# Patient Record
Sex: Female | Born: 1935
Health system: Southern US, Community
[De-identification: ages and names within clinical notes are randomized; demographics above are authoritative.]

## PROBLEM LIST (undated history)

## (undated) DIAGNOSIS — M1710 Unilateral primary osteoarthritis, unspecified knee: Secondary | ICD-10-CM

## (undated) DIAGNOSIS — E538 Deficiency of other specified B group vitamins: Secondary | ICD-10-CM

## (undated) DIAGNOSIS — M179 Osteoarthritis of knee, unspecified: Secondary | ICD-10-CM

## (undated) DIAGNOSIS — E611 Iron deficiency: Secondary | ICD-10-CM

## (undated) DIAGNOSIS — H8109 Meniere's disease, unspecified ear: Secondary | ICD-10-CM

## (undated) DIAGNOSIS — R413 Other amnesia: Secondary | ICD-10-CM

## (undated) DIAGNOSIS — F0280 Dementia in other diseases classified elsewhere without behavioral disturbance: Secondary | ICD-10-CM

## (undated) DIAGNOSIS — R600 Localized edema: Secondary | ICD-10-CM

## (undated) DIAGNOSIS — R296 Repeated falls: Secondary | ICD-10-CM

## (undated) DIAGNOSIS — E079 Disorder of thyroid, unspecified: Secondary | ICD-10-CM

## (undated) DIAGNOSIS — D649 Anemia, unspecified: Secondary | ICD-10-CM

## (undated) DIAGNOSIS — M791 Myalgia, unspecified site: Secondary | ICD-10-CM

## (undated) DIAGNOSIS — I10 Essential (primary) hypertension: Secondary | ICD-10-CM

## (undated) DIAGNOSIS — G309 Alzheimer's disease, unspecified: Secondary | ICD-10-CM

## (undated) DIAGNOSIS — R609 Edema, unspecified: Secondary | ICD-10-CM

## (undated) DIAGNOSIS — C801 Malignant (primary) neoplasm, unspecified: Secondary | ICD-10-CM

## (undated) DIAGNOSIS — C50919 Malignant neoplasm of unspecified site of unspecified female breast: Secondary | ICD-10-CM

## (undated) DIAGNOSIS — M069 Rheumatoid arthritis, unspecified: Secondary | ICD-10-CM

## (undated) DIAGNOSIS — E7800 Pure hypercholesterolemia, unspecified: Secondary | ICD-10-CM

## (undated) HISTORY — DX: Meniere's disease, unspecified ear: H81.09

## (undated) HISTORY — DX: Osteoarthritis of knee, unspecified: M17.9

## (undated) HISTORY — PX: BREAST LUMPECTOMY: SHX2

## (undated) HISTORY — DX: Pure hypercholesterolemia, unspecified: E78.00

## (undated) HISTORY — DX: Other amnesia: R41.3

## (undated) HISTORY — DX: Iron deficiency: E61.1

## (undated) HISTORY — DX: Alzheimer's disease, unspecified: G30.9

## (undated) HISTORY — PX: CATARACT EXTRACTION: SUR2

## (undated) HISTORY — DX: Unilateral primary osteoarthritis, unspecified knee: M17.10

## (undated) HISTORY — DX: Dementia in other diseases classified elsewhere without behavioral disturbance: F02.80

## (undated) HISTORY — DX: Malignant neoplasm of unspecified site of unspecified female breast: C50.919

---

## 2003-07-01 ENCOUNTER — Encounter: Payer: Self-pay | Admitting: Ophthalmology

## 2003-07-01 ENCOUNTER — Ambulatory Visit (HOSPITAL_COMMUNITY): Admission: RE | Admit: 2003-07-01 | Discharge: 2003-07-01 | Payer: Self-pay | Admitting: Ophthalmology

## 2004-06-04 ENCOUNTER — Ambulatory Visit (HOSPITAL_COMMUNITY): Admission: RE | Admit: 2004-06-04 | Discharge: 2004-06-04 | Payer: Self-pay | Admitting: *Deleted

## 2012-01-27 DIAGNOSIS — M899 Disorder of bone, unspecified: Secondary | ICD-10-CM | POA: Diagnosis not present

## 2012-01-27 DIAGNOSIS — M949 Disorder of cartilage, unspecified: Secondary | ICD-10-CM | POA: Diagnosis not present

## 2012-01-27 DIAGNOSIS — N959 Unspecified menopausal and perimenopausal disorder: Secondary | ICD-10-CM | POA: Diagnosis not present

## 2012-03-03 DIAGNOSIS — E559 Vitamin D deficiency, unspecified: Secondary | ICD-10-CM | POA: Diagnosis not present

## 2012-03-03 DIAGNOSIS — I10 Essential (primary) hypertension: Secondary | ICD-10-CM | POA: Diagnosis not present

## 2012-03-03 DIAGNOSIS — M47812 Spondylosis without myelopathy or radiculopathy, cervical region: Secondary | ICD-10-CM | POA: Diagnosis not present

## 2012-03-03 DIAGNOSIS — E785 Hyperlipidemia, unspecified: Secondary | ICD-10-CM | POA: Diagnosis not present

## 2012-03-03 DIAGNOSIS — R079 Chest pain, unspecified: Secondary | ICD-10-CM | POA: Diagnosis not present

## 2012-03-03 DIAGNOSIS — E039 Hypothyroidism, unspecified: Secondary | ICD-10-CM | POA: Diagnosis not present

## 2012-03-03 DIAGNOSIS — Z79899 Other long term (current) drug therapy: Secondary | ICD-10-CM | POA: Diagnosis not present

## 2012-05-05 DIAGNOSIS — N951 Menopausal and female climacteric states: Secondary | ICD-10-CM | POA: Diagnosis not present

## 2012-05-05 DIAGNOSIS — Z09 Encounter for follow-up examination after completed treatment for conditions other than malignant neoplasm: Secondary | ICD-10-CM | POA: Diagnosis not present

## 2012-05-05 DIAGNOSIS — Z923 Personal history of irradiation: Secondary | ICD-10-CM | POA: Diagnosis not present

## 2012-05-05 DIAGNOSIS — E559 Vitamin D deficiency, unspecified: Secondary | ICD-10-CM | POA: Diagnosis not present

## 2012-05-05 DIAGNOSIS — Z9221 Personal history of antineoplastic chemotherapy: Secondary | ICD-10-CM | POA: Diagnosis not present

## 2012-05-05 DIAGNOSIS — M899 Disorder of bone, unspecified: Secondary | ICD-10-CM | POA: Diagnosis not present

## 2012-05-05 DIAGNOSIS — Z79810 Long term (current) use of selective estrogen receptor modulators (SERMs): Secondary | ICD-10-CM | POA: Diagnosis not present

## 2012-05-05 DIAGNOSIS — M949 Disorder of cartilage, unspecified: Secondary | ICD-10-CM | POA: Diagnosis not present

## 2012-05-05 DIAGNOSIS — N644 Mastodynia: Secondary | ICD-10-CM | POA: Diagnosis not present

## 2012-05-05 DIAGNOSIS — Z853 Personal history of malignant neoplasm of breast: Secondary | ICD-10-CM | POA: Diagnosis not present

## 2012-05-05 DIAGNOSIS — Z803 Family history of malignant neoplasm of breast: Secondary | ICD-10-CM | POA: Diagnosis not present

## 2012-05-05 DIAGNOSIS — Z79899 Other long term (current) drug therapy: Secondary | ICD-10-CM | POA: Diagnosis not present

## 2012-05-23 DIAGNOSIS — Z923 Personal history of irradiation: Secondary | ICD-10-CM | POA: Diagnosis not present

## 2012-05-23 DIAGNOSIS — Z853 Personal history of malignant neoplasm of breast: Secondary | ICD-10-CM | POA: Diagnosis not present

## 2012-05-23 DIAGNOSIS — R928 Other abnormal and inconclusive findings on diagnostic imaging of breast: Secondary | ICD-10-CM | POA: Diagnosis not present

## 2012-06-27 DIAGNOSIS — C50919 Malignant neoplasm of unspecified site of unspecified female breast: Secondary | ICD-10-CM | POA: Diagnosis not present

## 2012-06-27 DIAGNOSIS — C50419 Malignant neoplasm of upper-outer quadrant of unspecified female breast: Secondary | ICD-10-CM | POA: Diagnosis not present

## 2012-06-27 DIAGNOSIS — R5381 Other malaise: Secondary | ICD-10-CM | POA: Diagnosis not present

## 2012-06-27 DIAGNOSIS — M949 Disorder of cartilage, unspecified: Secondary | ICD-10-CM | POA: Diagnosis not present

## 2012-06-27 DIAGNOSIS — F411 Generalized anxiety disorder: Secondary | ICD-10-CM | POA: Diagnosis not present

## 2012-06-27 DIAGNOSIS — Z09 Encounter for follow-up examination after completed treatment for conditions other than malignant neoplasm: Secondary | ICD-10-CM | POA: Diagnosis not present

## 2012-06-27 DIAGNOSIS — Z170 Estrogen receptor positive status [ER+]: Secondary | ICD-10-CM | POA: Diagnosis not present

## 2012-06-27 DIAGNOSIS — Z923 Personal history of irradiation: Secondary | ICD-10-CM | POA: Diagnosis not present

## 2012-06-27 DIAGNOSIS — R5383 Other fatigue: Secondary | ICD-10-CM | POA: Diagnosis not present

## 2012-06-27 DIAGNOSIS — Z79810 Long term (current) use of selective estrogen receptor modulators (SERMs): Secondary | ICD-10-CM | POA: Diagnosis not present

## 2012-08-01 DIAGNOSIS — R05 Cough: Secondary | ICD-10-CM | POA: Diagnosis not present

## 2012-08-01 DIAGNOSIS — R0609 Other forms of dyspnea: Secondary | ICD-10-CM | POA: Diagnosis not present

## 2012-08-01 DIAGNOSIS — R059 Cough, unspecified: Secondary | ICD-10-CM | POA: Diagnosis not present

## 2012-08-01 DIAGNOSIS — R32 Unspecified urinary incontinence: Secondary | ICD-10-CM | POA: Diagnosis not present

## 2012-08-01 DIAGNOSIS — D649 Anemia, unspecified: Secondary | ICD-10-CM | POA: Diagnosis not present

## 2012-08-01 DIAGNOSIS — E785 Hyperlipidemia, unspecified: Secondary | ICD-10-CM | POA: Diagnosis not present

## 2012-08-01 DIAGNOSIS — R0989 Other specified symptoms and signs involving the circulatory and respiratory systems: Secondary | ICD-10-CM | POA: Diagnosis not present

## 2012-08-01 DIAGNOSIS — R079 Chest pain, unspecified: Secondary | ICD-10-CM | POA: Diagnosis not present

## 2012-08-11 DIAGNOSIS — R079 Chest pain, unspecified: Secondary | ICD-10-CM | POA: Diagnosis not present

## 2012-08-11 DIAGNOSIS — R0989 Other specified symptoms and signs involving the circulatory and respiratory systems: Secondary | ICD-10-CM | POA: Diagnosis not present

## 2012-08-14 DIAGNOSIS — F411 Generalized anxiety disorder: Secondary | ICD-10-CM | POA: Diagnosis not present

## 2012-08-14 DIAGNOSIS — R0609 Other forms of dyspnea: Secondary | ICD-10-CM | POA: Diagnosis not present

## 2012-09-25 DIAGNOSIS — D649 Anemia, unspecified: Secondary | ICD-10-CM | POA: Diagnosis not present

## 2012-09-27 DIAGNOSIS — D631 Anemia in chronic kidney disease: Secondary | ICD-10-CM | POA: Diagnosis not present

## 2012-09-27 DIAGNOSIS — Z79810 Long term (current) use of selective estrogen receptor modulators (SERMs): Secondary | ICD-10-CM | POA: Diagnosis not present

## 2012-09-27 DIAGNOSIS — C50919 Malignant neoplasm of unspecified site of unspecified female breast: Secondary | ICD-10-CM | POA: Diagnosis not present

## 2012-09-27 DIAGNOSIS — D649 Anemia, unspecified: Secondary | ICD-10-CM | POA: Diagnosis not present

## 2012-09-27 DIAGNOSIS — Z170 Estrogen receptor positive status [ER+]: Secondary | ICD-10-CM | POA: Diagnosis not present

## 2012-09-27 DIAGNOSIS — Z923 Personal history of irradiation: Secondary | ICD-10-CM | POA: Diagnosis not present

## 2012-09-27 DIAGNOSIS — Z853 Personal history of malignant neoplasm of breast: Secondary | ICD-10-CM | POA: Diagnosis not present

## 2012-10-09 DIAGNOSIS — H40009 Preglaucoma, unspecified, unspecified eye: Secondary | ICD-10-CM | POA: Diagnosis not present

## 2012-11-08 DIAGNOSIS — R071 Chest pain on breathing: Secondary | ICD-10-CM | POA: Diagnosis not present

## 2012-12-22 DIAGNOSIS — C50919 Malignant neoplasm of unspecified site of unspecified female breast: Secondary | ICD-10-CM | POA: Diagnosis not present

## 2013-01-03 DIAGNOSIS — N039 Chronic nephritic syndrome with unspecified morphologic changes: Secondary | ICD-10-CM | POA: Diagnosis not present

## 2013-01-03 DIAGNOSIS — Z79810 Long term (current) use of selective estrogen receptor modulators (SERMs): Secondary | ICD-10-CM | POA: Diagnosis not present

## 2013-01-03 DIAGNOSIS — C50919 Malignant neoplasm of unspecified site of unspecified female breast: Secondary | ICD-10-CM | POA: Diagnosis not present

## 2013-01-03 DIAGNOSIS — D631 Anemia in chronic kidney disease: Secondary | ICD-10-CM | POA: Diagnosis not present

## 2013-01-03 DIAGNOSIS — Z923 Personal history of irradiation: Secondary | ICD-10-CM | POA: Diagnosis not present

## 2013-01-03 DIAGNOSIS — Z170 Estrogen receptor positive status [ER+]: Secondary | ICD-10-CM | POA: Diagnosis not present

## 2013-02-13 DIAGNOSIS — E785 Hyperlipidemia, unspecified: Secondary | ICD-10-CM | POA: Diagnosis not present

## 2013-02-13 DIAGNOSIS — I10 Essential (primary) hypertension: Secondary | ICD-10-CM | POA: Diagnosis not present

## 2013-02-13 DIAGNOSIS — E559 Vitamin D deficiency, unspecified: Secondary | ICD-10-CM | POA: Diagnosis not present

## 2013-05-09 DIAGNOSIS — M949 Disorder of cartilage, unspecified: Secondary | ICD-10-CM | POA: Diagnosis not present

## 2013-05-09 DIAGNOSIS — M19019 Primary osteoarthritis, unspecified shoulder: Secondary | ICD-10-CM | POA: Diagnosis not present

## 2013-05-09 DIAGNOSIS — D631 Anemia in chronic kidney disease: Secondary | ICD-10-CM | POA: Diagnosis not present

## 2013-05-09 DIAGNOSIS — N039 Chronic nephritic syndrome with unspecified morphologic changes: Secondary | ICD-10-CM | POA: Diagnosis not present

## 2013-05-09 DIAGNOSIS — D649 Anemia, unspecified: Secondary | ICD-10-CM | POA: Diagnosis not present

## 2013-05-09 DIAGNOSIS — D6489 Other specified anemias: Secondary | ICD-10-CM | POA: Diagnosis not present

## 2013-05-09 DIAGNOSIS — C50919 Malignant neoplasm of unspecified site of unspecified female breast: Secondary | ICD-10-CM | POA: Diagnosis not present

## 2013-05-09 DIAGNOSIS — Z79810 Long term (current) use of selective estrogen receptor modulators (SERMs): Secondary | ICD-10-CM | POA: Diagnosis not present

## 2013-05-09 DIAGNOSIS — M25519 Pain in unspecified shoulder: Secondary | ICD-10-CM | POA: Diagnosis not present

## 2013-05-09 DIAGNOSIS — Z09 Encounter for follow-up examination after completed treatment for conditions other than malignant neoplasm: Secondary | ICD-10-CM | POA: Diagnosis not present

## 2013-08-14 DIAGNOSIS — Z853 Personal history of malignant neoplasm of breast: Secondary | ICD-10-CM | POA: Diagnosis not present

## 2013-08-14 DIAGNOSIS — D631 Anemia in chronic kidney disease: Secondary | ICD-10-CM | POA: Diagnosis not present

## 2013-08-14 DIAGNOSIS — Z09 Encounter for follow-up examination after completed treatment for conditions other than malignant neoplasm: Secondary | ICD-10-CM | POA: Diagnosis not present

## 2013-08-14 DIAGNOSIS — Z79810 Long term (current) use of selective estrogen receptor modulators (SERMs): Secondary | ICD-10-CM | POA: Diagnosis not present

## 2013-08-14 DIAGNOSIS — Z170 Estrogen receptor positive status [ER+]: Secondary | ICD-10-CM | POA: Diagnosis not present

## 2013-08-14 DIAGNOSIS — Z9889 Other specified postprocedural states: Secondary | ICD-10-CM | POA: Diagnosis not present

## 2013-08-14 DIAGNOSIS — Z923 Personal history of irradiation: Secondary | ICD-10-CM | POA: Diagnosis not present

## 2013-08-15 DIAGNOSIS — Z853 Personal history of malignant neoplasm of breast: Secondary | ICD-10-CM | POA: Diagnosis not present

## 2013-08-15 DIAGNOSIS — D631 Anemia in chronic kidney disease: Secondary | ICD-10-CM | POA: Diagnosis not present

## 2013-08-15 DIAGNOSIS — Z79810 Long term (current) use of selective estrogen receptor modulators (SERMs): Secondary | ICD-10-CM | POA: Diagnosis not present

## 2013-08-15 DIAGNOSIS — Z170 Estrogen receptor positive status [ER+]: Secondary | ICD-10-CM | POA: Diagnosis not present

## 2013-08-15 DIAGNOSIS — C50919 Malignant neoplasm of unspecified site of unspecified female breast: Secondary | ICD-10-CM | POA: Diagnosis not present

## 2013-08-15 DIAGNOSIS — Z09 Encounter for follow-up examination after completed treatment for conditions other than malignant neoplasm: Secondary | ICD-10-CM | POA: Diagnosis not present

## 2013-08-21 DIAGNOSIS — I10 Essential (primary) hypertension: Secondary | ICD-10-CM | POA: Diagnosis not present

## 2013-08-21 DIAGNOSIS — E039 Hypothyroidism, unspecified: Secondary | ICD-10-CM | POA: Diagnosis not present

## 2013-08-21 DIAGNOSIS — D649 Anemia, unspecified: Secondary | ICD-10-CM | POA: Diagnosis not present

## 2013-08-21 DIAGNOSIS — E559 Vitamin D deficiency, unspecified: Secondary | ICD-10-CM | POA: Diagnosis not present

## 2013-08-21 DIAGNOSIS — E785 Hyperlipidemia, unspecified: Secondary | ICD-10-CM | POA: Diagnosis not present

## 2013-10-05 DIAGNOSIS — M1710 Unilateral primary osteoarthritis, unspecified knee: Secondary | ICD-10-CM | POA: Diagnosis not present

## 2013-10-05 DIAGNOSIS — S79919A Unspecified injury of unspecified hip, initial encounter: Secondary | ICD-10-CM | POA: Diagnosis not present

## 2013-10-05 DIAGNOSIS — G8911 Acute pain due to trauma: Secondary | ICD-10-CM | POA: Diagnosis not present

## 2013-10-05 DIAGNOSIS — S83006A Unspecified dislocation of unspecified patella, initial encounter: Secondary | ICD-10-CM | POA: Diagnosis not present

## 2013-10-05 DIAGNOSIS — M25559 Pain in unspecified hip: Secondary | ICD-10-CM | POA: Diagnosis not present

## 2013-10-05 DIAGNOSIS — M25569 Pain in unspecified knee: Secondary | ICD-10-CM | POA: Diagnosis not present

## 2013-10-09 DIAGNOSIS — M1710 Unilateral primary osteoarthritis, unspecified knee: Secondary | ICD-10-CM | POA: Diagnosis not present

## 2013-10-16 DIAGNOSIS — M1710 Unilateral primary osteoarthritis, unspecified knee: Secondary | ICD-10-CM | POA: Diagnosis not present

## 2013-10-20 DIAGNOSIS — N189 Chronic kidney disease, unspecified: Secondary | ICD-10-CM | POA: Diagnosis not present

## 2013-10-20 DIAGNOSIS — R079 Chest pain, unspecified: Secondary | ICD-10-CM | POA: Diagnosis not present

## 2013-10-20 DIAGNOSIS — R071 Chest pain on breathing: Secondary | ICD-10-CM | POA: Diagnosis not present

## 2013-10-20 DIAGNOSIS — I129 Hypertensive chronic kidney disease with stage 1 through stage 4 chronic kidney disease, or unspecified chronic kidney disease: Secondary | ICD-10-CM | POA: Diagnosis not present

## 2013-10-20 DIAGNOSIS — E039 Hypothyroidism, unspecified: Secondary | ICD-10-CM | POA: Diagnosis not present

## 2013-10-23 DIAGNOSIS — M1710 Unilateral primary osteoarthritis, unspecified knee: Secondary | ICD-10-CM | POA: Diagnosis not present

## 2013-11-08 DIAGNOSIS — C50919 Malignant neoplasm of unspecified site of unspecified female breast: Secondary | ICD-10-CM | POA: Diagnosis not present

## 2013-11-08 DIAGNOSIS — Z853 Personal history of malignant neoplasm of breast: Secondary | ICD-10-CM | POA: Diagnosis not present

## 2013-11-08 DIAGNOSIS — Z803 Family history of malignant neoplasm of breast: Secondary | ICD-10-CM | POA: Diagnosis not present

## 2013-11-08 DIAGNOSIS — N644 Mastodynia: Secondary | ICD-10-CM | POA: Diagnosis not present

## 2013-11-08 DIAGNOSIS — M899 Disorder of bone, unspecified: Secondary | ICD-10-CM | POA: Diagnosis not present

## 2013-11-08 DIAGNOSIS — E559 Vitamin D deficiency, unspecified: Secondary | ICD-10-CM | POA: Diagnosis not present

## 2013-11-26 DIAGNOSIS — N644 Mastodynia: Secondary | ICD-10-CM | POA: Diagnosis not present

## 2013-11-26 DIAGNOSIS — Z9889 Other specified postprocedural states: Secondary | ICD-10-CM | POA: Diagnosis not present

## 2013-11-26 DIAGNOSIS — Z853 Personal history of malignant neoplasm of breast: Secondary | ICD-10-CM | POA: Diagnosis not present

## 2013-11-26 DIAGNOSIS — Z803 Family history of malignant neoplasm of breast: Secondary | ICD-10-CM | POA: Diagnosis not present

## 2013-12-10 DIAGNOSIS — Z170 Estrogen receptor positive status [ER+]: Secondary | ICD-10-CM | POA: Diagnosis not present

## 2013-12-10 DIAGNOSIS — Z9889 Other specified postprocedural states: Secondary | ICD-10-CM | POA: Diagnosis not present

## 2013-12-10 DIAGNOSIS — Z853 Personal history of malignant neoplasm of breast: Secondary | ICD-10-CM | POA: Diagnosis not present

## 2013-12-10 DIAGNOSIS — N183 Chronic kidney disease, stage 3 unspecified: Secondary | ICD-10-CM | POA: Diagnosis not present

## 2013-12-10 DIAGNOSIS — Z09 Encounter for follow-up examination after completed treatment for conditions other than malignant neoplasm: Secondary | ICD-10-CM | POA: Diagnosis not present

## 2013-12-10 DIAGNOSIS — Z79810 Long term (current) use of selective estrogen receptor modulators (SERMs): Secondary | ICD-10-CM | POA: Diagnosis not present

## 2013-12-10 DIAGNOSIS — Z923 Personal history of irradiation: Secondary | ICD-10-CM | POA: Diagnosis not present

## 2013-12-10 DIAGNOSIS — D539 Nutritional anemia, unspecified: Secondary | ICD-10-CM | POA: Diagnosis not present

## 2013-12-11 DIAGNOSIS — Z170 Estrogen receptor positive status [ER+]: Secondary | ICD-10-CM | POA: Diagnosis not present

## 2013-12-11 DIAGNOSIS — C50919 Malignant neoplasm of unspecified site of unspecified female breast: Secondary | ICD-10-CM | POA: Diagnosis not present

## 2013-12-11 DIAGNOSIS — Z853 Personal history of malignant neoplasm of breast: Secondary | ICD-10-CM | POA: Diagnosis not present

## 2013-12-11 DIAGNOSIS — Z09 Encounter for follow-up examination after completed treatment for conditions other than malignant neoplasm: Secondary | ICD-10-CM | POA: Diagnosis not present

## 2013-12-11 DIAGNOSIS — N183 Chronic kidney disease, stage 3 unspecified: Secondary | ICD-10-CM | POA: Diagnosis not present

## 2013-12-11 DIAGNOSIS — Z79810 Long term (current) use of selective estrogen receptor modulators (SERMs): Secondary | ICD-10-CM | POA: Diagnosis not present

## 2013-12-11 DIAGNOSIS — D539 Nutritional anemia, unspecified: Secondary | ICD-10-CM | POA: Diagnosis not present

## 2014-02-13 DIAGNOSIS — E039 Hypothyroidism, unspecified: Secondary | ICD-10-CM | POA: Diagnosis not present

## 2014-02-13 DIAGNOSIS — Z923 Personal history of irradiation: Secondary | ICD-10-CM | POA: Diagnosis not present

## 2014-02-13 DIAGNOSIS — E8941 Symptomatic postprocedural ovarian failure: Secondary | ICD-10-CM | POA: Diagnosis not present

## 2014-02-13 DIAGNOSIS — C50919 Malignant neoplasm of unspecified site of unspecified female breast: Secondary | ICD-10-CM | POA: Diagnosis not present

## 2014-02-13 DIAGNOSIS — Z79899 Other long term (current) drug therapy: Secondary | ICD-10-CM | POA: Diagnosis not present

## 2014-02-13 DIAGNOSIS — N644 Mastodynia: Secondary | ICD-10-CM | POA: Diagnosis not present

## 2014-02-13 DIAGNOSIS — E559 Vitamin D deficiency, unspecified: Secondary | ICD-10-CM | POA: Diagnosis not present

## 2014-02-13 DIAGNOSIS — I10 Essential (primary) hypertension: Secondary | ICD-10-CM | POA: Diagnosis not present

## 2014-02-13 DIAGNOSIS — E785 Hyperlipidemia, unspecified: Secondary | ICD-10-CM | POA: Diagnosis not present

## 2014-02-13 DIAGNOSIS — Z853 Personal history of malignant neoplasm of breast: Secondary | ICD-10-CM | POA: Diagnosis not present

## 2014-02-13 DIAGNOSIS — Z780 Asymptomatic menopausal state: Secondary | ICD-10-CM | POA: Diagnosis not present

## 2014-02-13 DIAGNOSIS — M899 Disorder of bone, unspecified: Secondary | ICD-10-CM | POA: Diagnosis not present

## 2014-02-13 DIAGNOSIS — Z79810 Long term (current) use of selective estrogen receptor modulators (SERMs): Secondary | ICD-10-CM | POA: Diagnosis not present

## 2014-02-13 DIAGNOSIS — Z803 Family history of malignant neoplasm of breast: Secondary | ICD-10-CM | POA: Diagnosis not present

## 2014-02-21 DIAGNOSIS — E039 Hypothyroidism, unspecified: Secondary | ICD-10-CM | POA: Diagnosis not present

## 2014-02-21 DIAGNOSIS — I10 Essential (primary) hypertension: Secondary | ICD-10-CM | POA: Diagnosis not present

## 2014-02-21 DIAGNOSIS — E785 Hyperlipidemia, unspecified: Secondary | ICD-10-CM | POA: Diagnosis not present

## 2014-02-21 DIAGNOSIS — D649 Anemia, unspecified: Secondary | ICD-10-CM | POA: Diagnosis not present

## 2014-06-14 DIAGNOSIS — Z5181 Encounter for therapeutic drug level monitoring: Secondary | ICD-10-CM | POA: Diagnosis not present

## 2014-06-14 DIAGNOSIS — N644 Mastodynia: Secondary | ICD-10-CM | POA: Diagnosis not present

## 2014-06-14 DIAGNOSIS — M129 Arthropathy, unspecified: Secondary | ICD-10-CM | POA: Diagnosis not present

## 2014-06-14 DIAGNOSIS — Z79899 Other long term (current) drug therapy: Secondary | ICD-10-CM | POA: Diagnosis not present

## 2014-06-16 DIAGNOSIS — M1990 Unspecified osteoarthritis, unspecified site: Secondary | ICD-10-CM | POA: Insufficient documentation

## 2014-06-16 DIAGNOSIS — E611 Iron deficiency: Secondary | ICD-10-CM | POA: Insufficient documentation

## 2014-06-16 DIAGNOSIS — E031 Congenital hypothyroidism without goiter: Secondary | ICD-10-CM | POA: Insufficient documentation

## 2014-06-16 DIAGNOSIS — E785 Hyperlipidemia, unspecified: Secondary | ICD-10-CM | POA: Insufficient documentation

## 2014-06-16 DIAGNOSIS — Z853 Personal history of malignant neoplasm of breast: Secondary | ICD-10-CM | POA: Insufficient documentation

## 2014-06-18 ENCOUNTER — Telehealth: Payer: Self-pay | Admitting: *Deleted

## 2014-06-18 NOTE — Telephone Encounter (Signed)
Left message for a return phone call to get more information to get her scheduled here at the cancer center.  Awaiting patient response.

## 2014-06-19 ENCOUNTER — Encounter: Payer: Self-pay | Admitting: *Deleted

## 2014-06-19 NOTE — Progress Notes (Signed)
Mailed patient medical release form to sign.  Informed her I cannot get any of her records without this signed.  She would prefer that I mail this due to transportation issues.

## 2014-07-08 DIAGNOSIS — N644 Mastodynia: Secondary | ICD-10-CM | POA: Diagnosis not present

## 2014-07-08 DIAGNOSIS — Z853 Personal history of malignant neoplasm of breast: Secondary | ICD-10-CM | POA: Diagnosis not present

## 2014-07-12 ENCOUNTER — Telehealth: Payer: Self-pay | Admitting: *Deleted

## 2014-07-12 NOTE — Telephone Encounter (Signed)
Received records we needed.  Called pt and confirmed 07/19/14 appt w/ pt.  Mailed welcoming packet & intake form to pt.  Took paperwork to HIM to create chart.

## 2014-07-15 ENCOUNTER — Telehealth: Payer: Self-pay | Admitting: *Deleted

## 2014-07-15 NOTE — Telephone Encounter (Signed)
Pt called stating that she wants to cancel her appt for Friday and go back to her see Primary first.  She will call back in a couple of weeks to reschedule.  Cancelled appt as requested by pt and got chart from Med [facility] and filled.

## 2014-07-19 ENCOUNTER — Ambulatory Visit: Payer: Self-pay | Admitting: Hematology and Oncology

## 2014-07-19 ENCOUNTER — Ambulatory Visit: Payer: Self-pay

## 2014-09-18 DIAGNOSIS — M25561 Pain in right knee: Secondary | ICD-10-CM | POA: Diagnosis not present

## 2014-09-18 DIAGNOSIS — M25562 Pain in left knee: Secondary | ICD-10-CM | POA: Diagnosis not present

## 2014-09-18 DIAGNOSIS — M170 Bilateral primary osteoarthritis of knee: Secondary | ICD-10-CM | POA: Diagnosis not present

## 2014-09-27 DIAGNOSIS — M170 Bilateral primary osteoarthritis of knee: Secondary | ICD-10-CM | POA: Diagnosis not present

## 2014-09-27 DIAGNOSIS — R0781 Pleurodynia: Secondary | ICD-10-CM | POA: Diagnosis not present

## 2014-10-03 DIAGNOSIS — M170 Bilateral primary osteoarthritis of knee: Secondary | ICD-10-CM | POA: Diagnosis not present

## 2014-12-06 ENCOUNTER — Telehealth: Payer: Self-pay | Admitting: *Deleted

## 2014-12-06 NOTE — Telephone Encounter (Signed)
Receive phone call from patient requesting her medical records be mailed to her house. She is going to be seeing a new physician but she is not sure who that will be at this point.  Request given to medical records to complete.

## 2015-04-02 DIAGNOSIS — M170 Bilateral primary osteoarthritis of knee: Secondary | ICD-10-CM | POA: Diagnosis not present

## 2015-04-11 DIAGNOSIS — M170 Bilateral primary osteoarthritis of knee: Secondary | ICD-10-CM | POA: Diagnosis not present

## 2015-04-16 DIAGNOSIS — M170 Bilateral primary osteoarthritis of knee: Secondary | ICD-10-CM | POA: Diagnosis not present

## 2015-04-18 DIAGNOSIS — D509 Iron deficiency anemia, unspecified: Secondary | ICD-10-CM | POA: Diagnosis not present

## 2015-04-18 DIAGNOSIS — M179 Osteoarthritis of knee, unspecified: Secondary | ICD-10-CM | POA: Diagnosis not present

## 2015-04-18 DIAGNOSIS — R5383 Other fatigue: Secondary | ICD-10-CM | POA: Diagnosis not present

## 2015-04-18 DIAGNOSIS — E785 Hyperlipidemia, unspecified: Secondary | ICD-10-CM | POA: Diagnosis not present

## 2015-04-18 DIAGNOSIS — H8109 Meniere's disease, unspecified ear: Secondary | ICD-10-CM | POA: Diagnosis not present

## 2015-04-18 DIAGNOSIS — E039 Hypothyroidism, unspecified: Secondary | ICD-10-CM | POA: Diagnosis not present

## 2015-04-18 DIAGNOSIS — F411 Generalized anxiety disorder: Secondary | ICD-10-CM | POA: Diagnosis not present

## 2015-04-18 DIAGNOSIS — I10 Essential (primary) hypertension: Secondary | ICD-10-CM | POA: Diagnosis not present

## 2015-04-18 DIAGNOSIS — L989 Disorder of the skin and subcutaneous tissue, unspecified: Secondary | ICD-10-CM | POA: Diagnosis not present

## 2015-04-18 DIAGNOSIS — E611 Iron deficiency: Secondary | ICD-10-CM | POA: Diagnosis not present

## 2015-05-23 DIAGNOSIS — F411 Generalized anxiety disorder: Secondary | ICD-10-CM | POA: Diagnosis not present

## 2015-05-23 DIAGNOSIS — I10 Essential (primary) hypertension: Secondary | ICD-10-CM | POA: Diagnosis not present

## 2015-05-23 DIAGNOSIS — E039 Hypothyroidism, unspecified: Secondary | ICD-10-CM | POA: Diagnosis not present

## 2015-05-23 DIAGNOSIS — M179 Osteoarthritis of knee, unspecified: Secondary | ICD-10-CM | POA: Diagnosis not present

## 2015-06-18 DIAGNOSIS — E039 Hypothyroidism, unspecified: Secondary | ICD-10-CM | POA: Diagnosis not present

## 2015-09-16 DIAGNOSIS — R5383 Other fatigue: Secondary | ICD-10-CM | POA: Diagnosis not present

## 2015-09-16 DIAGNOSIS — D649 Anemia, unspecified: Secondary | ICD-10-CM | POA: Diagnosis not present

## 2015-09-16 DIAGNOSIS — N644 Mastodynia: Secondary | ICD-10-CM | POA: Diagnosis not present

## 2015-09-16 DIAGNOSIS — E039 Hypothyroidism, unspecified: Secondary | ICD-10-CM | POA: Diagnosis not present

## 2015-09-16 DIAGNOSIS — Z862 Personal history of diseases of the blood and blood-forming organs and certain disorders involving the immune mechanism: Secondary | ICD-10-CM | POA: Diagnosis not present

## 2015-09-17 ENCOUNTER — Other Ambulatory Visit: Payer: Self-pay | Admitting: Family Medicine

## 2015-09-17 DIAGNOSIS — Z853 Personal history of malignant neoplasm of breast: Secondary | ICD-10-CM

## 2015-09-17 DIAGNOSIS — N644 Mastodynia: Secondary | ICD-10-CM

## 2015-09-22 ENCOUNTER — Other Ambulatory Visit: Payer: Self-pay

## 2015-09-24 ENCOUNTER — Ambulatory Visit
Admission: RE | Admit: 2015-09-24 | Discharge: 2015-09-24 | Disposition: A | Discharge status: Home / Self Care | Payer: Medicare Other | Source: Ambulatory Visit | Attending: Family Medicine | Admitting: Family Medicine

## 2015-09-24 DIAGNOSIS — N6489 Other specified disorders of breast: Secondary | ICD-10-CM | POA: Diagnosis not present

## 2015-09-24 DIAGNOSIS — R928 Other abnormal and inconclusive findings on diagnostic imaging of breast: Secondary | ICD-10-CM | POA: Diagnosis not present

## 2015-09-24 DIAGNOSIS — N644 Mastodynia: Secondary | ICD-10-CM

## 2015-09-24 DIAGNOSIS — Z853 Personal history of malignant neoplasm of breast: Secondary | ICD-10-CM

## 2015-11-10 DIAGNOSIS — M170 Bilateral primary osteoarthritis of knee: Secondary | ICD-10-CM | POA: Diagnosis not present

## 2015-11-17 DIAGNOSIS — M170 Bilateral primary osteoarthritis of knee: Secondary | ICD-10-CM | POA: Diagnosis not present

## 2015-11-24 DIAGNOSIS — M170 Bilateral primary osteoarthritis of knee: Secondary | ICD-10-CM | POA: Diagnosis not present

## 2016-05-25 DIAGNOSIS — M170 Bilateral primary osteoarthritis of knee: Secondary | ICD-10-CM | POA: Diagnosis not present

## 2016-06-03 DIAGNOSIS — M170 Bilateral primary osteoarthritis of knee: Secondary | ICD-10-CM | POA: Diagnosis not present

## 2016-06-09 DIAGNOSIS — M170 Bilateral primary osteoarthritis of knee: Secondary | ICD-10-CM | POA: Diagnosis not present

## 2016-07-08 DIAGNOSIS — H8109 Meniere's disease, unspecified ear: Secondary | ICD-10-CM | POA: Diagnosis not present

## 2016-07-08 DIAGNOSIS — E039 Hypothyroidism, unspecified: Secondary | ICD-10-CM | POA: Diagnosis not present

## 2016-07-08 DIAGNOSIS — R413 Other amnesia: Secondary | ICD-10-CM | POA: Diagnosis not present

## 2016-07-08 DIAGNOSIS — F411 Generalized anxiety disorder: Secondary | ICD-10-CM | POA: Diagnosis not present

## 2016-07-08 DIAGNOSIS — Z862 Personal history of diseases of the blood and blood-forming organs and certain disorders involving the immune mechanism: Secondary | ICD-10-CM | POA: Diagnosis not present

## 2016-07-08 DIAGNOSIS — E785 Hyperlipidemia, unspecified: Secondary | ICD-10-CM | POA: Diagnosis not present

## 2016-07-08 DIAGNOSIS — E538 Deficiency of other specified B group vitamins: Secondary | ICD-10-CM | POA: Diagnosis not present

## 2016-10-08 ENCOUNTER — Ambulatory Visit
Admission: RE | Admit: 2016-10-08 | Discharge: 2016-10-08 | Disposition: A | Discharge status: Home / Self Care | Payer: Medicare Other | Source: Ambulatory Visit | Attending: Family Medicine | Admitting: Family Medicine

## 2016-10-08 ENCOUNTER — Other Ambulatory Visit: Payer: Self-pay | Admitting: Family Medicine

## 2016-10-08 DIAGNOSIS — Z853 Personal history of malignant neoplasm of breast: Secondary | ICD-10-CM | POA: Diagnosis not present

## 2016-10-08 DIAGNOSIS — E039 Hypothyroidism, unspecified: Secondary | ICD-10-CM | POA: Diagnosis not present

## 2016-10-08 DIAGNOSIS — S32020A Wedge compression fracture of second lumbar vertebra, initial encounter for closed fracture: Secondary | ICD-10-CM | POA: Diagnosis not present

## 2016-10-08 DIAGNOSIS — M549 Dorsalgia, unspecified: Secondary | ICD-10-CM

## 2016-10-08 DIAGNOSIS — E538 Deficiency of other specified B group vitamins: Secondary | ICD-10-CM | POA: Diagnosis not present

## 2016-10-08 DIAGNOSIS — D649 Anemia, unspecified: Secondary | ICD-10-CM | POA: Diagnosis not present

## 2016-10-08 DIAGNOSIS — H8109 Meniere's disease, unspecified ear: Secondary | ICD-10-CM | POA: Diagnosis not present

## 2016-10-08 DIAGNOSIS — R600 Localized edema: Secondary | ICD-10-CM | POA: Diagnosis not present

## 2016-10-08 DIAGNOSIS — W19XXXA Unspecified fall, initial encounter: Secondary | ICD-10-CM | POA: Diagnosis not present

## 2016-10-08 DIAGNOSIS — R079 Chest pain, unspecified: Secondary | ICD-10-CM | POA: Diagnosis not present

## 2016-10-12 ENCOUNTER — Encounter: Payer: Self-pay | Admitting: Hematology and Oncology

## 2016-10-12 ENCOUNTER — Telehealth: Payer: Self-pay | Admitting: Hematology and Oncology

## 2016-10-12 NOTE — Telephone Encounter (Signed)
Telephone call to schedule pt an appt. Referral received from [facility] . I explained the reason for the referral to the pt, which was so that she can establish care with an oncologist. Pt voiced understanding. An appt was made w/Gudena on 12/02/16 at 1pm per pt request. She didn't want appt earlier bc she wanted to be able to enjoy the holidays. Demographics verified. Letter and calendar were mailed to the pt.

## 2016-12-01 ENCOUNTER — Telehealth: Payer: Self-pay | Admitting: Hematology and Oncology

## 2016-12-01 ENCOUNTER — Encounter: Payer: Self-pay | Admitting: Hematology and Oncology

## 2016-12-01 NOTE — Telephone Encounter (Signed)
Pt cld to reschedule appt from 12/02/16 to 03/29/17 w/Gudena. She states that she is not having any problems. Reason for the referral was to get her established with a MD here in [place]. Pt was treated as a br[facility] pt at [facility]. Will mail a new letter w/appt date and time.

## 2016-12-02 ENCOUNTER — Ambulatory Visit: Payer: Medicare Other | Admitting: Hematology and Oncology

## 2016-12-06 ENCOUNTER — Telehealth: Payer: Self-pay | Admitting: Hematology and Oncology

## 2016-12-06 NOTE — Telephone Encounter (Signed)
Patient called to cancel her appointment on 03/29/17.  She said she would not be available and would call back when she could come.

## 2017-01-07 DIAGNOSIS — H8109 Meniere's disease, unspecified ear: Secondary | ICD-10-CM | POA: Diagnosis not present

## 2017-01-07 DIAGNOSIS — R413 Other amnesia: Secondary | ICD-10-CM | POA: Diagnosis not present

## 2017-01-07 DIAGNOSIS — I10 Essential (primary) hypertension: Secondary | ICD-10-CM | POA: Diagnosis not present

## 2017-01-07 DIAGNOSIS — R600 Localized edema: Secondary | ICD-10-CM | POA: Diagnosis not present

## 2017-01-07 DIAGNOSIS — D649 Anemia, unspecified: Secondary | ICD-10-CM | POA: Diagnosis not present

## 2017-01-07 DIAGNOSIS — E538 Deficiency of other specified B group vitamins: Secondary | ICD-10-CM | POA: Diagnosis not present

## 2017-01-07 DIAGNOSIS — Z23 Encounter for immunization: Secondary | ICD-10-CM | POA: Diagnosis not present

## 2017-01-07 DIAGNOSIS — Z853 Personal history of malignant neoplasm of breast: Secondary | ICD-10-CM | POA: Diagnosis not present

## 2017-01-07 DIAGNOSIS — E039 Hypothyroidism, unspecified: Secondary | ICD-10-CM | POA: Diagnosis not present

## 2017-01-07 DIAGNOSIS — M549 Dorsalgia, unspecified: Secondary | ICD-10-CM | POA: Diagnosis not present

## 2017-03-01 DIAGNOSIS — M25571 Pain in right ankle and joints of right foot: Secondary | ICD-10-CM | POA: Diagnosis not present

## 2017-03-01 DIAGNOSIS — M79661 Pain in right lower leg: Secondary | ICD-10-CM | POA: Diagnosis not present

## 2017-03-02 ENCOUNTER — Other Ambulatory Visit (HOSPITAL_COMMUNITY): Payer: Self-pay | Admitting: Sports Medicine

## 2017-03-02 ENCOUNTER — Ambulatory Visit (HOSPITAL_COMMUNITY)
Admission: RE | Admit: 2017-03-02 | Discharge: 2017-03-02 | Disposition: A | Discharge status: Home / Self Care | Payer: Medicare Other | Source: Ambulatory Visit | Attending: Cardiology | Admitting: Cardiology

## 2017-03-02 DIAGNOSIS — M79671 Pain in right foot: Secondary | ICD-10-CM | POA: Insufficient documentation

## 2017-03-02 DIAGNOSIS — R52 Pain, unspecified: Secondary | ICD-10-CM

## 2017-03-02 DIAGNOSIS — M7989 Other specified soft tissue disorders: Secondary | ICD-10-CM | POA: Diagnosis not present

## 2017-03-29 ENCOUNTER — Ambulatory Visit: Payer: Medicare Other | Admitting: Hematology and Oncology

## 2017-04-07 DIAGNOSIS — E538 Deficiency of other specified B group vitamins: Secondary | ICD-10-CM | POA: Diagnosis not present

## 2017-04-07 DIAGNOSIS — E039 Hypothyroidism, unspecified: Secondary | ICD-10-CM | POA: Diagnosis not present

## 2017-04-07 DIAGNOSIS — R600 Localized edema: Secondary | ICD-10-CM | POA: Diagnosis not present

## 2017-04-07 DIAGNOSIS — I10 Essential (primary) hypertension: Secondary | ICD-10-CM | POA: Diagnosis not present

## 2017-04-07 DIAGNOSIS — H8109 Meniere's disease, unspecified ear: Secondary | ICD-10-CM | POA: Diagnosis not present

## 2017-04-07 DIAGNOSIS — R413 Other amnesia: Secondary | ICD-10-CM | POA: Diagnosis not present

## 2017-04-07 DIAGNOSIS — D649 Anemia, unspecified: Secondary | ICD-10-CM | POA: Diagnosis not present

## 2017-04-14 DIAGNOSIS — M170 Bilateral primary osteoarthritis of knee: Secondary | ICD-10-CM | POA: Diagnosis not present

## 2017-04-21 DIAGNOSIS — M170 Bilateral primary osteoarthritis of knee: Secondary | ICD-10-CM | POA: Diagnosis not present

## 2017-04-28 DIAGNOSIS — M170 Bilateral primary osteoarthritis of knee: Secondary | ICD-10-CM | POA: Diagnosis not present

## 2017-05-03 DIAGNOSIS — E039 Hypothyroidism, unspecified: Secondary | ICD-10-CM | POA: Diagnosis not present

## 2017-05-03 DIAGNOSIS — R531 Weakness: Secondary | ICD-10-CM | POA: Diagnosis not present

## 2017-05-03 DIAGNOSIS — E538 Deficiency of other specified B group vitamins: Secondary | ICD-10-CM | POA: Diagnosis not present

## 2017-05-03 DIAGNOSIS — D649 Anemia, unspecified: Secondary | ICD-10-CM | POA: Diagnosis not present

## 2017-05-03 DIAGNOSIS — N390 Urinary tract infection, site not specified: Secondary | ICD-10-CM | POA: Diagnosis not present

## 2017-05-03 DIAGNOSIS — I499 Cardiac arrhythmia, unspecified: Secondary | ICD-10-CM | POA: Diagnosis not present

## 2017-05-03 DIAGNOSIS — R634 Abnormal weight loss: Secondary | ICD-10-CM | POA: Diagnosis not present

## 2017-05-03 DIAGNOSIS — R600 Localized edema: Secondary | ICD-10-CM | POA: Diagnosis not present

## 2017-05-03 DIAGNOSIS — M791 Myalgia: Secondary | ICD-10-CM | POA: Diagnosis not present

## 2017-05-03 DIAGNOSIS — M1990 Unspecified osteoarthritis, unspecified site: Secondary | ICD-10-CM | POA: Diagnosis not present

## 2017-05-05 ENCOUNTER — Emergency Department (HOSPITAL_COMMUNITY): Payer: Medicare Other

## 2017-05-05 ENCOUNTER — Emergency Department (HOSPITAL_COMMUNITY)
Admission: EM | Admit: 2017-05-05 | Discharge: 2017-05-05 | Disposition: A | Discharge status: Home / Self Care | Payer: Medicare Other | Attending: Emergency Medicine | Admitting: Emergency Medicine

## 2017-05-05 ENCOUNTER — Telehealth: Payer: Self-pay

## 2017-05-05 ENCOUNTER — Encounter (HOSPITAL_COMMUNITY): Payer: Self-pay | Admitting: Emergency Medicine

## 2017-05-05 DIAGNOSIS — R4182 Altered mental status, unspecified: Secondary | ICD-10-CM | POA: Diagnosis not present

## 2017-05-05 DIAGNOSIS — R600 Localized edema: Secondary | ICD-10-CM | POA: Diagnosis not present

## 2017-05-05 DIAGNOSIS — R531 Weakness: Secondary | ICD-10-CM | POA: Insufficient documentation

## 2017-05-05 DIAGNOSIS — R630 Anorexia: Secondary | ICD-10-CM | POA: Insufficient documentation

## 2017-05-05 DIAGNOSIS — E860 Dehydration: Secondary | ICD-10-CM | POA: Insufficient documentation

## 2017-05-05 DIAGNOSIS — R079 Chest pain, unspecified: Secondary | ICD-10-CM | POA: Diagnosis not present

## 2017-05-05 DIAGNOSIS — R609 Edema, unspecified: Secondary | ICD-10-CM

## 2017-05-05 DIAGNOSIS — E039 Hypothyroidism, unspecified: Secondary | ICD-10-CM | POA: Diagnosis not present

## 2017-05-05 DIAGNOSIS — R413 Other amnesia: Secondary | ICD-10-CM | POA: Diagnosis not present

## 2017-05-05 HISTORY — DX: Deficiency of other specified B group vitamins: E53.8

## 2017-05-05 HISTORY — DX: Malignant (primary) neoplasm, unspecified: C80.1

## 2017-05-05 HISTORY — DX: Anemia, unspecified: D64.9

## 2017-05-05 HISTORY — DX: Myalgia, unspecified site: M79.10

## 2017-05-05 HISTORY — DX: Disorder of thyroid, unspecified: E07.9

## 2017-05-05 LAB — URINALYSIS, ROUTINE W REFLEX MICROSCOPIC
BACTERIA UA: NONE SEEN
BILIRUBIN URINE: NEGATIVE
GLUCOSE, UA: NEGATIVE mg/dL
Hgb urine dipstick: NEGATIVE
KETONES UR: NEGATIVE mg/dL
Leukocytes, UA: NEGATIVE
Nitrite: NEGATIVE
PH: 7 (ref 5.0–8.0)
PROTEIN: 30 mg/dL — AB
Specific Gravity, Urine: 1.024 (ref 1.005–1.030)

## 2017-05-05 LAB — BASIC METABOLIC PANEL
Anion gap: 8 (ref 5–15)
BUN: 34 mg/dL — AB (ref 6–20)
CO2: 28 mmol/L (ref 22–32)
CREATININE: 0.78 mg/dL (ref 0.44–1.00)
Calcium: 9.3 mg/dL (ref 8.9–10.3)
Chloride: 102 mmol/L (ref 101–111)
GFR calc Af Amer: >60 mL/min (ref >60)
GFR calc non Af Amer: >60 mL/min (ref >60)
GLUCOSE: 107 mg/dL — AB (ref 65–99)
Potassium: 3.2 mmol/L — ABNORMAL LOW (ref 3.5–5.1)
Sodium: 138 mmol/L (ref 135–145)

## 2017-05-05 LAB — CBC WITH DIFFERENTIAL/PLATELET
Basophils Absolute: 0 10*3/uL (ref 0.0–0.1)
Basophils Relative: 0 %
Eosinophils Absolute: 0 10*3/uL (ref 0.0–0.7)
Eosinophils Relative: 0 %
HCT: 32 % — ABNORMAL LOW (ref 36.0–46.0)
Hemoglobin: 10.2 g/dL — ABNORMAL LOW (ref 12.0–15.0)
LYMPHS ABS: 1.5 10*3/uL (ref 0.7–4.0)
LYMPHS PCT: 16 %
MCH: 30.6 pg (ref 26.0–34.0)
MCHC: 31.9 g/dL (ref 30.0–36.0)
MCV: 96.1 fL (ref 78.0–100.0)
MONO ABS: 1.1 10*3/uL — AB (ref 0.1–1.0)
MONOS PCT: 12 %
Neutro Abs: 6.6 10*3/uL (ref 1.7–7.7)
Neutrophils Relative %: 72 %
Platelets: 446 10*3/uL — ABNORMAL HIGH (ref 150–400)
RBC: 3.33 MIL/uL — AB (ref 3.87–5.11)
RDW: 14.4 % (ref 11.5–15.5)
WBC: 9.3 10*3/uL (ref 4.0–10.5)

## 2017-05-05 LAB — I-STAT TROPONIN, ED: Troponin i, poc: 0.02 ng/mL (ref 0.00–0.08)

## 2017-05-05 LAB — BRAIN NATRIURETIC PEPTIDE: B Natriuretic Peptide: 188.4 pg/mL — ABNORMAL HIGH (ref 0.0–100.0)

## 2017-05-05 NOTE — Telephone Encounter (Signed)
SENT NOTES TO SCHEDULING 

## 2017-05-05 NOTE — ED Provider Notes (Signed)
Medical screening examination/treatment/procedure(s) were conducted as a shared visit with non-physician practitioner(s) and myself.  I personally evaluated the patient during the encounter.   EKG Interpretation  Date/Time:  Thursday May 05 2017 12:00:09 EDT Ventricular Rate:  89 PR Interval:    QRS Duration: 99 QT Interval:  358 QTC Calculation: 436 R Axis:   64 Text Interpretation:  Sinus rhythm Atrial premature complex agree. no sig change from previous Confirmed by Charlesetta Shanks [phone]) on 05/05/2017 2:04:06 PM     Patient has had weakness has been waxing and waning for several days. 2 days ago she had difficulty standing and walking. Yesterday she was up and walking. There is also been some waxing and waning confusion. Patient had recent diagnosis of atrial fibrillation. She has been started on anticoagulants.  I have examined the patient and she is alert and interactive. Speech is clear. Mental status is clear. At this time, heart is regular. I have had the patient stand and ambulate. She requires some assistance and appears frail but used both lower extremities symmetrically and was able to pivot and turn without imbalance. She did not exhibit cerebellar signs with walking and did not exhibit focal motor weakness.  Diagnostic studies have been evaluated. At this time exact etiology of symptoms are waxing and waning is unclear. Patient does appear to have general muscular weakness and concern for falling but gait testing does not illustrate localizing neurologic dysfunction either motor or cerebellar.  I agree with plan and management.   Charlesetta Shanks, MD 05/05/17 1610

## 2017-05-05 NOTE — ED Notes (Signed)
Pt is in stable condition upon d/c and ambulates from ED. 

## 2017-05-05 NOTE — ED Notes (Signed)
Heart healthy lunch ordered.  

## 2017-05-05 NOTE — ED Provider Notes (Signed)
[facility] DEPT Provider Note   CSN: [id number] Arrival date & time: 05/05/17  1131   History   Chief Complaint Chief Complaint  Patient presents with  . Abnormal Lab    HPI Charlene Duncan is a 81 y.o. female who presents with generalized weakness and confusion. PMHx significant for A.fib (started on Xarelto 2 days ago), hypothyroidism, anemia. Daughter and daughter in law are at bedside. She was originally seen on June 5th by her PCP for generalized weakness, trouble standing, anorexia with unintentional weight loss (~9 pounds). The patient lives alone but has been unable to care for herself and now someone has to stay with her because she has difficulty standing up. She is able to walk once she is up. She had a EKG and labs done by her PCP which showed mild leukocytosis, elevated BNP and CRP and Sed rate. Today the patient's daughter called today and told her PCP her symptoms are getting worse and therefore were told to come to the ED. The patient has also been confused and more irritable with leg swelling. She also had two episodes of chest pain yesterday which she vaguely describes. Also has right wrist pain and swelling.    HPI  Past Medical History:  Diagnosis Date  . Anemia   . Atrial fibrillation, controlled ([place])   . Myalgia   . Thyroid disease    hypothyroid  . Vitamin B 12 deficiency     There are no active problems to display for this patient.   No past surgical history on file.  OB History    No data available       Home Medications    Prior to Admission medications   Not on File    Family History No family history on file.  Social History Social History  Substance Use Topics  . Smoking status: Not on file  . Smokeless tobacco: Not on file  . Alcohol use Not on file     Allergies   Patient has no known allergies.   Review of Systems Review of Systems  Constitutional: Positive for appetite change. Negative for fever.  Respiratory:  Negative for cough and shortness of breath.   Cardiovascular: Positive for chest pain and leg swelling.  Gastrointestinal: Negative for abdominal pain, nausea and vomiting.  Musculoskeletal: Positive for arthralgias and gait problem.  Neurological: Positive for weakness. Negative for syncope, light-headedness and headaches.  Psychiatric/Behavioral: Positive for confusion.    Physical Exam Updated Vital Signs BP (!) 109/54 (BP Location: Left Arm)   Pulse 100   Temp 98.4 F (36.9 C) (Oral)   Resp 18   SpO2 100%   Physical Exam  Constitutional: She is oriented to person, place, and time. She appears well-developed and well-nourished. No distress.  HENT:  Head: Normocephalic and atraumatic.  Dry mucous membranes  Eyes: Conjunctivae are normal. Pupils are equal, round, and reactive to light. Right eye exhibits no discharge. Left eye exhibits no discharge. No scleral icterus.  Neck: Normal range of motion.  Cardiovascular: Normal rate and regular rhythm.  Exam reveals no gallop and no friction rub.   No murmur heard. Pulmonary/Chest: Effort normal and breath sounds normal. No respiratory distress. She has no wheezes. She has no rales. She exhibits no tenderness.  Abdominal: Soft. Bowel sounds are normal. She exhibits no distension and no mass. There is no tenderness. There is no rebound and no guarding. No hernia.  Musculoskeletal:  2+ pitting edema bilaterally to ankles  Neurological: She is alert  and oriented to person, place, and time.  Slow to stand  Skin: Skin is warm and dry.  Psychiatric: She has a normal mood and affect. Her behavior is normal.  Nursing note and vitals reviewed.    ED Treatments / Results  Labs (all labs ordered are listed, but only abnormal results are displayed) Labs Reviewed  BASIC METABOLIC PANEL - Abnormal; Notable for the following:       Result Value   Potassium 3.2 (*)    Glucose, Bld 107 (*)    BUN 34 (*)    All other components within normal  limits  CBC WITH DIFFERENTIAL/PLATELET - Abnormal; Notable for the following:    RBC 3.33 (*)    Hemoglobin 10.2 (*)    HCT 32.0 (*)    Platelets 446 (*)    Monocytes Absolute 1.1 (*)    All other components within normal limits  BRAIN NATRIURETIC PEPTIDE - Abnormal; Notable for the following:    B Natriuretic Peptide 188.4 (*)    All other components within normal limits  URINALYSIS, ROUTINE W REFLEX MICROSCOPIC - Abnormal; Notable for the following:    Color, Urine AMBER (*)    APPearance HAZY (*)    Protein, ur 30 (*)    Squamous Epithelial / LPF 0-5 (*)    All other components within normal limits  I-STAT TROPOININ, ED    EKG  EKG Interpretation  Date/Time:  Thursday May 05 2017 12:00:09 EDT Ventricular Rate:  89 PR Interval:    QRS Duration: 99 QT Interval:  358 QTC Calculation: 436 R Axis:   64 Text Interpretation:  Sinus rhythm Atrial premature complex agree. no sig change from previous Confirmed by Charlesetta Shanks [phone]) on 05/05/2017 2:04:06 PM       Radiology Ct Head Wo Contrast  Result Date: 05/05/2017 CLINICAL DATA:  Patient with confusion. EXAM: CT HEAD WITHOUT CONTRAST TECHNIQUE: Contiguous axial images were obtained from the base of the skull through the vertex without intravenous contrast. COMPARISON:  MRI brain 02/08/2006. FINDINGS: Brain: Ventricles and sulci are prominent compatible with atrophy. Periventricular and subcortical white matter hypodensity compatible with chronic microvascular ischemic changes. Bilateral temporal lobe atrophy with associated dilatation of the lateral ventricles. No evidence for acute cortically based infarct, intracranial hemorrhage, mass lesion or mass-effect. Vascular: Unremarkable. Skull: Intact. Sinuses/Orbits: Paranasal sinuses are well aerated. Minimal fluid within the mastoid air cells bilaterally. Orbits are unremarkable. Other: None. IMPRESSION: No acute intracranial process. Bilateral temporal lobe atrophy, progressed  from MRI 2007. Electronically Signed   By: Lovey Newcomer M.D.   On: 05/05/2017 15:24    Procedures Procedures (including critical care time)  Medications Ordered in ED Medications - No data to display   Initial Impression / Assessment and Plan / ED Course  I have reviewed the triage vital signs and the nursing notes.  Pertinent labs & imaging results that were available during my care of the patient were reviewed by me and considered in my medical decision making (see chart for details).  81 year old female with generalized weakness. Unclear etiology. May be related to decreased PO intake, deconditioning, and dehydration. Vitals are normal. CBC remarkable for anemia. BMP remarkable for mild hypokalemia. BNP is 188, down from 323 at her PCP office several days ago. EKG is SR. Trop is 0.02. UA is clean. CT head is negative. Shared visit with Dr. Colvin Caroli. Will order home PT/OT and advised follow up with PCP. Return precautions given.  Final Clinical Impressions(s) / ED  Diagnoses   Final diagnoses:  Weakness  Peripheral edema  Anorexia  Dehydration    New Prescriptions New Prescriptions   No medications on file     Iris Pert 05/05/17 [place], [place], MD 05/09/17 [phone]

## 2017-05-05 NOTE — ED Notes (Signed)
Pt has returned from CT scan.

## 2017-05-05 NOTE — ED Notes (Signed)
PA made aware family anxious; she is calling PCP to gain better understanding of why sent her here. Family at bedside.

## 2017-05-05 NOTE — ED Notes (Signed)
Lunch tray delivered to pt

## 2017-05-05 NOTE — ED Triage Notes (Signed)
Pt was seen at [facility] PCP and labs drawn for chest pain. They called today and stated "something was abnormal and go to hospital". Daughter called ambulance. Unsure what lab it was. No complaints whatsoever at this time. Lives alone and performs ADLs independently.

## 2017-05-05 NOTE — Discharge Instructions (Signed)
Please follow up with your doctor Physical therapy has been ordered to come to your home for help with weakness Return for worsening symptoms

## 2017-05-13 ENCOUNTER — Ambulatory Visit: Payer: Medicare Other | Admitting: Neurology

## 2017-05-13 ENCOUNTER — Telehealth: Payer: Self-pay | Admitting: Cardiovascular Disease

## 2017-05-13 DIAGNOSIS — R531 Weakness: Secondary | ICD-10-CM | POA: Diagnosis not present

## 2017-05-13 DIAGNOSIS — E538 Deficiency of other specified B group vitamins: Secondary | ICD-10-CM | POA: Diagnosis not present

## 2017-05-13 DIAGNOSIS — E611 Iron deficiency: Secondary | ICD-10-CM | POA: Diagnosis not present

## 2017-05-13 DIAGNOSIS — E039 Hypothyroidism, unspecified: Secondary | ICD-10-CM | POA: Diagnosis not present

## 2017-05-13 DIAGNOSIS — R700 Elevated erythrocyte sedimentation rate: Secondary | ICD-10-CM | POA: Diagnosis not present

## 2017-05-13 DIAGNOSIS — I4891 Unspecified atrial fibrillation: Secondary | ICD-10-CM | POA: Diagnosis not present

## 2017-05-13 DIAGNOSIS — R413 Other amnesia: Secondary | ICD-10-CM | POA: Diagnosis not present

## 2017-05-13 DIAGNOSIS — D649 Anemia, unspecified: Secondary | ICD-10-CM | POA: Diagnosis not present

## 2017-05-13 DIAGNOSIS — I10 Essential (primary) hypertension: Secondary | ICD-10-CM | POA: Diagnosis not present

## 2017-05-13 NOTE — Telephone Encounter (Signed)
Received records from [facility] for appointment with Dr Gwenlyn Found on 05/20/17.  Records put with Dr Kennon Holter schedule for 05/20/17. lp

## 2017-05-16 ENCOUNTER — Telehealth: Payer: Self-pay | Admitting: *Deleted

## 2017-05-16 ENCOUNTER — Encounter: Payer: Self-pay | Admitting: Neurology

## 2017-05-16 ENCOUNTER — Ambulatory Visit (INDEPENDENT_AMBULATORY_CARE_PROVIDER_SITE_OTHER): Payer: Medicare Other | Admitting: Neurology

## 2017-05-16 DIAGNOSIS — F0280 Dementia in other diseases classified elsewhere without behavioral disturbance: Secondary | ICD-10-CM

## 2017-05-16 DIAGNOSIS — G301 Alzheimer's disease with late onset: Secondary | ICD-10-CM | POA: Diagnosis not present

## 2017-05-16 DIAGNOSIS — G309 Alzheimer's disease, unspecified: Secondary | ICD-10-CM

## 2017-05-16 HISTORY — DX: Dementia in other diseases classified elsewhere, unspecified severity, without behavioral disturbance, psychotic disturbance, mood disturbance, and anxiety: F02.80

## 2017-05-16 MED ORDER — DONEPEZIL HCL 5 MG PO TABS: 5.0000 mg | ORAL_TABLET | Freq: Every day | ORAL | 1 refills | Status: DC

## 2017-05-16 NOTE — Progress Notes (Signed)
Reason for visit: Dementia  Referring physician: Dr. Manya Silvas Charlene Duncan is a 81 y.o. female  History of present illness:  Charlene Duncan is a 81 year old white female with a history of a progressive memory disturbance that has been noticed by the family over the last 6 months. The patient has been living alone, she has not driven a car for at least 3 years. She can having problems with keeping up with finances, she was not paying her bills. The family has taken over this over the last 2 months. The patient has required assistance keeping up with medications and appointments as well. She has had difficulty cooking, she cannot figure out how to use her oven anymore. The patient is now healing up food in the microwave. The patient had not been eating well, she was losing weight and became dehydrated necessitating an emergency room visit on 05/05/2017. The patient is back to eating better, her family is supervising this. The patient her self does not have any understanding that she has a memory problem. The patient does report some generalized fatigue and weakness, she is sleeping fairly well at night. She does have a mild gait disorder, she is not had any falls. She has not had any significant issues controlling the bowels or the bladder. The patient has undergone a recent CT scan of brain that shows generalized atrophy with hydrocephalus ex vacuo. Blood work recently was done that included a vitamin B12 level and thyroid profile that was unremarkable. The patient is on B12 supplementation. She is sent to this office for further evaluation.  Past Medical History:  Diagnosis Date  . Anemia   . Atrial fibrillation, controlled ([place])   . Cancer (HCC)    breast  . High cholesterol   . Myalgia   . Thyroid disease    hypothyroid  . Vitamin B 12 deficiency     No past surgical history on file.  History reviewed. No pertinent family history.  Social history:  reports that she has never  smoked. She has never used smokeless tobacco. She reports that she does not drink alcohol or use drugs.  Medications:  Prior to Admission medications   Medication Sig Start Date End Date Taking? Authorizing Provider  acetaZOLAMIDE (DIAMOX) 250 MG tablet Take 250 mg by mouth as needed.   Yes [provider]  celecoxib (CELEBREX) 200 MG capsule Take 200 mg by mouth daily.   Yes [provider]  Cholecalciferol (VITAMIN D PO) Take 1 tablet by mouth daily.   Yes [provider]  DULoxetine (CYMBALTA) 30 MG capsule Take 30 mg by mouth daily.   Yes [provider]  Iron-FA-B Cmp-C-Biot-Probiotic (FUSION PLUS PO) Take 1 capsule by mouth daily.   Yes [provider]  levothyroxine (SYNTHROID, LEVOTHROID) 88 MCG tablet Take 88 mcg by mouth daily. 03/21/17  Yes [provider]  Rivaroxaban (XARELTO) 15 MG TABS tablet Take 15 mg by mouth 2 (two) times daily with a meal.   Yes [provider]  vitamin B-12 (CYANOCOBALAMIN) 500 MCG tablet Take 500 mcg by mouth daily.   Yes [provider]     No Known Allergies  ROS:  Out of a complete 14 system review of symptoms, the patient complains only of the following symptoms, and all other reviewed systems are negative.  Fatigue Swelling in the legs  Blood pressure 130/69, pulse 71, height 5\' 6"  (1.676 m), weight 111 lb 8 oz (50.6 kg).  Physical Exam  General: The patient is alert and cooperative at the time of the examination.  Eyes: Pupils are equal, round, and reactive to light. Discs are flat bilaterally.  Neck: The neck is supple, no carotid bruits are noted.  Respiratory: The respiratory examination is clear.  Cardiovascular: The cardiovascular examination reveals a regular rate and rhythm, no obvious murmurs or rubs are noted.  Skin: Extremities are with 1+ edema at the feet bilaterally.  Neurologic Exam  Mental status: The patient is alert and oriented x 2 at the time  of the examination (not oriented to date). The Mini-Mental Status Examination done today shows a total score of 18/30..  Cranial nerves: Facial symmetry is present. There is good sensation of the face to pinprick and soft touch bilaterally. The strength of the facial muscles and the muscles to head turning and shoulder shrug are normal bilaterally. Speech is well enunciated, no aphasia or dysarthria is noted. Extraocular movements are full. Visual fields are full. The tongue is midline, and the patient has symmetric elevation of the soft palate. No obvious hearing deficits are noted.  Motor: The motor testing reveals 5 over 5 strength of all 4 extremities. Good symmetric motor tone is noted throughout.  Sensory: Sensory testing is intact to pinprick, soft touch, vibration sensation, and position sense on all 4 extremities. No evidence of extinction is noted.  Coordination: Cerebellar testing reveals good finger-nose-finger and heel-to-shin bilaterally.  Gait and station: Gait is slightly wide-based, the patient usually uses a quad cane for ambulation. Tandem gait was not attempted. Romberg is negative. No drift is seen.  Reflexes: Deep tendon reflexes are symmetric and normal bilaterally. Toes are downgoing bilaterally.   CT head 05/05/17:  IMPRESSION: No acute intracranial process.  Bilateral temporal lobe atrophy, progressed from MRI 2007.  * CT scan images were reviewed online. I agree with the written report.    Assessment/Plan:  1. Progressive memory disturbance, dementia  2. Mild gait disturbance  The patient has reluctantly agreed to go on medication for memory, I will call in a prescription for Aricept taking 5 mg at night. She will follow-up in 5 months. The patient has refused to allow anyone to come into her house to help her. The patient has apparently progressed rather rapidly over the last 6 months. The family will need to plan for the future, the patient may require an  extended care facility in the next year or so. She will follow-up in about 5 months.  Jill Alexanders MD 05/16/2017 10:23 AM  [facility] [address] [place], [place] [address]  Phone [phone] Fax [phone]

## 2017-05-16 NOTE — Telephone Encounter (Signed)
Called and spoke with son, Jeneen Rinks ([place]). Verified correct pharmacy.  Faxed printed/signed rx aricept to pt pharmacy. Fax: [phone]. Received confirmation.

## 2017-05-20 ENCOUNTER — Ambulatory Visit: Payer: Medicare Other | Admitting: Cardiovascular Disease

## 2017-05-20 ENCOUNTER — Emergency Department (HOSPITAL_COMMUNITY)
Admission: EM | Admit: 2017-05-20 | Discharge: 2017-05-20 | Disposition: A | Discharge status: Home / Self Care | Payer: Medicare Other | Attending: Emergency Medicine | Admitting: Emergency Medicine

## 2017-05-20 ENCOUNTER — Emergency Department (HOSPITAL_COMMUNITY): Payer: Medicare Other

## 2017-05-20 ENCOUNTER — Emergency Department (HOSPITAL_BASED_OUTPATIENT_CLINIC_OR_DEPARTMENT_OTHER)
Admission: EM | Admit: 2017-05-20 | Discharge: 2017-05-20 | Disposition: A | Discharge status: Home / Self Care | Payer: Medicare Other | Source: Home / Self Care | Attending: Emergency Medicine | Admitting: Emergency Medicine

## 2017-05-20 ENCOUNTER — Telehealth: Payer: Self-pay | Admitting: Neurology

## 2017-05-20 ENCOUNTER — Emergency Department (HOSPITAL_COMMUNITY): Admission: EM | Admit: 2017-05-20 | Payer: Medicare Other | Source: Home / Self Care

## 2017-05-20 ENCOUNTER — Encounter (HOSPITAL_COMMUNITY): Payer: Self-pay | Admitting: Emergency Medicine

## 2017-05-20 DIAGNOSIS — M79609 Pain in unspecified limb: Secondary | ICD-10-CM | POA: Diagnosis not present

## 2017-05-20 DIAGNOSIS — Z853 Personal history of malignant neoplasm of breast: Secondary | ICD-10-CM | POA: Diagnosis not present

## 2017-05-20 DIAGNOSIS — I10 Essential (primary) hypertension: Secondary | ICD-10-CM | POA: Diagnosis not present

## 2017-05-20 DIAGNOSIS — M25562 Pain in left knee: Secondary | ICD-10-CM | POA: Diagnosis not present

## 2017-05-20 DIAGNOSIS — M79661 Pain in right lower leg: Secondary | ICD-10-CM | POA: Diagnosis not present

## 2017-05-20 DIAGNOSIS — Z79899 Other long term (current) drug therapy: Secondary | ICD-10-CM | POA: Insufficient documentation

## 2017-05-20 DIAGNOSIS — M11261 Other chondrocalcinosis, right knee: Secondary | ICD-10-CM | POA: Diagnosis not present

## 2017-05-20 DIAGNOSIS — M79604 Pain in right leg: Secondary | ICD-10-CM | POA: Diagnosis present

## 2017-05-20 DIAGNOSIS — G309 Alzheimer's disease, unspecified: Secondary | ICD-10-CM | POA: Insufficient documentation

## 2017-05-20 DIAGNOSIS — R404 Transient alteration of awareness: Secondary | ICD-10-CM | POA: Diagnosis not present

## 2017-05-20 DIAGNOSIS — M25561 Pain in right knee: Secondary | ICD-10-CM | POA: Diagnosis not present

## 2017-05-20 DIAGNOSIS — M7989 Other specified soft tissue disorders: Secondary | ICD-10-CM | POA: Diagnosis not present

## 2017-05-20 DIAGNOSIS — E031 Congenital hypothyroidism without goiter: Secondary | ICD-10-CM | POA: Insufficient documentation

## 2017-05-20 DIAGNOSIS — M79605 Pain in left leg: Secondary | ICD-10-CM | POA: Insufficient documentation

## 2017-05-20 DIAGNOSIS — M1990 Unspecified osteoarthritis, unspecified site: Secondary | ICD-10-CM | POA: Diagnosis not present

## 2017-05-20 DIAGNOSIS — R531 Weakness: Secondary | ICD-10-CM | POA: Diagnosis not present

## 2017-05-20 DIAGNOSIS — Z7901 Long term (current) use of anticoagulants: Secondary | ICD-10-CM | POA: Diagnosis not present

## 2017-05-20 DIAGNOSIS — R600 Localized edema: Secondary | ICD-10-CM | POA: Diagnosis not present

## 2017-05-20 DIAGNOSIS — M1388 Other specified arthritis, other site: Secondary | ICD-10-CM | POA: Diagnosis not present

## 2017-05-20 DIAGNOSIS — M79662 Pain in left lower leg: Secondary | ICD-10-CM | POA: Diagnosis not present

## 2017-05-20 LAB — URINALYSIS, ROUTINE W REFLEX MICROSCOPIC
Bilirubin Urine: NEGATIVE
Glucose, UA: NEGATIVE mg/dL
Hgb urine dipstick: NEGATIVE
Ketones, ur: NEGATIVE mg/dL
Leukocytes, UA: NEGATIVE
Nitrite: NEGATIVE
Protein, ur: NEGATIVE mg/dL
Specific Gravity, Urine: 1.015 (ref 1.005–1.030)
pH: 7 (ref 5.0–8.0)

## 2017-05-20 LAB — CBC WITH DIFFERENTIAL/PLATELET
Basophils Absolute: 0 K/uL (ref 0.0–0.1)
Basophils Relative: 0 %
Eosinophils Absolute: 0 K/uL (ref 0.0–0.7)
Eosinophils Relative: 0 %
HCT: 31.1 % — ABNORMAL LOW (ref 36.0–46.0)
Hemoglobin: 10.3 g/dL — ABNORMAL LOW (ref 12.0–15.0)
Lymphocytes Relative: 7 %
Lymphs Abs: 0.8 K/uL (ref 0.7–4.0)
MCH: 31.6 pg (ref 26.0–34.0)
MCHC: 33.1 g/dL (ref 30.0–36.0)
MCV: 95.4 fL (ref 78.0–100.0)
Monocytes Absolute: 1.2 K/uL — ABNORMAL HIGH (ref 0.1–1.0)
Monocytes Relative: 11 %
Neutro Abs: 9.3 K/uL — ABNORMAL HIGH (ref 1.7–7.7)
Neutrophils Relative %: 82 %
Platelets: 442 K/uL — ABNORMAL HIGH (ref 150–400)
RBC: 3.26 MIL/uL — ABNORMAL LOW (ref 3.87–5.11)
RDW: 16.5 % — ABNORMAL HIGH (ref 11.5–15.5)
WBC: 11.3 K/uL — ABNORMAL HIGH (ref 4.0–10.5)

## 2017-05-20 LAB — BASIC METABOLIC PANEL WITH GFR
Anion gap: 6 (ref 5–15)
BUN: 27 mg/dL — ABNORMAL HIGH (ref 6–20)
CO2: 28 mmol/L (ref 22–32)
Calcium: 8.8 mg/dL — ABNORMAL LOW (ref 8.9–10.3)
Chloride: 101 mmol/L (ref 101–111)
Creatinine, Ser: 0.66 mg/dL (ref 0.44–1.00)
GFR calc Af Amer: >60 mL/min
GFR calc non Af Amer: >60 mL/min
Glucose, Bld: 112 mg/dL — ABNORMAL HIGH (ref 65–99)
Potassium: 3.9 mmol/L (ref 3.5–5.1)
Sodium: 135 mmol/L (ref 135–145)

## 2017-05-20 MED ORDER — ACETAMINOPHEN 325 MG PO TABS
650.0000 mg | ORAL_TABLET | Freq: Once | ORAL | Status: AC
  Administered 2017-05-20: 650 mg via ORAL
  Filled 2017-05-20: qty 2

## 2017-05-20 NOTE — ED Notes (Signed)
Ultrasound bedside.

## 2017-05-20 NOTE — ED Triage Notes (Signed)
Per EMS-states patient has a history of dementia-complaining to leg pain to left ankle pain-unable to specifically provide consistent information-family wants her evaluated

## 2017-05-20 NOTE — Telephone Encounter (Signed)
error 

## 2017-05-20 NOTE — ED Provider Notes (Signed)
[facility] DEPT Provider Note   CSN: [id number] Arrival date & time: 05/20/17  1055     History   Chief Complaint Chief Complaint  Patient presents with  . Leg Pain    HPI Charlene Duncan is a 81 y.o. female.  The history is provided by a relative.  Leg Pain   This is a new problem. The current episode started 12 to 24 hours ago. The problem occurs constantly. The problem has not changed since onset.The pain is present in the right lower leg. The quality of the pain is described as constant. The pain is moderate. Associated symptoms include limited range of motion and stiffness. Pertinent negatives include no numbness and no tingling. The symptoms are aggravated by standing and activity. She has tried nothing for the symptoms. There has been no history of extremity trauma.   History of alzheimer's disease. History per daughter. History of right knee OA. Unable to ambulate due to right knee and bilateral (right greater than left) leg pain x 1 day. No fall or trauma. No significant swelling. No fever or chills or overlying skin changes.  Past Medical History:  Diagnosis Date  . Alzheimer disease 05/16/2017  . Anemia   . Atrial fibrillation, controlled ([place])   . Cancer (HCC)    breast  . High cholesterol   . Hypertension   . Iron deficiency   . Myalgia   . OA (osteoarthritis) of knee   . Thyroid disease    hypothyroid  . Vitamin B 12 deficiency     Patient Active Problem List   Diagnosis Date Noted  . Alzheimer disease 05/16/2017  . Arthritis 06/16/2014  . Congenital hypothyroidism without goiter 06/16/2014  . History of breast cancer 06/16/2014  . Hyperlipidemia 06/16/2014  . Iron deficiency 06/16/2014    History reviewed. No pertinent surgical history.  OB History    No data available       Home Medications    Prior to Admission medications   Medication Sig Start Date End Date Taking? Authorizing Provider  acetaZOLAMIDE (DIAMOX) 250 MG tablet Take  250 mg by mouth as needed.   Yes [provider]  atorvastatin (LIPITOR) 10 MG tablet Take 10 mg by mouth daily.   Yes [provider]  celecoxib (CELEBREX) 200 MG capsule Take 200 mg by mouth daily.   Yes [provider]  Cholecalciferol (VITAMIN D PO) Take 1 tablet by mouth daily.   Yes [provider]  donepezil (ARICEPT) 5 MG tablet Take 1 tablet (5 mg total) by mouth at bedtime. 05/16/17  Yes [name], MD  ferrous fumarate (HEMOCYTE - 106 MG FE) 325 (106 Fe) MG TABS tablet Take 325 mg by mouth daily.   Yes [provider]  Iron-FA-B Cmp-C-Biot-Probiotic (FUSION PLUS PO) Take 1 capsule by mouth daily.   Yes [provider]  levothyroxine (SYNTHROID, LEVOTHROID) 88 MCG tablet Take 88 mcg by mouth every other day.  03/21/17  Yes [provider]  Rivaroxaban (XARELTO) 15 MG TABS tablet Take 15 mg by mouth 2 (two) times daily with a meal.   Yes [provider]  vitamin B-12 (CYANOCOBALAMIN) 500 MCG tablet Take 500 mcg by mouth daily.   Yes [provider]    Family History Family History  Problem Relation Age of Onset  . Arthritis Mother   . Diabetes Mother   . Cirrhosis Sister   . Cirrhosis Brother     Social History Social History  Substance Use Topics  .  Smoking status: Never Smoker  . Smokeless tobacco: Never Used  . Alcohol use No     Allergies   Naproxen   Review of Systems Review of Systems  Constitutional: Negative for fever.  Respiratory: Negative for shortness of breath.   Cardiovascular: Negative for chest pain.  Musculoskeletal: Positive for stiffness.       Right knee and right leg pain  Neurological: Negative for tingling and numbness.  All other systems reviewed and are negative.    Physical Exam Updated Vital Signs BP 129/73 (BP Location: Left Arm)   Pulse 83   Temp 98.3 F (36.8 C) (Oral)   Resp 17   SpO2 100%   Physical Exam Physical Exam  Nursing note and  vitals reviewed. Constitutional: Well developed, well nourished, non-toxic, and in no acute distress Head: Normocephalic and atraumatic.  Mouth/Throat: Oropharynx is clear and moist.  Neck: Normal range of motion. Neck supple.  Cardiovascular: Normal rate and regular rhythm.  +2 DP pulses bilaterally Pulmonary/Chest: Effort normal and breath sounds normal.  Abdominal: Soft. There is no tenderness. There is no rebound and no guarding.  Musculoskeletal: ROM limited in right knee due to pain. No LE edema.  Neurological: Alert, no facial droop, fluent speech, moves all extremities symmetrically Skin: Skin is warm and dry.  Psychiatric: Cooperative   ED Treatments / Results  Labs (all labs ordered are listed, but only abnormal results are displayed) Labs Reviewed  CBC WITH DIFFERENTIAL/PLATELET - Abnormal; Notable for the following:       Result Value   WBC 11.3 (*)    RBC 3.26 (*)    Hemoglobin 10.3 (*)    HCT 31.1 (*)    RDW 16.5 (*)    Platelets 442 (*)    Neutro Abs 9.3 (*)    Monocytes Absolute 1.2 (*)    All other components within normal limits  BASIC METABOLIC PANEL - Abnormal; Notable for the following:    Glucose, Bld 112 (*)    BUN 27 (*)    Calcium 8.8 (*)    All other components within normal limits  URINALYSIS, ROUTINE W REFLEX MICROSCOPIC    EKG  EKG Interpretation None       Radiology Dg Knee 2 Views Left  Result Date: 05/20/2017 CLINICAL DATA:  BILATERAL knee and ankle pain. Dementia. No injury. EXAM: LEFT KNEE - 1-2 VIEW COMPARISON:  None. FINDINGS: Lateral joint space narrowing. No effusion. Marked patellofemoral joint space narrowing. Cartilage calcification laterally. No acute fracture or dislocation. Vascular calcification. IMPRESSION: Degenerative changes as described. Electronically Signed   By: Staci Righter M.D.   On: 05/20/2017 12:50   Dg Knee 2 Views Right  Result Date: 05/20/2017 CLINICAL DATA:  Bilateral knee and ankle pain.  No known  injury. EXAM: RIGHT KNEE - 1-2 VIEW COMPARISON:  None. FINDINGS: Chondrocalcinosis is present within the lateral meniscus. Moderate degenerative changes are present in the patellofemoral compartment. The knee is located. There is no significant joint effusion. Vascular calcifications are noted. IMPRESSION: 1. Moderate degenerative changes within the patellofemoral compartment of the knee. 2. Chondrocalcinosis in the lateral meniscus. Question CPPD arthropathy. 3. Atherosclerosis. 4. No acute abnormality. Electronically Signed   By: San Morelle M.D.   On: 05/20/2017 12:50   Dg Ankle 2 Views Left  Result Date: 05/20/2017 CLINICAL DATA:  Bilateral ankle pain today with no history of injury. Left ankle swelling. EXAM: LEFT ANKLE - 2 VIEW COMPARISON:  None. FINDINGS: Two-view exam shows no fracture. No subluxation  or dislocation. Ankle mortise is preserved. Bones are demineralized. Soft tissue swelling is evident. IMPRESSION: No acute bony abnormality on this two-view study. Electronically Signed   By: Misty Stanley M.D.   On: 05/20/2017 12:51   Dg Ankle 2 Views Right  Result Date: 05/20/2017 CLINICAL DATA:  Bilateral ankle pain.  Initial encounter. EXAM: RIGHT ANKLE - 2 VIEW COMPARISON:  Left ankle radiographs from the same day. FINDINGS: Diffuse soft tissue swelling likely reflects edema. The ankle is located. No acute fractures present. Degenerative changes are present in the midfoot. IMPRESSION: 1. Mild diffuse edema. 2. No acute osseous abnormality. Electronically Signed   By: San Morelle M.D.   On: 05/20/2017 12:53    Procedures Procedures (including critical care time)  Medications Ordered in ED Medications  acetaminophen (TYLENOL) tablet 650 mg (650 mg Oral Given 05/20/17 1200)     Initial Impression / Assessment and Plan / ED Course  I have reviewed the triage vital signs and the nursing notes.  Pertinent labs & imaging results that were available during my care of the  patient were reviewed by me and considered in my medical decision making (see chart for details).     History of arthritis in the knees, p/w pain in bilateral legs and stiffness since this morning w/o trauma. Well appearing. Vital signs within normal limits. No deformities, soft tissue swelling, overlying skin changes. With limited ROM of bilateral knees 2/2 to pain. NV in tact extremities. X-rays just revealing arthritis of the knees on visualization. Bilateral DVT studies are normal. Blood work and UA reassuring. Patient's PCP did call charge nurse who relayed that patient with recently diagnosed likely RA, and has responded to steroids. She will be referred to rheumatology but could be discharged home. She has prednisone at pharmacy for her. She is able to ambulate with walker in ED, which she has at home. Home health also ordered for patient as she lives alone. Strict return and follow-up instructions reviewed. Family and patient expressed understanding of all discharge instructions and felt comfortable with the plan of care.   Final Clinical Impressions(s) / ED Diagnoses   Final diagnoses:  Bilateral leg pain  Arthritis    New Prescriptions Discharge Medication List as of 05/20/2017  4:04 PM       Forde Dandy, MD 05/20/17 1714

## 2017-05-20 NOTE — Progress Notes (Signed)
**  Preliminary report by tech**  Bilateral lower extremity venous duplex completed. There is no evidence of deep or superficial vein thrombosis involving the right and left lower extremities. All visualized vessels appear patent and compressible. There is no evidence of Baker's cysts bilaterally. Results were given to Dr. Oleta Mouse.  05/20/17 1:41 PM Charlene Duncan RVT

## 2017-05-20 NOTE — ED Notes (Signed)
Bed: WA04 Expected date:  Expected time:  Means of arrival:  Comments: EMS-leg weakness

## 2017-05-20 NOTE — Discharge Instructions (Signed)
Your primary care doctor is arranging rheumatology follow-up for you as it is likely your arthritis causing your pain. You were called a prescription for steroids. Please take as prescribed. Our case manager will be arranging home health for you for physical therapy, nursing care, and social work Please return for worsening symptoms, including fever, confusion, injuries, or any other symptoms concerning to you.

## 2017-05-25 DIAGNOSIS — G309 Alzheimer's disease, unspecified: Secondary | ICD-10-CM | POA: Diagnosis not present

## 2017-05-25 DIAGNOSIS — D649 Anemia, unspecified: Secondary | ICD-10-CM | POA: Diagnosis not present

## 2017-05-25 DIAGNOSIS — I10 Essential (primary) hypertension: Secondary | ICD-10-CM | POA: Diagnosis not present

## 2017-05-25 DIAGNOSIS — F0280 Dementia in other diseases classified elsewhere without behavioral disturbance: Secondary | ICD-10-CM | POA: Diagnosis not present

## 2017-05-25 DIAGNOSIS — E039 Hypothyroidism, unspecified: Secondary | ICD-10-CM | POA: Diagnosis not present

## 2017-05-25 DIAGNOSIS — M170 Bilateral primary osteoarthritis of knee: Secondary | ICD-10-CM | POA: Diagnosis not present

## 2017-05-25 DIAGNOSIS — Z853 Personal history of malignant neoplasm of breast: Secondary | ICD-10-CM | POA: Diagnosis not present

## 2017-05-25 DIAGNOSIS — M11261 Other chondrocalcinosis, right knee: Secondary | ICD-10-CM | POA: Diagnosis not present

## 2017-05-25 DIAGNOSIS — E611 Iron deficiency: Secondary | ICD-10-CM | POA: Diagnosis not present

## 2017-05-25 DIAGNOSIS — I4891 Unspecified atrial fibrillation: Secondary | ICD-10-CM | POA: Diagnosis not present

## 2017-05-25 DIAGNOSIS — E785 Hyperlipidemia, unspecified: Secondary | ICD-10-CM | POA: Diagnosis not present

## 2017-05-26 DIAGNOSIS — I4891 Unspecified atrial fibrillation: Secondary | ICD-10-CM | POA: Diagnosis not present

## 2017-05-26 DIAGNOSIS — D649 Anemia, unspecified: Secondary | ICD-10-CM | POA: Diagnosis not present

## 2017-05-26 DIAGNOSIS — M170 Bilateral primary osteoarthritis of knee: Secondary | ICD-10-CM | POA: Diagnosis not present

## 2017-05-26 DIAGNOSIS — M11261 Other chondrocalcinosis, right knee: Secondary | ICD-10-CM | POA: Diagnosis not present

## 2017-05-26 DIAGNOSIS — I10 Essential (primary) hypertension: Secondary | ICD-10-CM | POA: Diagnosis not present

## 2017-05-26 DIAGNOSIS — G309 Alzheimer's disease, unspecified: Secondary | ICD-10-CM | POA: Diagnosis not present

## 2017-05-31 DIAGNOSIS — I10 Essential (primary) hypertension: Secondary | ICD-10-CM | POA: Diagnosis not present

## 2017-05-31 DIAGNOSIS — G309 Alzheimer's disease, unspecified: Secondary | ICD-10-CM | POA: Diagnosis not present

## 2017-05-31 DIAGNOSIS — M170 Bilateral primary osteoarthritis of knee: Secondary | ICD-10-CM | POA: Diagnosis not present

## 2017-05-31 DIAGNOSIS — I4891 Unspecified atrial fibrillation: Secondary | ICD-10-CM | POA: Diagnosis not present

## 2017-05-31 DIAGNOSIS — D649 Anemia, unspecified: Secondary | ICD-10-CM | POA: Diagnosis not present

## 2017-05-31 DIAGNOSIS — M11261 Other chondrocalcinosis, right knee: Secondary | ICD-10-CM | POA: Diagnosis not present

## 2017-06-02 DIAGNOSIS — M11261 Other chondrocalcinosis, right knee: Secondary | ICD-10-CM | POA: Diagnosis not present

## 2017-06-02 DIAGNOSIS — M170 Bilateral primary osteoarthritis of knee: Secondary | ICD-10-CM | POA: Diagnosis not present

## 2017-06-02 DIAGNOSIS — I10 Essential (primary) hypertension: Secondary | ICD-10-CM | POA: Diagnosis not present

## 2017-06-02 DIAGNOSIS — G309 Alzheimer's disease, unspecified: Secondary | ICD-10-CM | POA: Diagnosis not present

## 2017-06-02 DIAGNOSIS — D649 Anemia, unspecified: Secondary | ICD-10-CM | POA: Diagnosis not present

## 2017-06-02 DIAGNOSIS — I4891 Unspecified atrial fibrillation: Secondary | ICD-10-CM | POA: Diagnosis not present

## 2017-06-03 DIAGNOSIS — I4891 Unspecified atrial fibrillation: Secondary | ICD-10-CM | POA: Diagnosis not present

## 2017-06-03 DIAGNOSIS — M170 Bilateral primary osteoarthritis of knee: Secondary | ICD-10-CM | POA: Diagnosis not present

## 2017-06-03 DIAGNOSIS — M11261 Other chondrocalcinosis, right knee: Secondary | ICD-10-CM | POA: Diagnosis not present

## 2017-06-03 DIAGNOSIS — I10 Essential (primary) hypertension: Secondary | ICD-10-CM | POA: Diagnosis not present

## 2017-06-03 DIAGNOSIS — D649 Anemia, unspecified: Secondary | ICD-10-CM | POA: Diagnosis not present

## 2017-06-03 DIAGNOSIS — G309 Alzheimer's disease, unspecified: Secondary | ICD-10-CM | POA: Diagnosis not present

## 2017-06-07 DIAGNOSIS — M11261 Other chondrocalcinosis, right knee: Secondary | ICD-10-CM | POA: Diagnosis not present

## 2017-06-07 DIAGNOSIS — M170 Bilateral primary osteoarthritis of knee: Secondary | ICD-10-CM | POA: Diagnosis not present

## 2017-06-07 DIAGNOSIS — D649 Anemia, unspecified: Secondary | ICD-10-CM | POA: Diagnosis not present

## 2017-06-07 DIAGNOSIS — G309 Alzheimer's disease, unspecified: Secondary | ICD-10-CM | POA: Diagnosis not present

## 2017-06-07 DIAGNOSIS — I10 Essential (primary) hypertension: Secondary | ICD-10-CM | POA: Diagnosis not present

## 2017-06-07 DIAGNOSIS — I4891 Unspecified atrial fibrillation: Secondary | ICD-10-CM | POA: Diagnosis not present

## 2017-06-08 DIAGNOSIS — Z681 Body mass index (BMI) 19 or less, adult: Secondary | ICD-10-CM | POA: Diagnosis not present

## 2017-06-08 DIAGNOSIS — R5382 Chronic fatigue, unspecified: Secondary | ICD-10-CM | POA: Diagnosis not present

## 2017-06-08 DIAGNOSIS — R531 Weakness: Secondary | ICD-10-CM | POA: Diagnosis not present

## 2017-06-08 DIAGNOSIS — R636 Underweight: Secondary | ICD-10-CM | POA: Diagnosis not present

## 2017-06-08 DIAGNOSIS — M2550 Pain in unspecified joint: Secondary | ICD-10-CM | POA: Diagnosis not present

## 2017-06-08 NOTE — Progress Notes (Signed)
Cardiology Office Note   Date:  06/09/2017   ID:  Charlene Duncan, DOB January 16, 1936, MRN [id number]  PCP:  [name], MD  Cardiologist:   Jacara Benito [place], MD   Chief Complaint  Patient presents with  . Edema    pt states in her left ankle but has gone down now   . Atrial Fibrillation      History of Present Illness: Charlene Duncan is a 81 y.o. female seen at the request of Dr. Stephanie Acre for evaluation of irregular heart beat/?Afib. She has a history of HTN, HLD, and hypothyroidism. She was seen recently by Dr. Stephanie Acre for evaluation of weakness, weight loss, poor appetite, edema, and painful legs. Ecg was interpreted as Afib/flutter with rate 101. Noted to have inflammation of right wrist and swelling of legs. Given Medrol dose pack. CRP and ESR were elevated. BNP 323. Seen in ED 2 days later. Ecg showed NSR with PACs. BNP down to 188.   She reports no symptoms of palpitations, dizziness, syncope, orthopnea, chest pain, or SOB. Seen by Rheumatologist yesterday. Leg swelling resolved. Weakness is much improved. No history of CVA. Recent Dx of anemia.     Past Medical History:  Diagnosis Date  . Alzheimer disease 05/16/2017  . Anemia   . Breast CA ([place])   . Cancer (HCC)    breast  . High cholesterol   . Iron deficiency   . Meniere disease   . Myalgia   . OA (osteoarthritis) of knee   . Thyroid disease    hypothyroid  . Vitamin B 12 deficiency     Past Surgical History:  Procedure Laterality Date  . BREAST LUMPECTOMY       Current Outpatient Prescriptions  Medication Sig Dispense Refill  . acetaZOLAMIDE (DIAMOX) 250 MG tablet Take 250 mg by mouth as needed.    [name] atorvastatin (LIPITOR) 10 MG tablet Take 10 mg by mouth daily.    . celecoxib (CELEBREX) 200 MG capsule Take 200 mg by mouth as directed.     . Cholecalciferol (VITAMIN D PO) Take 1 tablet by mouth daily.    [name] donepezil (ARICEPT) 5 MG tablet Take 1 tablet (5 mg total) by mouth at bedtime. 30 tablet 1  .  DULoxetine (CYMBALTA) 30 MG capsule Take 1 capsule by mouth daily.    . ferrous fumarate (HEMOCYTE - 106 MG FE) 325 (106 Fe) MG TABS tablet Take 325 mg by mouth daily.    . Iron-FA-B Cmp-C-Biot-Probiotic (FUSION PLUS PO) Take 1 capsule by mouth daily.    [name] levothyroxine (SYNTHROID, LEVOTHROID) 88 MCG tablet Take 88 mcg by mouth every other day.     . predniSONE (DELTASONE) 10 MG tablet Take 1 tablet by mouth daily.    . vitamin B-12 (CYANOCOBALAMIN) 500 MCG tablet Take 500 mcg by mouth daily.     No current facility-administered medications for this visit.     Allergies:   Naproxen    Social History:  The patient  reports that she has never smoked. She has never used smokeless tobacco. She reports that she does not drink alcohol or use drugs.   Family History:  The patient's family history includes Arthritis in her mother; Cirrhosis in her brother and sister; Diabetes in her mother.    ROS:  Please see the history of present illness.   Otherwise, review of systems are positive for none.   All other systems are reviewed and negative.    PHYSICAL EXAM: VS:  BP [name])  92/48   Pulse 75   Ht _0  (1.676 m)   Wt 108 lb 12.8 oz (49.4 kg)   BMI 17.56 kg/m  , BMI Body mass index is 17.56 kg/m. GEN: Well nourished, well developed, in no acute distress  HEENT: normal  Neck: no JVD, carotid bruits, or masses Cardiac: RRR; No edema. Normal S1-2. Soft 4-7/5 systolic murmur RUSB>>LSB.  Respiratory:  clear to auscultation bilaterally, normal work of breathing GI: soft, nontender, nondistended, + BS MS: no deformity or atrophy  Skin: warm and dry, no rash Neuro:  Strength and sensation are intact Psych: euthymic mood, full affect   EKG:  EKG is ordered today. The ekg ordered today demonstrates NSR rate 75, Normal Ecg. I have personally reviewed and interpreted this study.   Recent Labs: 05/05/2017: B Natriuretic Peptide 188.4 05/20/2017: BUN 27; Creatinine, Ser 0.66; Hemoglobin 10.3;  Platelets 442; Potassium 3.9; Sodium 135    Lipid Panel No results found for: CHOL, TRIG, HDL, CHOLHDL, VLDL, LDLCALC, LDLDIRECT    Wt Readings from Last 3 Encounters:  06/09/17 108 lb 12.8 oz (49.4 kg)  05/16/17 111 lb 8 oz (50.6 kg)      Other studies Reviewed: Additional studies/ records that were reviewed today include: I personally reviewed Ecg from 05/03/17 and 05/05/17. These both show NSR with PACs. The baseline on 6/5 is poor related to motion. P waves are visible.     ASSESSMENT AND PLAN:  1. PACs. Review of all Ecgs show NSR with PACs. I don't see evidence of Afib. She is asymptomatic. Recommend she stop Xarelto. No further work up needed. 2. Edema. Associated with inflammatory condition with markedly elevated CRP and sed rate. Edema resolved with steroids. No clinical evidence of CHF.  3. Murmur. Exam consistent with innocent flow murmur versus mild MR. No symptoms. I would not recommend further evaluation.    Current medicines are reviewed at length with the patient today.  The patient does not have concerns regarding medicines.  The following changes have been made:  Stop Xarelto  Labs/ tests ordered today include:   Orders Placed This Encounter  Procedures  . EKG 12-Lead     Disposition:   FU with me PRN   Signed, Armari Fussell Martin[place], MD  06/09/2017 5:00 PM    [facility] [address], [place], [place], [address] Phone [phone], Fax [phone]

## 2017-06-09 ENCOUNTER — Encounter: Payer: Self-pay | Admitting: Cardiology

## 2017-06-09 ENCOUNTER — Ambulatory Visit (INDEPENDENT_AMBULATORY_CARE_PROVIDER_SITE_OTHER): Payer: Medicare Other | Admitting: Cardiology

## 2017-06-09 VITALS — BP 92/48 | HR 75 | Ht 66.0 in | Wt 108.8 lb

## 2017-06-09 DIAGNOSIS — I491 Atrial premature depolarization: Secondary | ICD-10-CM | POA: Diagnosis not present

## 2017-06-09 DIAGNOSIS — R601 Generalized edema: Secondary | ICD-10-CM

## 2017-06-09 NOTE — Patient Instructions (Signed)
Stop taking Xarelto   Continue your other therapy  I will see you as needed

## 2017-06-10 DIAGNOSIS — M170 Bilateral primary osteoarthritis of knee: Secondary | ICD-10-CM | POA: Diagnosis not present

## 2017-06-10 DIAGNOSIS — E039 Hypothyroidism, unspecified: Secondary | ICD-10-CM | POA: Diagnosis not present

## 2017-06-10 DIAGNOSIS — G309 Alzheimer's disease, unspecified: Secondary | ICD-10-CM | POA: Diagnosis not present

## 2017-06-10 DIAGNOSIS — I4891 Unspecified atrial fibrillation: Secondary | ICD-10-CM | POA: Diagnosis not present

## 2017-06-10 DIAGNOSIS — E538 Deficiency of other specified B group vitamins: Secondary | ICD-10-CM | POA: Diagnosis not present

## 2017-06-10 DIAGNOSIS — D649 Anemia, unspecified: Secondary | ICD-10-CM | POA: Diagnosis not present

## 2017-06-10 DIAGNOSIS — R531 Weakness: Secondary | ICD-10-CM | POA: Diagnosis not present

## 2017-06-10 DIAGNOSIS — I10 Essential (primary) hypertension: Secondary | ICD-10-CM | POA: Diagnosis not present

## 2017-06-10 DIAGNOSIS — R413 Other amnesia: Secondary | ICD-10-CM | POA: Diagnosis not present

## 2017-06-10 DIAGNOSIS — M11261 Other chondrocalcinosis, right knee: Secondary | ICD-10-CM | POA: Diagnosis not present

## 2017-06-13 DIAGNOSIS — I10 Essential (primary) hypertension: Secondary | ICD-10-CM | POA: Diagnosis not present

## 2017-06-13 DIAGNOSIS — M11261 Other chondrocalcinosis, right knee: Secondary | ICD-10-CM | POA: Diagnosis not present

## 2017-06-13 DIAGNOSIS — M170 Bilateral primary osteoarthritis of knee: Secondary | ICD-10-CM | POA: Diagnosis not present

## 2017-06-13 DIAGNOSIS — G309 Alzheimer's disease, unspecified: Secondary | ICD-10-CM | POA: Diagnosis not present

## 2017-06-13 DIAGNOSIS — D649 Anemia, unspecified: Secondary | ICD-10-CM | POA: Diagnosis not present

## 2017-06-13 DIAGNOSIS — I4891 Unspecified atrial fibrillation: Secondary | ICD-10-CM | POA: Diagnosis not present

## 2017-06-14 DIAGNOSIS — I4891 Unspecified atrial fibrillation: Secondary | ICD-10-CM | POA: Diagnosis not present

## 2017-06-14 DIAGNOSIS — D649 Anemia, unspecified: Secondary | ICD-10-CM | POA: Diagnosis not present

## 2017-06-14 DIAGNOSIS — M170 Bilateral primary osteoarthritis of knee: Secondary | ICD-10-CM | POA: Diagnosis not present

## 2017-06-14 DIAGNOSIS — M11261 Other chondrocalcinosis, right knee: Secondary | ICD-10-CM | POA: Diagnosis not present

## 2017-06-14 DIAGNOSIS — G309 Alzheimer's disease, unspecified: Secondary | ICD-10-CM | POA: Diagnosis not present

## 2017-06-14 DIAGNOSIS — I10 Essential (primary) hypertension: Secondary | ICD-10-CM | POA: Diagnosis not present

## 2017-06-15 DIAGNOSIS — M170 Bilateral primary osteoarthritis of knee: Secondary | ICD-10-CM | POA: Diagnosis not present

## 2017-06-15 DIAGNOSIS — I10 Essential (primary) hypertension: Secondary | ICD-10-CM | POA: Diagnosis not present

## 2017-06-15 DIAGNOSIS — I4891 Unspecified atrial fibrillation: Secondary | ICD-10-CM | POA: Diagnosis not present

## 2017-06-15 DIAGNOSIS — M11261 Other chondrocalcinosis, right knee: Secondary | ICD-10-CM | POA: Diagnosis not present

## 2017-06-15 DIAGNOSIS — D649 Anemia, unspecified: Secondary | ICD-10-CM | POA: Diagnosis not present

## 2017-06-15 DIAGNOSIS — G309 Alzheimer's disease, unspecified: Secondary | ICD-10-CM | POA: Diagnosis not present

## 2017-06-17 DIAGNOSIS — I4891 Unspecified atrial fibrillation: Secondary | ICD-10-CM | POA: Diagnosis not present

## 2017-06-17 DIAGNOSIS — M11261 Other chondrocalcinosis, right knee: Secondary | ICD-10-CM | POA: Diagnosis not present

## 2017-06-17 DIAGNOSIS — D649 Anemia, unspecified: Secondary | ICD-10-CM | POA: Diagnosis not present

## 2017-06-17 DIAGNOSIS — G309 Alzheimer's disease, unspecified: Secondary | ICD-10-CM | POA: Diagnosis not present

## 2017-06-17 DIAGNOSIS — I10 Essential (primary) hypertension: Secondary | ICD-10-CM | POA: Diagnosis not present

## 2017-06-17 DIAGNOSIS — M170 Bilateral primary osteoarthritis of knee: Secondary | ICD-10-CM | POA: Diagnosis not present

## 2017-06-21 DIAGNOSIS — M170 Bilateral primary osteoarthritis of knee: Secondary | ICD-10-CM | POA: Diagnosis not present

## 2017-06-21 DIAGNOSIS — I10 Essential (primary) hypertension: Secondary | ICD-10-CM | POA: Diagnosis not present

## 2017-06-21 DIAGNOSIS — G309 Alzheimer's disease, unspecified: Secondary | ICD-10-CM | POA: Diagnosis not present

## 2017-06-21 DIAGNOSIS — I4891 Unspecified atrial fibrillation: Secondary | ICD-10-CM | POA: Diagnosis not present

## 2017-06-21 DIAGNOSIS — M11261 Other chondrocalcinosis, right knee: Secondary | ICD-10-CM | POA: Diagnosis not present

## 2017-06-21 DIAGNOSIS — D649 Anemia, unspecified: Secondary | ICD-10-CM | POA: Diagnosis not present

## 2017-06-23 DIAGNOSIS — R531 Weakness: Secondary | ICD-10-CM | POA: Diagnosis not present

## 2017-06-23 DIAGNOSIS — R636 Underweight: Secondary | ICD-10-CM | POA: Diagnosis not present

## 2017-06-23 DIAGNOSIS — R768 Other specified abnormal immunological findings in serum: Secondary | ICD-10-CM | POA: Diagnosis not present

## 2017-06-23 DIAGNOSIS — M0609 Rheumatoid arthritis without rheumatoid factor, multiple sites: Secondary | ICD-10-CM | POA: Diagnosis not present

## 2017-06-23 DIAGNOSIS — M2550 Pain in unspecified joint: Secondary | ICD-10-CM | POA: Diagnosis not present

## 2017-06-23 DIAGNOSIS — R5382 Chronic fatigue, unspecified: Secondary | ICD-10-CM | POA: Diagnosis not present

## 2017-06-23 DIAGNOSIS — Z681 Body mass index (BMI) 19 or less, adult: Secondary | ICD-10-CM | POA: Diagnosis not present

## 2017-06-24 DIAGNOSIS — M170 Bilateral primary osteoarthritis of knee: Secondary | ICD-10-CM | POA: Diagnosis not present

## 2017-06-24 DIAGNOSIS — G309 Alzheimer's disease, unspecified: Secondary | ICD-10-CM | POA: Diagnosis not present

## 2017-06-24 DIAGNOSIS — M11261 Other chondrocalcinosis, right knee: Secondary | ICD-10-CM | POA: Diagnosis not present

## 2017-06-24 DIAGNOSIS — I10 Essential (primary) hypertension: Secondary | ICD-10-CM | POA: Diagnosis not present

## 2017-06-24 DIAGNOSIS — D649 Anemia, unspecified: Secondary | ICD-10-CM | POA: Diagnosis not present

## 2017-06-24 DIAGNOSIS — I4891 Unspecified atrial fibrillation: Secondary | ICD-10-CM | POA: Diagnosis not present

## 2017-06-29 DIAGNOSIS — G309 Alzheimer's disease, unspecified: Secondary | ICD-10-CM | POA: Diagnosis not present

## 2017-06-29 DIAGNOSIS — D649 Anemia, unspecified: Secondary | ICD-10-CM | POA: Diagnosis not present

## 2017-06-29 DIAGNOSIS — I4891 Unspecified atrial fibrillation: Secondary | ICD-10-CM | POA: Diagnosis not present

## 2017-06-29 DIAGNOSIS — M170 Bilateral primary osteoarthritis of knee: Secondary | ICD-10-CM | POA: Diagnosis not present

## 2017-06-29 DIAGNOSIS — I10 Essential (primary) hypertension: Secondary | ICD-10-CM | POA: Diagnosis not present

## 2017-06-29 DIAGNOSIS — M11261 Other chondrocalcinosis, right knee: Secondary | ICD-10-CM | POA: Diagnosis not present

## 2017-07-05 DIAGNOSIS — I10 Essential (primary) hypertension: Secondary | ICD-10-CM | POA: Diagnosis not present

## 2017-07-05 DIAGNOSIS — M11261 Other chondrocalcinosis, right knee: Secondary | ICD-10-CM | POA: Diagnosis not present

## 2017-07-05 DIAGNOSIS — I4891 Unspecified atrial fibrillation: Secondary | ICD-10-CM | POA: Diagnosis not present

## 2017-07-05 DIAGNOSIS — G309 Alzheimer's disease, unspecified: Secondary | ICD-10-CM | POA: Diagnosis not present

## 2017-07-05 DIAGNOSIS — M170 Bilateral primary osteoarthritis of knee: Secondary | ICD-10-CM | POA: Diagnosis not present

## 2017-07-05 DIAGNOSIS — D649 Anemia, unspecified: Secondary | ICD-10-CM | POA: Diagnosis not present

## 2017-07-21 ENCOUNTER — Other Ambulatory Visit: Payer: Self-pay | Admitting: Neurology

## 2017-07-29 DIAGNOSIS — E785 Hyperlipidemia, unspecified: Secondary | ICD-10-CM | POA: Diagnosis not present

## 2017-07-29 DIAGNOSIS — E039 Hypothyroidism, unspecified: Secondary | ICD-10-CM | POA: Diagnosis not present

## 2017-07-29 DIAGNOSIS — R413 Other amnesia: Secondary | ICD-10-CM | POA: Diagnosis not present

## 2017-07-29 DIAGNOSIS — I10 Essential (primary) hypertension: Secondary | ICD-10-CM | POA: Diagnosis not present

## 2017-07-29 DIAGNOSIS — R600 Localized edema: Secondary | ICD-10-CM | POA: Diagnosis not present

## 2017-07-29 DIAGNOSIS — M069 Rheumatoid arthritis, unspecified: Secondary | ICD-10-CM | POA: Diagnosis not present

## 2017-07-31 ENCOUNTER — Inpatient Hospital Stay (HOSPITAL_COMMUNITY)
Admission: EM | Admit: 2017-07-31 | Discharge: 2017-08-02 | DRG: 552 | Disposition: A | Discharge status: Rehabilitation Facility | Payer: Medicare Other | Attending: Family Medicine | Admitting: Family Medicine

## 2017-07-31 ENCOUNTER — Emergency Department (HOSPITAL_COMMUNITY): Payer: Medicare Other

## 2017-07-31 ENCOUNTER — Encounter (HOSPITAL_COMMUNITY): Payer: Self-pay

## 2017-07-31 DIAGNOSIS — S32010A Wedge compression fracture of first lumbar vertebra, initial encounter for closed fracture: Secondary | ICD-10-CM | POA: Diagnosis not present

## 2017-07-31 DIAGNOSIS — G309 Alzheimer's disease, unspecified: Secondary | ICD-10-CM | POA: Diagnosis present

## 2017-07-31 DIAGNOSIS — Z853 Personal history of malignant neoplasm of breast: Secondary | ICD-10-CM

## 2017-07-31 DIAGNOSIS — E031 Congenital hypothyroidism without goiter: Secondary | ICD-10-CM | POA: Diagnosis not present

## 2017-07-31 DIAGNOSIS — D509 Iron deficiency anemia, unspecified: Secondary | ICD-10-CM | POA: Diagnosis present

## 2017-07-31 DIAGNOSIS — Z7952 Long term (current) use of systemic steroids: Secondary | ICD-10-CM

## 2017-07-31 DIAGNOSIS — M4856XA Collapsed vertebra, not elsewhere classified, lumbar region, initial encounter for fracture: Secondary | ICD-10-CM | POA: Diagnosis present

## 2017-07-31 DIAGNOSIS — S32501A Unspecified fracture of right pubis, initial encounter for closed fracture: Secondary | ICD-10-CM | POA: Diagnosis not present

## 2017-07-31 DIAGNOSIS — T1490XA Injury, unspecified, initial encounter: Secondary | ICD-10-CM

## 2017-07-31 DIAGNOSIS — W19XXXA Unspecified fall, initial encounter: Secondary | ICD-10-CM | POA: Diagnosis present

## 2017-07-31 DIAGNOSIS — Y92009 Unspecified place in unspecified non-institutional (private) residence as the place of occurrence of the external cause: Secondary | ICD-10-CM

## 2017-07-31 DIAGNOSIS — E785 Hyperlipidemia, unspecified: Secondary | ICD-10-CM | POA: Diagnosis present

## 2017-07-31 DIAGNOSIS — M4854XA Collapsed vertebra, not elsewhere classified, thoracic region, initial encounter for fracture: Secondary | ICD-10-CM | POA: Diagnosis present

## 2017-07-31 DIAGNOSIS — M069 Rheumatoid arthritis, unspecified: Secondary | ICD-10-CM | POA: Diagnosis present

## 2017-07-31 DIAGNOSIS — S32591A Other specified fracture of right pubis, initial encounter for closed fracture: Secondary | ICD-10-CM | POA: Diagnosis not present

## 2017-07-31 DIAGNOSIS — Z79899 Other long term (current) drug therapy: Secondary | ICD-10-CM

## 2017-07-31 DIAGNOSIS — S0990XA Unspecified injury of head, initial encounter: Secondary | ICD-10-CM | POA: Diagnosis not present

## 2017-07-31 DIAGNOSIS — F0280 Dementia in other diseases classified elsewhere without behavioral disturbance: Secondary | ICD-10-CM | POA: Diagnosis present

## 2017-07-31 DIAGNOSIS — R51 Headache: Secondary | ICD-10-CM | POA: Diagnosis not present

## 2017-07-31 DIAGNOSIS — S32018A Other fracture of first lumbar vertebra, initial encounter for closed fracture: Secondary | ICD-10-CM | POA: Diagnosis not present

## 2017-07-31 DIAGNOSIS — M810 Age-related osteoporosis without current pathological fracture: Secondary | ICD-10-CM | POA: Diagnosis present

## 2017-07-31 DIAGNOSIS — R9389 Abnormal findings on diagnostic imaging of other specified body structures: Secondary | ICD-10-CM

## 2017-07-31 DIAGNOSIS — E7800 Pure hypercholesterolemia, unspecified: Secondary | ICD-10-CM | POA: Diagnosis present

## 2017-07-31 DIAGNOSIS — M545 Low back pain: Secondary | ICD-10-CM | POA: Diagnosis not present

## 2017-07-31 DIAGNOSIS — E611 Iron deficiency: Secondary | ICD-10-CM | POA: Diagnosis present

## 2017-07-31 DIAGNOSIS — Z66 Do not resuscitate: Secondary | ICD-10-CM | POA: Diagnosis present

## 2017-07-31 DIAGNOSIS — D649 Anemia, unspecified: Secondary | ICD-10-CM

## 2017-07-31 DIAGNOSIS — Z23 Encounter for immunization: Secondary | ICD-10-CM

## 2017-07-31 DIAGNOSIS — R739 Hyperglycemia, unspecified: Secondary | ICD-10-CM | POA: Diagnosis present

## 2017-07-31 LAB — COMPREHENSIVE METABOLIC PANEL
ALT: 28 U/L (ref 14–54)
AST: 31 U/L (ref 15–41)
Albumin: 3.7 g/dL (ref 3.5–5.0)
Alkaline Phosphatase: 59 U/L (ref 38–126)
Anion gap: 11 (ref 5–15)
BILIRUBIN TOTAL: 0.9 mg/dL (ref 0.3–1.2)
BUN: 23 mg/dL — AB (ref 6–20)
CHLORIDE: 100 mmol/L — AB (ref 101–111)
CO2: 24 mmol/L (ref 22–32)
CREATININE: 0.83 mg/dL (ref 0.44–1.00)
Calcium: 9.4 mg/dL (ref 8.9–10.3)
GFR calc Af Amer: >60 mL/min (ref >60)
Glucose, Bld: 129 mg/dL — ABNORMAL HIGH (ref 65–99)
Potassium: 3.9 mmol/L (ref 3.5–5.1)
Sodium: 135 mmol/L (ref 135–145)
TOTAL PROTEIN: 6.8 g/dL (ref 6.5–8.1)

## 2017-07-31 LAB — CBC
HCT: 36.3 % (ref 36.0–46.0)
HEMOGLOBIN: 11.7 g/dL — AB (ref 12.0–15.0)
MCH: 31.8 pg (ref 26.0–34.0)
MCHC: 32.2 g/dL (ref 30.0–36.0)
MCV: 98.6 fL (ref 78.0–100.0)
Platelets: 285 10*3/uL (ref 150–400)
RBC: 3.68 MIL/uL — ABNORMAL LOW (ref 3.87–5.11)
RDW: 15.6 % — AB (ref 11.5–15.5)
WBC: 10.9 10*3/uL — ABNORMAL HIGH (ref 4.0–10.5)

## 2017-07-31 MED ORDER — ACETAMINOPHEN 500 MG PO TABS
1000.0000 mg | ORAL_TABLET | Freq: Once | ORAL | Status: AC
  Administered 2017-07-31: 1000 mg via ORAL
  Filled 2017-07-31: qty 2

## 2017-07-31 MED ORDER — IOPAMIDOL (ISOVUE-300) INJECTION 61%
INTRAVENOUS | Status: AC
  Administered 2017-07-31: 100 mL
  Filled 2017-07-31: qty 100

## 2017-07-31 MED ORDER — MORPHINE SULFATE (PF) 4 MG/ML IV SOLN
2.0000 mg | Freq: Once | INTRAVENOUS | Status: AC
  Administered 2017-07-31: 2 mg via INTRAVENOUS
  Filled 2017-07-31: qty 1

## 2017-07-31 MED ORDER — TETANUS-DIPHTH-ACELL PERTUSSIS 5-2.5-18.5 LF-MCG/0.5 IM SUSP
0.5000 mL | Freq: Once | INTRAMUSCULAR | Status: AC
  Administered 2017-07-31: 0.5 mL via INTRAMUSCULAR
  Filled 2017-07-31: qty 0.5

## 2017-07-31 NOTE — ED Triage Notes (Signed)
Patient here with xrays that show pelvic fx after falling out of bed yesterday. Patient complains of lower back pain. Hx of dementia. Alert on arrival

## 2017-07-31 NOTE — ED Notes (Signed)
Pt unable to ambulate.  Reported hip pain while standing.  Pt was unable to take one step forward.  Dr. Tyrone Nine is aware.

## 2017-07-31 NOTE — ED Provider Notes (Signed)
[facility] DEPT Provider Note   CSN: [id number] Arrival date & time: 07/31/17  1513     History   Chief Complaint Chief Complaint  Patient presents with  . pelvic fx    HPI Charlene Duncan is a 81 y.o. female.  81 yo F with a chief complaint of a fall. The patient fell yesterday. She tried to get out of the car on her own and fell to the ground. Complaining of pain to the right pelvic region. This morning she was unable to get up out of bed and so they took her to urgent care. There she was noted to have a pelvic fracture. She was sent here for further evaluation. She struck her head when she fell as well. Denies any increased confusion or vomiting. Denies chest pain shortness of breath or abdominal pain. Denies lower extremity or upper extremity pain.   The history is provided by the patient.  Illness  This is a new problem. The current episode started yesterday. The problem occurs constantly. The problem has not changed since onset.Pertinent negatives include no chest pain, no headaches and no shortness of breath. Nothing aggravates the symptoms. Nothing relieves the symptoms. She has tried nothing for the symptoms. The treatment provided no relief.    Past Medical History:  Diagnosis Date  . Alzheimer disease 05/16/2017  . Anemia   . Breast CA ([place])   . Cancer (H[facility])    breast  . High cholesterol   . Iron deficiency   . Meniere disease   . Myalgia   . OA (osteoarthritis) of knee   . Thyroid disease    hypothyroid  . Vitamin B 12 deficiency     Patient Active Problem List   Diagnosis Date Noted  . Compression fracture of first lumbar vertebra ([place]) 07/31/2017  . Alzheimer disease 05/16/2017  . Arthritis 06/16/2014  . Congenital hypothyroidism without goiter 06/16/2014  . History of breast cancer 06/16/2014  . Hyperlipidemia 06/16/2014  . Iron deficiency 06/16/2014    Past Surgical History:  Procedure Laterality Date  . BREAST LUMPECTOMY      OB History     No data available       Home Medications    Prior to Admission medications   Medication Sig Start Date End Date Taking? Authorizing Provider  acetaZOLAMIDE (DIAMOX) 250 MG tablet Take 250 mg by mouth as needed (Meniere's).    Yes [provider]  celecoxib (CELEBREX) 200 MG capsule Take 200 mg by mouth daily as needed for mild pain.    Yes [provider]  Cholecalciferol (VITAMIN D PO) Take 1 tablet by mouth daily.   Yes [provider]  DULoxetine (CYMBALTA) 30 MG capsule Take 30 mg by mouth daily.  06/04/17  Yes [provider]  Iron-FA-B Cmp-C-Biot-Probiotic (FUSION PLUS PO) Take 1 capsule by mouth daily.   Yes [provider]  levothyroxine (SYNTHROID, LEVOTHROID) 88 MCG tablet Take 88 mcg by mouth every other day.  03/21/17  Yes [provider]  methotrexate 2.5 MG tablet Take 10 mg by mouth once a week. THURSDAYS 07/11/17  Yes [provider]  predniSONE (DELTASONE) 10 MG tablet Take 5 mg by mouth daily.  06/07/17  Yes [provider]  vitamin B-12 (CYANOCOBALAMIN) 500 MCG tablet Take 500 mcg by mouth daily.   Yes [provider]  donepezil (ARICEPT) 5 MG tablet TAKE 1 TABLET BY MOUTH AT BEDTIME Patient not taking: Reported on 07/31/2017 07/21/17   [name],  MD    Family History Family History  Problem Relation Age of Onset  . Arthritis Mother   . Diabetes Mother   . Cirrhosis Sister   . Cirrhosis Brother     Social History Social History  Substance Use Topics  . Smoking status: Never Smoker  . Smokeless tobacco: Never Used  . Alcohol use No     Allergies   Naproxen   Review of Systems Review of Systems  Constitutional: Negative for chills and fever.  HENT: Negative for congestion and rhinorrhea.   Eyes: Negative for redness and visual disturbance.  Respiratory: Negative for shortness of breath and wheezing.   Cardiovascular: Negative for chest pain and palpitations.    Gastrointestinal: Negative for nausea and vomiting.  Genitourinary: Negative for dysuria and urgency.  Musculoskeletal: Positive for arthralgias and myalgias.  Skin: Negative for pallor and wound.  Neurological: Negative for dizziness and headaches.     Physical Exam Updated Vital Signs BP 112/63   Pulse 78   Temp 98.7 F (37.1 C) (Oral)   Resp 16   SpO2 94%   Physical Exam  Constitutional: She is oriented to person, place, and time. She appears well-developed and well-nourished. No distress.  HENT:  Head: Normocephalic.  Bruising to the right face  Eyes: Pupils are equal, round, and reactive to light. EOM are normal.  Neck: Normal range of motion. Neck supple.  Cardiovascular: Normal rate and regular rhythm.  Exam reveals no gallop and no friction rub.   No murmur heard. Pulmonary/Chest: Effort normal. She has no wheezes. She has no rales.  Abdominal: Soft. She exhibits no distension. There is no tenderness.  Musculoskeletal: She exhibits tenderness. She exhibits no edema.  Tenderness with palpation of the pubic symphysis as well as the right SI joint.  Abrasion to right arm  Neurological: She is alert and oriented to person, place, and time.  Skin: Skin is warm and dry. She is not diaphoretic.  Psychiatric: She has a normal mood and affect. Her behavior is normal.  Nursing note and vitals reviewed.    ED Treatments / Results  Labs (all labs ordered are listed, but only abnormal results are displayed) Labs Reviewed  COMPREHENSIVE METABOLIC PANEL - Abnormal; Notable for the following:       Result Value   Chloride 100 (*)    Glucose, Bld 129 (*)    BUN 23 (*)    All other components within normal limits  CBC - Abnormal; Notable for the following:    WBC 10.9 (*)    RBC 3.68 (*)    Hemoglobin 11.7 (*)    RDW 15.6 (*)    All other components within normal limits    EKG  EKG Interpretation None       Radiology Ct Head Wo Contrast  Result Date:  07/31/2017 CLINICAL DATA:  81 year old female status post fall with headache. EXAM: CT HEAD WITHOUT CONTRAST TECHNIQUE: Contiguous axial images were obtained from the base of the skull through the vertex without intravenous contrast. COMPARISON:  Head CT 05/05/2017 and earlier. FINDINGS: Brain: Stable cerebral volume and ventriculomegaly. There is associated chronic mesial temporal lobe and anterior temporal tip volume loss. No midline shift, mass effect, evidence of mass lesion, intracranial hemorrhage or evidence of cortically based acute infarction. Gray-white matter differentiation is within normal limits throughout the brain. No focal encephalomalacia identified. Vascular: Calcified atherosclerosis at the skull base. No suspicious intracranial vascular hyperdensity. Skull: No skull fracture identified. No acute osseous abnormality identified.  Sinuses/Orbits: Visualized paranasal sinuses and mastoids are stable and well pneumatized; chronic mild opacification of the inferior right mastoid air cells. Other: No scalp hematoma identified. Negative visible orbit soft tissues and deep soft tissue spaces of the face. IMPRESSION: 1. No acute intracranial abnormality. No acute traumatic injury identified. 2. Stable chronic ventriculomegaly, perhaps ex vacuo related. Electronically Signed   By: Genevie Ann M.D.   On: 07/31/2017 21:09   Ct Abdomen Pelvis W Contrast  Result Date: 07/31/2017 CLINICAL DATA:  Back pain. Known pelvic fracture after falling out of bed yesterday. EXAM: CT ABDOMEN AND PELVIS WITH CONTRAST TECHNIQUE: Multidetector CT imaging of the abdomen and pelvis was performed using the standard protocol following bolus administration of intravenous contrast. CONTRAST:  146mL ISOVUE-300 IOPAMIDOL (ISOVUE-300) INJECTION 61% COMPARISON:  10/08/2016 FINDINGS: Lower chest: Scarring in the lateral right base. No suspicious focal lung base lesion. No consolidation or effusion. Hepatobiliary: No focal liver abnormality  is seen. No gallstones, gallbladder wall thickening, or biliary dilatation. Pancreas: Mildly atrophic with associated ductal dilatation. No pancreatic parenchymal mass. No inflammation or fluid collection. Spleen: Normal in size without focal abnormality. Adrenals/Urinary Tract: Adrenal glands are unremarkable. Kidneys are normal, without renal calculi, focal lesion, or hydronephrosis. Bladder is unremarkable. Stomach/Bowel: Stomach, small bowel and colon are remarkable only for a generous volume of fluid throughout small bowel and a generous volume of colonic stool. No evidence of bowel obstruction. No focal inflammation of bowel. No extraluminal gas. Vascular/Lymphatic: The abdominal aorta is normal in caliber with extensive atherosclerotic calcification. No evidence of intra-abdominal vascular injury. No adenopathy in the abdomen or pelvis. Reproductive: Uterus and bilateral adnexa are unremarkable. Other: Mildly displaced mildly comminuted superior and inferior right pubic ramus fractures. Visible sacral fracture. No diastases of the sacroiliac joints or pubic symphysis. Small pelvic sidewall intramuscular hemorrhage, with mild thickening of the right obturator internus. No drainable hematoma. Musculoskeletal: Multiple compressions of upper lumbar and lower thoracic vertebrae. The L1 compression appears to be acute. See accompanying lumbar spine CT for details. No significant focal bone lesions. IMPRESSION: 1. Acute fractures of the right pubic rami, mildly comminuted. No drainable hematoma. No SI or pubic symphysis diastases. 2. Acute L1 vertebral compression. Multiple chronic lumbar and lower thoracic compressions. 3. Pancreatic atrophy.  No pancreatic mass or inflammation. 4. Aortic atherosclerosis. 5. No intra-abdominal vascular injury. No parenchymal organ injury. No peritoneal blood or free air. 6. Irregular opacity in the lateral right lung base, likely scarring although not conclusively characterized.  Attention to this area on follow-up is recommended to assure stability. Electronically Signed   By: Andreas Newport M.D.   On: 07/31/2017 21:31   Ct L-spine No Charge  Result Date: 07/31/2017 CLINICAL DATA:  Trauma. Lumbosacral fracture. Fall out of bed today with lumbosacral back pain. EXAM: CT LUMBAR SPINE WITHOUT CONTRAST TECHNIQUE: Multidetector CT imaging of the lumbar spine was performed without intravenous contrast administration. Multiplanar CT image reconstructions were also generated. Imaging reformatted from abdominopelvic CT. COMPARISON:  Lumbar spine radiographs 10/08/2016. Abdominopelvic CT performed concurrently. FINDINGS: Segmentation: 5 lumbar type vertebrae. Alignment: Chronic grade 1 anterolisthesis of L4 on L5. Alignment is otherwise maintained. Vertebrae: Acute compression fracture with approximately 30% loss of height of superior endplate. Minimal extension of posterior cortex without significant retropulsion. No posterior element extension. L2 compression fracture is chronic, however progressed from prior radiograph with increased loss of height centrally and involving the inferior endplate. Minimal involvement of the posterior cortex without retropulsion. Mild compression fractures superior endplate of X41 and  T12 of unknown acuity, but new from 11/30 September 2016 radiograph. No posterior cortex involvement. No focal bone lesion. Paraspinal and other soft tissues: Perispinal musculature is intact without hematoma. Intra-abdominal structures better assessed on dedicated abdominal CT. Disc levels: Disc space narrowing and endplate spurring at B2-W4 and L5-S1, similar in degree to prior exam. Facet arthropathy at L4-L5. No narrowing of the spinal canal. IMPRESSION: 1. Acute L1 compression fracture with approximately 30% loss of height. Minimal posterior cortex involvement without retropulsion. 2. Moderate L2 compression fracture is chronic, but has progressed from comparison radiograph of  November 2017. Progressive loss of height centrally with new inferior endplate component. 3. Mild T11 and T12 superior endplate compression fractures, new from prior radiograph but age indeterminate. Electronically Signed   By: Jeb Levering M.D.   On: 07/31/2017 21:45    Procedures Procedures (including critical care time)  Medications Ordered in ED Medications  acetaminophen (TYLENOL) tablet 1,000 mg (1,000 mg Oral Given 07/31/17 2004)  morphine 4 MG/ML injection 2 mg (2 mg Intravenous Given 07/31/17 2004)  Tdap (BOOSTRIX) injection 0.5 mL (0.5 mLs Intramuscular Given 07/31/17 2004)  iopamidol (ISOVUE-300) 61 % injection (100 mLs  Contrast Given 07/31/17 2044)     Initial Impression / Assessment and Plan / ED Course  I have reviewed the triage vital signs and the nursing notes.  Pertinent labs & imaging results that were available during my care of the patient were reviewed by me and considered in my medical decision making (see chart for details).     81 yo F With a chief complaint of a fall. Sounds mechanical in nature. Patient unfortunately was diagnosed with a pelvic fracture at an urgent care center. The family states that she has been unable to ambulate. I'll obtain a CT scan of the pelvis to evaluate for other pathology give the patient pain medicine and attempt to ambulate.  The patient was unable to be brought to standing position. I feel that it would be difficult to be able to let the patient go home and this condition. I will discuss with the hospitalist. Discussed with ortho, Dr. Rolena Infante. He'll see the patient in the morning. Discussed with the hospitalist will admit.   The patients results and plan were reviewed and discussed.   Any x-rays performed were independently reviewed by myself.   Differential diagnosis were considered with the presenting HPI.  Medications  acetaminophen (TYLENOL) tablet 1,000 mg (1,000 mg Oral Given 07/31/17 2004)  morphine 4 MG/ML injection 2 mg (2  mg Intravenous Given 07/31/17 2004)  Tdap (BOOSTRIX) injection 0.5 mL (0.5 mLs Intramuscular Given 07/31/17 2004)  iopamidol (ISOVUE-300) 61 % injection (100 mLs  Contrast Given 07/31/17 2044)    Vitals:   07/31/17 2200 07/31/17 2215 07/31/17 2245 07/31/17 2300  BP: 119/65 121/70 111/62 112/63  Pulse: 86 84 76 78  Resp:      Temp:      TempSrc:      SpO2: 97% 95% 93% 94%    Final diagnoses:  Closed fracture of multiple pubic rami, right, initial encounter ([place])  Closed compression fracture of first lumbar vertebra, initial encounter [facility])    Admission/ observation were discussed with the admitting physician, patient and/or family and they are comfortable with the plan.    Final Clinical Impressions(s) / ED Diagnoses   Final diagnoses:  Closed fracture of multiple pubic rami, right, initial encounter ([place])  Closed compression fracture of first lumbar vertebra, initial encounter [facility])    New Prescriptions  New Prescriptions   No medications on file     Deno Etienne, DO 07/31/17 2337

## 2017-07-31 NOTE — ED Notes (Signed)
Patient transported to CT 

## 2017-08-01 ENCOUNTER — Inpatient Hospital Stay (HOSPITAL_COMMUNITY): Payer: Medicare Other

## 2017-08-01 ENCOUNTER — Encounter (HOSPITAL_COMMUNITY): Payer: Self-pay | Admitting: Internal Medicine

## 2017-08-01 DIAGNOSIS — W19XXXA Unspecified fall, initial encounter: Secondary | ICD-10-CM | POA: Diagnosis not present

## 2017-08-01 DIAGNOSIS — D649 Anemia, unspecified: Secondary | ICD-10-CM

## 2017-08-01 DIAGNOSIS — D62 Acute posthemorrhagic anemia: Secondary | ICD-10-CM | POA: Diagnosis not present

## 2017-08-01 DIAGNOSIS — W010XXA Fall on same level from slipping, tripping and stumbling without subsequent striking against object, initial encounter: Secondary | ICD-10-CM | POA: Diagnosis not present

## 2017-08-01 DIAGNOSIS — S32018D Other fracture of first lumbar vertebra, subsequent encounter for fracture with routine healing: Secondary | ICD-10-CM | POA: Diagnosis not present

## 2017-08-01 DIAGNOSIS — S32010G Wedge compression fracture of first lumbar vertebra, subsequent encounter for fracture with delayed healing: Secondary | ICD-10-CM | POA: Diagnosis not present

## 2017-08-01 DIAGNOSIS — S32591D Other specified fracture of right pubis, subsequent encounter for fracture with routine healing: Secondary | ICD-10-CM | POA: Diagnosis not present

## 2017-08-01 DIAGNOSIS — Z9181 History of falling: Secondary | ICD-10-CM | POA: Diagnosis not present

## 2017-08-01 DIAGNOSIS — M810 Age-related osteoporosis without current pathological fracture: Secondary | ICD-10-CM | POA: Diagnosis present

## 2017-08-01 DIAGNOSIS — Z853 Personal history of malignant neoplasm of breast: Secondary | ICD-10-CM | POA: Diagnosis not present

## 2017-08-01 DIAGNOSIS — M4856XA Collapsed vertebra, not elsewhere classified, lumbar region, initial encounter for fracture: Secondary | ICD-10-CM | POA: Diagnosis present

## 2017-08-01 DIAGNOSIS — M4854XA Collapsed vertebra, not elsewhere classified, thoracic region, initial encounter for fracture: Secondary | ICD-10-CM | POA: Diagnosis present

## 2017-08-01 DIAGNOSIS — H8109 Meniere's disease, unspecified ear: Secondary | ICD-10-CM | POA: Diagnosis present

## 2017-08-01 DIAGNOSIS — R739 Hyperglycemia, unspecified: Secondary | ICD-10-CM | POA: Diagnosis not present

## 2017-08-01 DIAGNOSIS — S32010S Wedge compression fracture of first lumbar vertebra, sequela: Secondary | ICD-10-CM | POA: Diagnosis not present

## 2017-08-01 DIAGNOSIS — Z9849 Cataract extraction status, unspecified eye: Secondary | ICD-10-CM | POA: Diagnosis not present

## 2017-08-01 DIAGNOSIS — Z79899 Other long term (current) drug therapy: Secondary | ICD-10-CM | POA: Diagnosis not present

## 2017-08-01 DIAGNOSIS — D509 Iron deficiency anemia, unspecified: Secondary | ICD-10-CM | POA: Diagnosis present

## 2017-08-01 DIAGNOSIS — Z886 Allergy status to analgesic agent status: Secondary | ICD-10-CM | POA: Diagnosis not present

## 2017-08-01 DIAGNOSIS — E031 Congenital hypothyroidism without goiter: Secondary | ICD-10-CM | POA: Diagnosis present

## 2017-08-01 DIAGNOSIS — E7800 Pure hypercholesterolemia, unspecified: Secondary | ICD-10-CM | POA: Diagnosis present

## 2017-08-01 DIAGNOSIS — G309 Alzheimer's disease, unspecified: Secondary | ICD-10-CM | POA: Diagnosis present

## 2017-08-01 DIAGNOSIS — Z66 Do not resuscitate: Secondary | ICD-10-CM | POA: Diagnosis present

## 2017-08-01 DIAGNOSIS — R0789 Other chest pain: Secondary | ICD-10-CM | POA: Diagnosis not present

## 2017-08-01 DIAGNOSIS — F0280 Dementia in other diseases classified elsewhere without behavioral disturbance: Secondary | ICD-10-CM | POA: Diagnosis present

## 2017-08-01 DIAGNOSIS — S32591S Other specified fracture of right pubis, sequela: Secondary | ICD-10-CM | POA: Diagnosis not present

## 2017-08-01 DIAGNOSIS — Y92009 Unspecified place in unspecified non-institutional (private) residence as the place of occurrence of the external cause: Secondary | ICD-10-CM | POA: Diagnosis not present

## 2017-08-01 DIAGNOSIS — S32501A Unspecified fracture of right pubis, initial encounter for closed fracture: Secondary | ICD-10-CM | POA: Diagnosis not present

## 2017-08-01 DIAGNOSIS — D519 Vitamin B12 deficiency anemia, unspecified: Secondary | ICD-10-CM | POA: Diagnosis not present

## 2017-08-01 DIAGNOSIS — S32010A Wedge compression fracture of first lumbar vertebra, initial encounter for closed fracture: Secondary | ICD-10-CM

## 2017-08-01 DIAGNOSIS — M4854XD Collapsed vertebra, not elsewhere classified, thoracic region, subsequent encounter for fracture with routine healing: Secondary | ICD-10-CM | POA: Diagnosis present

## 2017-08-01 DIAGNOSIS — I10 Essential (primary) hypertension: Secondary | ICD-10-CM | POA: Diagnosis not present

## 2017-08-01 DIAGNOSIS — Z7952 Long term (current) use of systemic steroids: Secondary | ICD-10-CM | POA: Diagnosis not present

## 2017-08-01 DIAGNOSIS — Z23 Encounter for immunization: Secondary | ICD-10-CM | POA: Diagnosis not present

## 2017-08-01 DIAGNOSIS — M1710 Unilateral primary osteoarthritis, unspecified knee: Secondary | ICD-10-CM | POA: Diagnosis present

## 2017-08-01 DIAGNOSIS — R0989 Other specified symptoms and signs involving the circulatory and respiratory systems: Secondary | ICD-10-CM

## 2017-08-01 DIAGNOSIS — S32018A Other fracture of first lumbar vertebra, initial encounter for closed fracture: Secondary | ICD-10-CM | POA: Diagnosis present

## 2017-08-01 DIAGNOSIS — M069 Rheumatoid arthritis, unspecified: Secondary | ICD-10-CM | POA: Diagnosis present

## 2017-08-01 DIAGNOSIS — E039 Hypothyroidism, unspecified: Secondary | ICD-10-CM | POA: Diagnosis present

## 2017-08-01 DIAGNOSIS — S32010D Wedge compression fracture of first lumbar vertebra, subsequent encounter for fracture with routine healing: Secondary | ICD-10-CM | POA: Diagnosis not present

## 2017-08-01 DIAGNOSIS — S32591A Other specified fracture of right pubis, initial encounter for closed fracture: Secondary | ICD-10-CM | POA: Diagnosis present

## 2017-08-01 DIAGNOSIS — R918 Other nonspecific abnormal finding of lung field: Secondary | ICD-10-CM | POA: Diagnosis not present

## 2017-08-01 DIAGNOSIS — Z8261 Family history of arthritis: Secondary | ICD-10-CM | POA: Diagnosis not present

## 2017-08-01 DIAGNOSIS — W19XXXD Unspecified fall, subsequent encounter: Secondary | ICD-10-CM | POA: Diagnosis present

## 2017-08-01 LAB — CBC
HCT: 31.8 % — ABNORMAL LOW (ref 36.0–46.0)
HEMOGLOBIN: 10.1 g/dL — AB (ref 12.0–15.0)
MCH: 31.6 pg (ref 26.0–34.0)
MCHC: 31.8 g/dL (ref 30.0–36.0)
MCV: 99.4 fL (ref 78.0–100.0)
Platelets: 262 10*3/uL (ref 150–400)
RBC: 3.2 MIL/uL — AB (ref 3.87–5.11)
RDW: 15.7 % — ABNORMAL HIGH (ref 11.5–15.5)
WBC: 9.1 10*3/uL (ref 4.0–10.5)

## 2017-08-01 LAB — COMPREHENSIVE METABOLIC PANEL
ALK PHOS: 50 U/L (ref 38–126)
ALT: 23 U/L (ref 14–54)
ANION GAP: 7 (ref 5–15)
AST: 26 U/L (ref 15–41)
Albumin: 3.1 g/dL — ABNORMAL LOW (ref 3.5–5.0)
BILIRUBIN TOTAL: 0.7 mg/dL (ref 0.3–1.2)
BUN: 21 mg/dL — ABNORMAL HIGH (ref 6–20)
CALCIUM: 8.9 mg/dL (ref 8.9–10.3)
CO2: 28 mmol/L (ref 22–32)
CREATININE: 0.83 mg/dL (ref 0.44–1.00)
Chloride: 102 mmol/L (ref 101–111)
GFR calc non Af Amer: >60 mL/min (ref >60)
GLUCOSE: 95 mg/dL (ref 65–99)
Potassium: 3.6 mmol/L (ref 3.5–5.1)
Sodium: 137 mmol/L (ref 135–145)
TOTAL PROTEIN: 6 g/dL — AB (ref 6.5–8.1)

## 2017-08-01 LAB — HEMOGLOBIN A1C
HEMOGLOBIN A1C: 5.8 % — AB (ref 4.8–5.6)
MEAN PLASMA GLUCOSE: 119.76 mg/dL

## 2017-08-01 MED ORDER — ACETAMINOPHEN 650 MG RE SUPP: 650.0000 mg | Freq: Four times a day (QID) | RECTAL | Status: DC | PRN

## 2017-08-01 MED ORDER — PREDNISONE 5 MG PO TABS
7.5000 mg | ORAL_TABLET | Freq: Every day | ORAL | Status: DC
  Administered 2017-08-01 – 2017-08-02 (×2): 7.5 mg via ORAL
  Filled 2017-08-01 (×2): qty 2

## 2017-08-01 MED ORDER — DULOXETINE HCL 30 MG PO CPEP
30.0000 mg | ORAL_CAPSULE | Freq: Every day | ORAL | Status: DC
  Administered 2017-08-01 – 2017-08-02 (×2): 30 mg via ORAL
  Filled 2017-08-01 (×2): qty 1

## 2017-08-01 MED ORDER — SODIUM CHLORIDE 0.9 % IV SOLN
INTRAVENOUS | Status: AC
  Administered 2017-08-01: 06:00:00 via INTRAVENOUS

## 2017-08-01 MED ORDER — TRAMADOL HCL 50 MG PO TABS
50.0000 mg | ORAL_TABLET | Freq: Four times a day (QID) | ORAL | Status: DC | PRN
  Administered 2017-08-01 – 2017-08-02 (×3): 50 mg via ORAL
  Filled 2017-08-01 (×3): qty 1

## 2017-08-01 MED ORDER — CELECOXIB 200 MG PO CAPS
200.0000 mg | ORAL_CAPSULE | Freq: Every day | ORAL | Status: DC | PRN
  Filled 2017-08-01: qty 1

## 2017-08-01 MED ORDER — VITAMIN B-12 1000 MCG PO TABS
500.0000 ug | ORAL_TABLET | Freq: Every day | ORAL | Status: DC
  Administered 2017-08-01 – 2017-08-02 (×2): 500 ug via ORAL
  Filled 2017-08-01 (×3): qty 1

## 2017-08-01 MED ORDER — ACETAZOLAMIDE 250 MG PO TABS: 250.0000 mg | ORAL_TABLET | ORAL | Status: DC | PRN

## 2017-08-01 MED ORDER — LEVOTHYROXINE SODIUM 88 MCG PO TABS
88.0000 ug | ORAL_TABLET | ORAL | Status: DC
  Administered 2017-08-01: 88 ug via ORAL
  Filled 2017-08-01: qty 1

## 2017-08-01 MED ORDER — ACETAMINOPHEN 325 MG PO TABS
650.0000 mg | ORAL_TABLET | Freq: Four times a day (QID) | ORAL | Status: DC | PRN
  Administered 2017-08-01 – 2017-08-02 (×4): 650 mg via ORAL
  Filled 2017-08-01 (×4): qty 2

## 2017-08-01 MED ORDER — ENOXAPARIN SODIUM 30 MG/0.3ML ~~LOC~~ SOLN
30.0000 mg | SUBCUTANEOUS | Status: DC
  Administered 2017-08-01 – 2017-08-02 (×2): 30 mg via SUBCUTANEOUS
  Filled 2017-08-01 (×2): qty 0.3

## 2017-08-01 NOTE — Consult Note (Signed)
Charlene Pepper, MD Chief Complaint: Low back pain. History:  Charlene Duncan  is a 81 y.o. female, w dementia, new diagnosis of RA, hypothyroidism, on prednisone apparently fell at home.  Family is not sure if she has had a bone density test.  Pt fell Saturday afternoon.  This was an unwittnessed fall out of bed ?[name]  History limited by dementia.  Past Medical History:  Diagnosis Date  . Alzheimer disease 05/16/2017  . Anemia   . Breast CA ([place])   . Cancer ([facility])    breast  . High cholesterol   . Iron deficiency   . Meniere disease   . Myalgia   . OA (osteoarthritis) of knee   . Thyroid disease    hypothyroid  . Vitamin B 12 deficiency     Allergies  Allergen Reactions  . Naproxen Hives    No current facility-administered medications on file prior to encounter.    Current Outpatient Prescriptions on File Prior to Encounter  Medication Sig Dispense Refill  . acetaZOLAMIDE (DIAMOX) 250 MG tablet Take 250 mg by mouth as needed (Meniere's).     . celecoxib (CELEBREX) 200 MG capsule Take 200 mg by mouth daily as needed for mild pain.     . Cholecalciferol (VITAMIN D PO) Take 1 tablet by mouth daily.    . DULoxetine (CYMBALTA) 30 MG capsule Take 30 mg by mouth daily.     . Iron-FA-B Cmp-C-Biot-Probiotic (FUSION PLUS PO) Take 1 capsule by mouth daily.    [name] levothyroxine (SYNTHROID, LEVOTHROID) 88 MCG tablet Take 88 mcg by mouth every other day.     . predniSONE (DELTASONE) 10 MG tablet Take 5 mg by mouth daily.     . vitamin B-12 (CYANOCOBALAMIN) 500 MCG tablet Take 500 mcg by mouth daily.    [name] donepezil (ARICEPT) 5 MG tablet TAKE 1 TABLET BY MOUTH AT BEDTIME (Patient not taking: Reported on 07/31/2017) 30 tablet 2    Physical Exam: Vitals:   08/01/17 0134 08/01/17 0627  BP: (!) 127/57 133/69  Pulse: 86 88  Resp: 16 20  Temp: 98.5 F (36.9 C) 98.5 F (36.9 C)  SpO2: 96% 98%   Patient denies shortness of breath, chest pain.  Abdomen soft and nontender. No incontinence of  bowel or bladder.  Compartments in the lower extremity are soft and nontender. Intact peripheral pulses that are 1+ and symmetrical in the lower extremities. Reflexes 1+ symmetrical knee, Achilles. Negative Babinski test. No clonus. Global lower extremity generalized weakness but no focal deficits on motor strength testing. Sensation light touch is intact. No obvious skin lesions abrasions contusions thoracolumbar spine. No significant pelvic pain with hip range of motion or palpation. Moderate to significant low back pain and lumbar region with palpation.  Image: Ct Head Wo Contrast  Result Date: 07/31/2017 CLINICAL DATA:  81 year old female status post fall with headache. EXAM: CT HEAD WITHOUT CONTRAST TECHNIQUE: Contiguous axial images were obtained from the base of the skull through the vertex without intravenous contrast. COMPARISON:  Head CT 05/05/2017 and earlier. FINDINGS: Brain: Stable cerebral volume and ventriculomegaly. There is associated chronic mesial temporal lobe and anterior temporal tip volume loss. No midline shift, mass effect, evidence of mass lesion, intracranial hemorrhage or evidence of cortically based acute infarction. Gray-white matter differentiation is within normal limits throughout the brain. No focal encephalomalacia identified. Vascular: Calcified atherosclerosis at the skull base. No suspicious intracranial vascular hyperdensity. Skull: No skull fracture identified. No acute osseous abnormality identified. Sinuses/Orbits: Visualized paranasal sinuses  and mastoids are stable and well pneumatized; chronic mild opacification of the inferior right mastoid air cells. Other: No scalp hematoma identified. Negative visible orbit soft tissues and deep soft tissue spaces of the face. IMPRESSION: 1. No acute intracranial abnormality. No acute traumatic injury identified. 2. Stable chronic ventriculomegaly, perhaps ex vacuo related. Electronically Signed   By: Genevie Ann M.D.   On:  07/31/2017 21:09   Ct Abdomen Pelvis W Contrast  Result Date: 07/31/2017 CLINICAL DATA:  Back pain. Known pelvic fracture after falling out of bed yesterday. EXAM: CT ABDOMEN AND PELVIS WITH CONTRAST TECHNIQUE: Multidetector CT imaging of the abdomen and pelvis was performed using the standard protocol following bolus administration of intravenous contrast. CONTRAST:  140mL ISOVUE-300 IOPAMIDOL (ISOVUE-300) INJECTION 61% COMPARISON:  10/08/2016 FINDINGS: Lower chest: Scarring in the lateral right base. No suspicious focal lung base lesion. No consolidation or effusion. Hepatobiliary: No focal liver abnormality is seen. No gallstones, gallbladder wall thickening, or biliary dilatation. Pancreas: Mildly atrophic with associated ductal dilatation. No pancreatic parenchymal mass. No inflammation or fluid collection. Spleen: Normal in size without focal abnormality. Adrenals/Urinary Tract: Adrenal glands are unremarkable. Kidneys are normal, without renal calculi, focal lesion, or hydronephrosis. Bladder is unremarkable. Stomach/Bowel: Stomach, small bowel and colon are remarkable only for a generous volume of fluid throughout small bowel and a generous volume of colonic stool. No evidence of bowel obstruction. No focal inflammation of bowel. No extraluminal gas. Vascular/Lymphatic: The abdominal aorta is normal in caliber with extensive atherosclerotic calcification. No evidence of intra-abdominal vascular injury. No adenopathy in the abdomen or pelvis. Reproductive: Uterus and bilateral adnexa are unremarkable. Other: Mildly displaced mildly comminuted superior and inferior right pubic ramus fractures. Visible sacral fracture. No diastases of the sacroiliac joints or pubic symphysis. Small pelvic sidewall intramuscular hemorrhage, with mild thickening of the right obturator internus. No drainable hematoma. Musculoskeletal: Multiple compressions of upper lumbar and lower thoracic vertebrae. The L1 compression appears  to be acute. See accompanying lumbar spine CT for details. No significant focal bone lesions. IMPRESSION: 1. Acute fractures of the right pubic rami, mildly comminuted. No drainable hematoma. No SI or pubic symphysis diastases. 2. Acute L1 vertebral compression. Multiple chronic lumbar and lower thoracic compressions. 3. Pancreatic atrophy.  No pancreatic mass or inflammation. 4. Aortic atherosclerosis. 5. No intra-abdominal vascular injury. No parenchymal organ injury. No peritoneal blood or free air. 6. Irregular opacity in the lateral right lung base, likely scarring although not conclusively characterized. Attention to this area on follow-up is recommended to assure stability. Electronically Signed   By: Andreas Newport M.D.   On: 07/31/2017 21:31   Ct L-spine No Charge  Result Date: 07/31/2017 CLINICAL DATA:  Trauma. Lumbosacral fracture. Fall out of bed today with lumbosacral back pain. EXAM: CT LUMBAR SPINE WITHOUT CONTRAST TECHNIQUE: Multidetector CT imaging of the lumbar spine was performed without intravenous contrast administration. Multiplanar CT image reconstructions were also generated. Imaging reformatted from abdominopelvic CT. COMPARISON:  Lumbar spine radiographs 10/08/2016. Abdominopelvic CT performed concurrently. FINDINGS: Segmentation: 5 lumbar type vertebrae. Alignment: Chronic grade 1 anterolisthesis of L4 on L5. Alignment is otherwise maintained. Vertebrae: Acute compression fracture with approximately 30% loss of height of superior endplate. Minimal extension of posterior cortex without significant retropulsion. No posterior element extension. L2 compression fracture is chronic, however progressed from prior radiograph with increased loss of height centrally and involving the inferior endplate. Minimal involvement of the posterior cortex without retropulsion. Mild compression fractures superior endplate of A12 and I78 of unknown acuity,  but new from 11/30 September 2016 radiograph. No  posterior cortex involvement. No focal bone lesion. Paraspinal and other soft tissues: Perispinal musculature is intact without hematoma. Intra-abdominal structures better assessed on dedicated abdominal CT. Disc levels: Disc space narrowing and endplate spurring at Z6-X0 and L5-S1, similar in degree to prior exam. Facet arthropathy at L4-L5. No narrowing of the spinal canal. IMPRESSION: 1. Acute L1 compression fracture with approximately 30% loss of height. Minimal posterior cortex involvement without retropulsion. 2. Moderate L2 compression fracture is chronic, but has progressed from comparison radiograph of November 2017. Progressive loss of height centrally with new inferior endplate component. 3. Mild T11 and T12 superior endplate compression fractures, new from prior radiograph but age indeterminate. Electronically Signed   By: Jeb Levering M.D.   On: 07/31/2017 21:45    A/P: Result pleasant elderly woman with increasing low back pain status post fall on Saturday.  Imaging studies reveal compression fracture L1 which appears to be new when compared to previous imaging studies. She also has fractures of L2, T12, T11. Patient also has minimally displaced pubic rami fractures which appear to be new.  She has no focal neurological deficits on clinical exam, and complains primarily of mid lumbar pain. She denies any significant pelvic pain.  At this point the patient can weight-bear as tolerated with assistive devices. Recommend nasal calcitonin to decrease the pain of the osteoporotic compression fractures. If the back pain persists and limits her mobility then I would consider a kyphoplasty.  The pelvic fracture can be treated nonoperatively.  Patient can follow up with me in the office in 1 week. I don't think bracing is required given the fact that this is a stable compression fracture and there is no evidence of neurologic compromise.  Please contact me if further questions or issues  arise.

## 2017-08-01 NOTE — Progress Notes (Signed)
Patient seen and evaluated earlier this a.m. Admission is for pain control. Orthopedic surgery not planning any operations. We'll place on tramadol for pain control and place order for physical therapy evaluation.  Reassess next a.m.  Patient seen and evaluated sitting at bedside Gen.: Patient in no acute distress Cardiovascular: No cyanosis Pulmonary: No increased work of breathing, no wheezes  [place], [place]

## 2017-08-01 NOTE — H&P (Addendum)
TRH H&P   Patient Demographics:    Charlene Duncan, is a 81 y.o. female  MRN: [id number]   DOB - [birth year]  Admit Date - 07/31/2017  Outpatient Primary MD for the patient is [name], MD  Referring MD/NP/PA:  Sherwood Gambler  Outpatient Specialists:  Leafy Kindle NP    Patient coming from: home  Chief Complaint  Patient presents with  . pelvic fx      HPI:    Charlene Duncan  is a 81 y.o. female, w dementia, new diagnosis of RA, hypothyroidism, on prednisone apparently fell at home.  Family is not sure if she has had a bone density test.  Pt fell Saturday afternoon.  This was an unwittnessed fall out of bed ?[name]  History limited by dementia.   In ED, CT scan => IMPRESSION: 1. Acute fractures of the right pubic rami, mildly comminuted. No drainable hematoma. No SI or pubic symphysis diastases. 2. Acute L1 vertebral compression. Multiple chronic lumbar and lower thoracic compressions. 3. Pancreatic atrophy.  No pancreatic mass or inflammation.  4. Aortic atherosclerosis. 5. No intra-abdominal vascular injury. No parenchymal organ injury. No peritoneal blood or free air. 6. Irregular opacity in the lateral right lung base, likely scarring although not conclusively characterized. Attention to this area on follow-up is recommended to assure stability.    Pt will be admitted for pain control.  Orthopedics has been consulted, appreciate input regarding pubic rami and L1 compression fracture.      Review of systems:    In addition to the HPI above,  No Fever-chills, No Headache, No changes with Vision or hearing, No problems swallowing food or Liquids, No Chest pain, Cough or Shortness of Breath, No Abdominal pain, No Nausea or Vommitting, Bowel movements are regular, No Blood in stool or Urine, No dysuria, No new skin rashes or bruises,  No new weakness,  tingling, numbness in any extremity, No recent weight gain or loss, No polyuria, polydypsia or polyphagia, No significant Mental Stressors.  A full 10 point Review of Systems was done, except as stated above, all other Review of Systems were negative.   With Past History of the following :    Past Medical History:  Diagnosis Date  . Alzheimer disease 05/16/2017  . Anemia   . Breast CA ([place])   . Cancer (HCC)    breast  . High cholesterol   . Iron deficiency   . Meniere disease   . Myalgia   . OA (osteoarthritis) of knee   . Thyroid disease    hypothyroid  . Vitamin B 12 deficiency       Past Surgical History:  Procedure Laterality Date  . BREAST LUMPECTOMY    . CATARACT EXTRACTION        Social History:     Social History  Substance Use Topics  . Smoking status: Never Smoker  .  Smokeless tobacco: Never Used  . Alcohol use No     Lives - at home by self  Mobility - walk by self prior to fall   Family History :     Family History  Problem Relation Age of Onset  . Arthritis Mother   . Diabetes Mother   . Cirrhosis Sister   . Cirrhosis Brother       Home Medications:   Prior to Admission medications   Medication Sig Start Date End Date Taking? Authorizing Provider  acetaZOLAMIDE (DIAMOX) 250 MG tablet Take 250 mg by mouth as needed (Meniere's).    Yes [provider]  celecoxib (CELEBREX) 200 MG capsule Take 200 mg by mouth daily as needed for mild pain.    Yes [provider]  Cholecalciferol (VITAMIN D PO) Take 1 tablet by mouth daily.   Yes [provider]  DULoxetine (CYMBALTA) 30 MG capsule Take 30 mg by mouth daily.  06/04/17  Yes [provider]  Iron-FA-B Cmp-C-Biot-Probiotic (FUSION PLUS PO) Take 1 capsule by mouth daily.   Yes [provider]  levothyroxine (SYNTHROID, LEVOTHROID) 88 MCG tablet Take 88 mcg by mouth every other day.  03/21/17  Yes [provider]  methotrexate 2.5 MG tablet  Take 10 mg by mouth once a week. THURSDAYS 07/11/17  Yes [provider]  predniSONE (DELTASONE) 10 MG tablet Take 5 mg by mouth daily.  06/07/17  Yes [provider]  vitamin B-12 (CYANOCOBALAMIN) 500 MCG tablet Take 500 mcg by mouth daily.   Yes [provider]  donepezil (ARICEPT) 5 MG tablet TAKE 1 TABLET BY MOUTH AT BEDTIME Patient not taking: Reported on 07/31/2017 07/21/17   Charlene Ducking, MD     Allergies:     Allergies  Allergen Reactions  . Naproxen Hives     Physical Exam:   Vitals  Blood pressure 112/60, pulse 72, temperature 98.7 F (37.1 C), temperature source Oral, resp. rate 17, SpO2 95 %.   1. General  lying in bed in NAD,    2. Normal affect and insight, Not Suicidal or Homicidal, Awake Alert, Oriented X 3.  3. No F.N deficits, ALL C.Nerves Intact, Strength 5/5 all 4 extremities, Sensation intact all 4 extremities, Plantars down going.  4. Ears and Eyes appear Normal, Conjunctivae clear, PERRLA. Moist Oral Mucosa.  5. Supple Neck, No JVD, No cervical lymphadenopathy appriciated, + right Carotid Bruits.  6. Symmetrical Chest wall movement, Good air movement bilaterally, CTAB.  7. RRR, No Gallops, Rubs or Murmurs, No Parasternal Heave.  8. Positive Bowel Sounds, Abdomen Soft, No tenderness, No organomegaly appriciated,No rebound -guarding or rigidity.  9.  No Cyanosis, Normal Skin Turgor, No Skin Rash or Bruise.  10. Good muscle tone,  joints appear normal , no effusions, Normal ROM.  11. No Palpable Lymph Nodes in Neck or Axillae     Data Review:    CBC  Recent Labs Lab 07/31/17 1607  WBC 10.9*  HGB 11.7*  HCT 36.3  PLT 285  MCV 98.6  MCH 31.8  MCHC 32.2  RDW 15.6*   ------------------------------------------------------------------------------------------------------------------  Chemistries   Recent Labs Lab 07/31/17 1607  NA 135  K 3.9  CL 100*  CO2 24  GLUCOSE 129*  BUN 23*  CREATININE 0.83    CALCIUM 9.4  AST 31  ALT 28  ALKPHOS 59  BILITOT 0.9   ------------------------------------------------------------------------------------------------------------------ CrCl cannot be calculated (Unknown ideal weight.). ------------------------------------------------------------------------------------------------------------------ No results for input(s): TSH, T4TOTAL, T3FREE, THYROIDAB  in the last 72 hours.  Invalid input(s): FREET3  Coagulation profile No results for input(s): INR, PROTIME in the last 168 hours. ------------------------------------------------------------------------------------------------------------------- No results for input(s): DDIMER in the last 72 hours. -------------------------------------------------------------------------------------------------------------------  Cardiac Enzymes No results for input(s): CKMB, TROPONINI, MYOGLOBIN in the last 168 hours.  Invalid input(s): CK ------------------------------------------------------------------------------------------------------------------    Component Value Date/Time   BNP 188.4 (H) 05/05/2017 1325     ---------------------------------------------------------------------------------------------------------------  Urinalysis    Component Value Date/Time   COLORURINE YELLOW 05/20/2017 1233   APPEARANCEUR CLEAR 05/20/2017 1233   LABSPEC 1.015 05/20/2017 1233   PHURINE 7.0 05/20/2017 1233   GLUCOSEU NEGATIVE 05/20/2017 1233   HGBUR NEGATIVE 05/20/2017 1233   BILIRUBINUR NEGATIVE 05/20/2017 1233   KETONESUR NEGATIVE 05/20/2017 1233   PROTEINUR NEGATIVE 05/20/2017 1233   NITRITE NEGATIVE 05/20/2017 1233   LEUKOCYTESUR NEGATIVE 05/20/2017 1233    ----------------------------------------------------------------------------------------------------------------   Imaging Results:    Ct Head Wo Contrast  Result Date: 07/31/2017 CLINICAL DATA:  81 year old female status post fall with  headache. EXAM: CT HEAD WITHOUT CONTRAST TECHNIQUE: Contiguous axial images were obtained from the base of the skull through the vertex without intravenous contrast. COMPARISON:  Head CT 05/05/2017 and earlier. FINDINGS: Brain: Stable cerebral volume and ventriculomegaly. There is associated chronic mesial temporal lobe and anterior temporal tip volume loss. No midline shift, mass effect, evidence of mass lesion, intracranial hemorrhage or evidence of cortically based acute infarction. Gray-white matter differentiation is within normal limits throughout the brain. No focal encephalomalacia identified. Vascular: Calcified atherosclerosis at the skull base. No suspicious intracranial vascular hyperdensity. Skull: No skull fracture identified. No acute osseous abnormality identified. Sinuses/Orbits: Visualized paranasal sinuses and mastoids are stable and well pneumatized; chronic mild opacification of the inferior right mastoid air cells. Other: No scalp hematoma identified. Negative visible orbit soft tissues and deep soft tissue spaces of the face. IMPRESSION: 1. No acute intracranial abnormality. No acute traumatic injury identified. 2. Stable chronic ventriculomegaly, perhaps ex vacuo related. Electronically Signed   By: Genevie Ann M.D.   On: 07/31/2017 21:09   Ct Abdomen Pelvis W Contrast  Result Date: 07/31/2017 CLINICAL DATA:  Back pain. Known pelvic fracture after falling out of bed yesterday. EXAM: CT ABDOMEN AND PELVIS WITH CONTRAST TECHNIQUE: Multidetector CT imaging of the abdomen and pelvis was performed using the standard protocol following bolus administration of intravenous contrast. CONTRAST:  145mL ISOVUE-300 IOPAMIDOL (ISOVUE-300) INJECTION 61% COMPARISON:  10/08/2016 FINDINGS: Lower chest: Scarring in the lateral right base. No suspicious focal lung base lesion. No consolidation or effusion. Hepatobiliary: No focal liver abnormality is seen. No gallstones, gallbladder wall thickening, or biliary  dilatation. Pancreas: Mildly atrophic with associated ductal dilatation. No pancreatic parenchymal mass. No inflammation or fluid collection. Spleen: Normal in size without focal abnormality. Adrenals/Urinary Tract: Adrenal glands are unremarkable. Kidneys are normal, without renal calculi, focal lesion, or hydronephrosis. Bladder is unremarkable. Stomach/Bowel: Stomach, small bowel and colon are remarkable only for a generous volume of fluid throughout small bowel and a generous volume of colonic stool. No evidence of bowel obstruction. No focal inflammation of bowel. No extraluminal gas. Vascular/Lymphatic: The abdominal aorta is normal in caliber with extensive atherosclerotic calcification. No evidence of intra-abdominal vascular injury. No adenopathy in the abdomen or pelvis. Reproductive: Uterus and bilateral adnexa are unremarkable. Other: Mildly displaced mildly comminuted superior and inferior right pubic ramus fractures. Visible sacral fracture. No diastases of the sacroiliac joints or pubic symphysis. Small pelvic sidewall intramuscular hemorrhage, with mild thickening of the right obturator  internus. No drainable hematoma. Musculoskeletal: Multiple compressions of upper lumbar and lower thoracic vertebrae. The L1 compression appears to be acute. See accompanying lumbar spine CT for details. No significant focal bone lesions. IMPRESSION: 1. Acute fractures of the right pubic rami, mildly comminuted. No drainable hematoma. No SI or pubic symphysis diastases. 2. Acute L1 vertebral compression. Multiple chronic lumbar and lower thoracic compressions. 3. Pancreatic atrophy.  No pancreatic mass or inflammation. 4. Aortic atherosclerosis. 5. No intra-abdominal vascular injury. No parenchymal organ injury. No peritoneal blood or free air. 6. Irregular opacity in the lateral right lung base, likely scarring although not conclusively characterized. Attention to this area on follow-up is recommended to assure  stability. Electronically Signed   By: Andreas Newport M.D.   On: 07/31/2017 21:31   Ct L-spine No Charge  Result Date: 07/31/2017 CLINICAL DATA:  Trauma. Lumbosacral fracture. Fall out of bed today with lumbosacral back pain. EXAM: CT LUMBAR SPINE WITHOUT CONTRAST TECHNIQUE: Multidetector CT imaging of the lumbar spine was performed without intravenous contrast administration. Multiplanar CT image reconstructions were also generated. Imaging reformatted from abdominopelvic CT. COMPARISON:  Lumbar spine radiographs 10/08/2016. Abdominopelvic CT performed concurrently. FINDINGS: Segmentation: 5 lumbar type vertebrae. Alignment: Chronic grade 1 anterolisthesis of L4 on L5. Alignment is otherwise maintained. Vertebrae: Acute compression fracture with approximately 30% loss of height of superior endplate. Minimal extension of posterior cortex without significant retropulsion. No posterior element extension. L2 compression fracture is chronic, however progressed from prior radiograph with increased loss of height centrally and involving the inferior endplate. Minimal involvement of the posterior cortex without retropulsion. Mild compression fractures superior endplate of Q22 and L79 of unknown acuity, but new from 11/30 September 2016 radiograph. No posterior cortex involvement. No focal bone lesion. Paraspinal and other soft tissues: Perispinal musculature is intact without hematoma. Intra-abdominal structures better assessed on dedicated abdominal CT. Disc levels: Disc space narrowing and endplate spurring at G9-Q1 and L5-S1, similar in degree to prior exam. Facet arthropathy at L4-L5. No narrowing of the spinal canal. IMPRESSION: 1. Acute L1 compression fracture with approximately 30% loss of height. Minimal posterior cortex involvement without retropulsion. 2. Moderate L2 compression fracture is chronic, but has progressed from comparison radiograph of November 2017. Progressive loss of height centrally with new  inferior endplate component. 3. Mild T11 and T12 superior endplate compression fractures, new from prior radiograph but age indeterminate. Electronically Signed   By: Jeb Levering M.D.   On: 07/31/2017 21:45      Assessment & Plan:    Principal Problem:   Compression fracture of first lumbar vertebra (HCC) Active Problems:   Congenital hypothyroidism without goiter   Hyperlipidemia   Iron deficiency   Anemia   Hyperglycemia    Fall L1 compression fracture R pubic rami fracture Appreciate orthopedic input PT consult  Osteoporosis Bone density test as outpatient if none in the past 2 years Will need Forteo or other antiresorptive agent  RA STOP methotrexate Continue prednisone 7.5mg  po qday Will need to be tapered to at least 5mg  po qday or less if possible  Anemia Repeat cbc in am  Abnormal CT scan  Will need CT chest for evaluation of the irregular opacity in the right lateral lung base.  Check CXR for now  Hyperglycemia Check hga1c  + R carotid bruit Check carotid ultrasound  DVT Prophylaxis Lovenox  SCDs   AM Labs Ordered, also please review Full Orders  Family Communication: Admission, patients condition and plan of care including tests being ordered  have been discussed with the patient  who indicate understanding and agree with the plan and Code Status.  Code Status  DNR  Likely DC to  home  Condition GUARDED    Consults called:  Orthopedics by ED  Admission status: inpatient  Time spent in minutes : 45 minutes   Jani Gravel M.D on 08/01/2017 at 12:10 AM  Between 7am to 7pm - Pager - [phone]  . After 7pm go to [email or web address] - password [facility]  [facility] - Office  [phone]

## 2017-08-01 NOTE — Progress Notes (Signed)
*  PRELIMINARY RESULTS* Vascular Ultrasound Carotid Duplex (Doppler) has been completed.  Preliminary findings: Bilateral: No significant (1-39%) ICA stenosis. Antegrade vertebral flow.    Landry Mellow, RDMS, RVT  08/01/2017, 2:53 PM

## 2017-08-01 NOTE — Progress Notes (Signed)
Verbal order to place pt on diet Ordered tray for pt MD stated once pt pain is controlled, pt may be discharged tomorrow

## 2017-08-01 NOTE — Evaluation (Signed)
Physical Therapy Evaluation Patient Details Name: Charlene Duncan MRN: [id number] DOB: 1936-08-19 Today's Date: 08/01/2017   History of Present Illness  Charlene Duncan  is a 81 y.o. female, w dementia, new diagnosis of RA, hypothyroidism, on prednisone apparently fell at home. Imaging showed compression fx that seems to be new at L1, also has fxs L2, T11, and T12. Also showed minimally displaced pubic ramus fx. Ortho currently opting for non surgical mgmt with [facility].   Clinical Impression  Pt admitted with above diagnosis. Pt currently with functional limitations due to the deficits listed below (see PT Problem List). Pt presently very unsafe with mobility, has 8/10 back pain that is preventing ambulation and is requiring mod A for transfers for safety. Do not feel that she is safe at home alone at this point.  Pt will benefit from skilled PT to increase their independence and safety with mobility to allow discharge to the venue listed below.       Follow Up Recommendations SNF;Supervision/Assistance - 24 hour    Equipment Recommendations  Rolling walker with 5" wheels;3in1 (PT)    Recommendations for Other Services OT consult     Precautions / Restrictions Precautions Precautions: Fall Restrictions Weight Bearing Restrictions: No RLE Weight Bearing: Weight bearing as tolerated LLE Weight Bearing: Weight bearing as tolerated      Mobility  Bed Mobility               General bed mobility comments: pt received sitting EOB after transferring to [facility] with nsg. Pt's fall occurred when trying to get up from the bed at home  Transfers Overall transfer level: Needs assistance Equipment used: None;Rolling walker (2 wheeled) Transfers: Sit to/from [facility] Sit to Stand: Min assist Stand pivot transfers: Mod assist       General transfer comment: pt attempted standing to the RW but was unable due to pain. Then performed SPT to recliner from bed with no AD  and therapist in front of pt but pt not following commands and getting agitated that therapist was helping her and turned facing into chair before turning all the way around in a circle to sit down. Discussed this in relation to her fall risk  Ambulation/Gait             General Gait Details: unable due to pain  Stairs            Wheelchair Mobility    Modified Rankin (Stroke Patients Only)       Balance Overall balance assessment: Needs assistance;History of Falls Sitting-balance support: Feet supported Sitting balance-Leahy Scale: Fair     Standing balance support: Bilateral upper extremity supported Standing balance-Leahy Scale: Poor                               Pertinent Vitals/Pain Pain Assessment: Faces Faces Pain Scale: Hurts whole lot Pain Location: back Pain Descriptors / Indicators: Aching;Guarding;Grimacing Pain Intervention(s): Limited activity within patient's tolerance;Monitored during session;Premedicated before session;Repositioned    Home Living Family/patient expects to be discharged to:: Private residence Living Arrangements: Alone Available Help at Discharge: Family;Available PRN/intermittently;Personal care attendant Type of Home: House Home Access: Level entry     Home Layout: One level Home Equipment: Cane - single point Additional Comments: pt has an attendant 3 days/ wk for 4 hrs. Her son and daughter in law check on her and do the cooking    Prior Function Level of Independence: Independent  with assistive device(s)         Comments: uses cane     Hand Dominance        Extremity/Trunk Assessment   Upper Extremity Assessment Upper Extremity Assessment: Generalized weakness    Lower Extremity Assessment Lower Extremity Assessment: Generalized weakness    Cervical / Trunk Assessment Cervical / Trunk Assessment: Kyphotic  Communication   Communication: No difficulties  Cognition Arousal/Alertness:  Awake/alert Behavior During Therapy: Anxious;Agitated Overall Cognitive Status: History of cognitive impairments - at baseline                                 General Comments: pt is confused and has significant memory issues (forgets to eat, drink, etc) but she tries to hide it and gets agitated if anyone questions her about it      General Comments General comments (skin integrity, edema, etc.): pt has a dog at home and really wants to go home and be with her dog, not willing to speak of any other options. Spoke with son in hallway and he realizes that she currently needs 24/7 supervision and they cannot provide this at home    Exercises     Assessment/Plan    PT Assessment Patient needs continued PT services  PT Problem List Decreased strength;Decreased activity tolerance;Decreased balance;Decreased mobility;Decreased cognition;Decreased knowledge of use of DME;Decreased safety awareness;Decreased knowledge of precautions;Pain       PT Treatment Interventions DME instruction;Gait training;Functional mobility training;Therapeutic activities;Therapeutic exercise;Balance training;Cognitive remediation;Patient/family education    PT Goals (Current goals can be found in the Care Plan section)  Acute Rehab PT Goals Patient Stated Goal: go home PT Goal Formulation: With patient/family Time For Goal Achievement: 08/15/17 Potential to Achieve Goals: Fair    Frequency Min 3X/week   Barriers to discharge Decreased caregiver support      Co-evaluation               AM-PAC PT "6 Clicks" Daily Activity  Outcome Measure Difficulty turning over in bed (including adjusting bedclothes, sheets and blankets)?: Unable Difficulty moving from lying on back to sitting on the side of the bed? : Unable Difficulty sitting down on and standing up from a chair with arms (e.g., wheelchair, bedside commode, etc,.)?: Unable Help needed moving to and from a bed to chair (including a  wheelchair)?: A Little Help needed walking in hospital room?: A Little Help needed climbing 3-5 steps with a railing? : A Lot 6 Click Score: 11    End of Session   Activity Tolerance: Treatment limited secondary to agitation Patient left: in chair;with call bell/phone within reach;with chair alarm set;with family/visitor present Nurse Communication: Mobility status PT Visit Diagnosis: Unsteadiness on feet (R26.81);Repeated falls (R29.6);Muscle weakness (generalized) (M62.81);Difficulty in walking, not elsewhere classified (R26.2);Pain;Adult, failure to thrive (R62.7) Pain - Right/Left:  (back)    Time: 2505-3976 PT Time Calculation (min) (ACUTE ONLY): 39 min   Charges:   PT Evaluation $PT Eval Moderate Complexity: 1 Mod PT Treatments $Therapeutic Activity: 23-37 mins   PT G Codes:        Leighton Roach, PT  Acute Rehab Services  [place] 08/01/2017, 1:50 PM

## 2017-08-01 NOTE — Progress Notes (Signed)
Patient arrived from [facility] to room 3E27. VSS. SR on telemetry.  RA sats 96%. No skin break down noted. Patient alert to person, place and situation.  According to son patient gets more confused as night goes on. Patient placed on low bed with bed alarm activated. Patient advised to use call bell if in need of assistance.

## 2017-08-01 NOTE — Progress Notes (Addendum)
Paged MD regarding family at bedside requesting to speak to him   Completed pt admission database with family at bedside

## 2017-08-01 NOTE — ED Notes (Addendum)
Pt given a sprite to drink as requested. Pt also given a [place] sandwich, graham crackers and peanut butter

## 2017-08-01 NOTE — Progress Notes (Signed)
Provided pt with family phone numbers, pt anxious to talk to son. Pt states she needs her cane. Informed pt that we have a walker at bedside to use.

## 2017-08-01 NOTE — Progress Notes (Signed)
RN attempted to call pt family  Pt states she wants to go home to see her family  Reassured pt, pt resting in bed comfortably now

## 2017-08-02 ENCOUNTER — Inpatient Hospital Stay (HOSPITAL_COMMUNITY)
Admission: RE | Admit: 2017-08-02 | Discharge: 2017-08-10 | DRG: 560 | Disposition: A | Discharge status: Home Health | Payer: Medicare Other | Source: Intra-hospital | Attending: Physical Medicine & Rehabilitation | Admitting: Physical Medicine & Rehabilitation

## 2017-08-02 ENCOUNTER — Encounter (HOSPITAL_COMMUNITY): Payer: Self-pay | Admitting: *Deleted

## 2017-08-02 DIAGNOSIS — Z886 Allergy status to analgesic agent status: Secondary | ICD-10-CM | POA: Diagnosis not present

## 2017-08-02 DIAGNOSIS — I10 Essential (primary) hypertension: Secondary | ICD-10-CM | POA: Diagnosis present

## 2017-08-02 DIAGNOSIS — Z79899 Other long term (current) drug therapy: Secondary | ICD-10-CM

## 2017-08-02 DIAGNOSIS — Z9849 Cataract extraction status, unspecified eye: Secondary | ICD-10-CM

## 2017-08-02 DIAGNOSIS — S32018D Other fracture of first lumbar vertebra, subsequent encounter for fracture with routine healing: Secondary | ICD-10-CM

## 2017-08-02 DIAGNOSIS — S32010D Wedge compression fracture of first lumbar vertebra, subsequent encounter for fracture with routine healing: Secondary | ICD-10-CM | POA: Diagnosis not present

## 2017-08-02 DIAGNOSIS — Z9181 History of falling: Secondary | ICD-10-CM

## 2017-08-02 DIAGNOSIS — W19XXXD Unspecified fall, subsequent encounter: Secondary | ICD-10-CM | POA: Diagnosis present

## 2017-08-02 DIAGNOSIS — H8109 Meniere's disease, unspecified ear: Secondary | ICD-10-CM | POA: Diagnosis present

## 2017-08-02 DIAGNOSIS — G309 Alzheimer's disease, unspecified: Secondary | ICD-10-CM | POA: Diagnosis present

## 2017-08-02 DIAGNOSIS — E039 Hypothyroidism, unspecified: Secondary | ICD-10-CM | POA: Diagnosis present

## 2017-08-02 DIAGNOSIS — F0280 Dementia in other diseases classified elsewhere without behavioral disturbance: Secondary | ICD-10-CM | POA: Diagnosis present

## 2017-08-02 DIAGNOSIS — S32591D Other specified fracture of right pubis, subsequent encounter for fracture with routine healing: Secondary | ICD-10-CM | POA: Diagnosis not present

## 2017-08-02 DIAGNOSIS — S32010S Wedge compression fracture of first lumbar vertebra, sequela: Secondary | ICD-10-CM

## 2017-08-02 DIAGNOSIS — D62 Acute posthemorrhagic anemia: Secondary | ICD-10-CM | POA: Diagnosis present

## 2017-08-02 DIAGNOSIS — M1710 Unilateral primary osteoarthritis, unspecified knee: Secondary | ICD-10-CM | POA: Diagnosis present

## 2017-08-02 DIAGNOSIS — Z8261 Family history of arthritis: Secondary | ICD-10-CM

## 2017-08-02 DIAGNOSIS — Z7952 Long term (current) use of systemic steroids: Secondary | ICD-10-CM

## 2017-08-02 DIAGNOSIS — M4854XD Collapsed vertebra, not elsewhere classified, thoracic region, subsequent encounter for fracture with routine healing: Secondary | ICD-10-CM | POA: Diagnosis present

## 2017-08-02 DIAGNOSIS — S32010A Wedge compression fracture of first lumbar vertebra, initial encounter for closed fracture: Secondary | ICD-10-CM | POA: Diagnosis present

## 2017-08-02 DIAGNOSIS — Z853 Personal history of malignant neoplasm of breast: Secondary | ICD-10-CM | POA: Diagnosis not present

## 2017-08-02 DIAGNOSIS — S32010G Wedge compression fracture of first lumbar vertebra, subsequent encounter for fracture with delayed healing: Secondary | ICD-10-CM | POA: Diagnosis not present

## 2017-08-02 DIAGNOSIS — R0789 Other chest pain: Secondary | ICD-10-CM | POA: Diagnosis not present

## 2017-08-02 DIAGNOSIS — S32591S Other specified fracture of right pubis, sequela: Secondary | ICD-10-CM

## 2017-08-02 DIAGNOSIS — S32000A Wedge compression fracture of unspecified lumbar vertebra, initial encounter for closed fracture: Secondary | ICD-10-CM | POA: Insufficient documentation

## 2017-08-02 DIAGNOSIS — M069 Rheumatoid arthritis, unspecified: Secondary | ICD-10-CM | POA: Diagnosis present

## 2017-08-02 HISTORY — DX: Edema, unspecified: R60.9

## 2017-08-02 HISTORY — DX: Repeated falls: R29.6

## 2017-08-02 HISTORY — DX: Essential (primary) hypertension: I10

## 2017-08-02 HISTORY — DX: Localized edema: R60.0

## 2017-08-02 HISTORY — DX: Rheumatoid arthritis, unspecified: M06.9

## 2017-08-02 LAB — VAS US CAROTID
LCCADDIAS: 17 cm/s
LCCADSYS: 72 cm/s
LCCAPDIAS: 18 cm/s
LCCAPSYS: 111 cm/s
LEFT ECA DIAS: -3 cm/s
LEFT VERTEBRAL DIAS: 9 cm/s
LICADSYS: -109 cm/s
Left ICA dist dias: -29 cm/s
Left ICA prox dias: -17 cm/s
Left ICA prox sys: -71 cm/s
RCCADSYS: -74 cm/s
RIGHT ECA DIAS: -10 cm/s
RIGHT VERTEBRAL DIAS: 7 cm/s
Right CCA prox dias: -16 cm/s
Right CCA prox sys: -78 cm/s

## 2017-08-02 MED ORDER — DIPHENHYDRAMINE HCL 12.5 MG/5ML PO ELIX: 12.5000 mg | ORAL_SOLUTION | Freq: Four times a day (QID) | ORAL | Status: DC | PRN

## 2017-08-02 MED ORDER — PREDNISONE 10 MG PO TABS
5.0000 mg | ORAL_TABLET | Freq: Every day | ORAL | Status: DC
  Administered 2017-08-08 – 2017-08-10 (×3): 5 mg via ORAL
  Filled 2017-08-02 (×3): qty 1

## 2017-08-02 MED ORDER — DULOXETINE HCL 30 MG PO CPEP
30.0000 mg | ORAL_CAPSULE | Freq: Every day | ORAL | Status: DC
  Administered 2017-08-03 – 2017-08-10 (×8): 30 mg via ORAL
  Filled 2017-08-02 (×8): qty 1

## 2017-08-02 MED ORDER — PROCHLORPERAZINE MALEATE 5 MG PO TABS: 5.0000 mg | ORAL_TABLET | Freq: Four times a day (QID) | ORAL | Status: DC | PRN

## 2017-08-02 MED ORDER — TRAMADOL HCL 50 MG PO TABS
50.0000 mg | ORAL_TABLET | Freq: Four times a day (QID) | ORAL | Status: DC | PRN
  Administered 2017-08-02 – 2017-08-10 (×21): 50 mg via ORAL
  Filled 2017-08-02 (×22): qty 1

## 2017-08-02 MED ORDER — ENSURE ENLIVE PO LIQD
237.0000 mL | Freq: Two times a day (BID) | ORAL | Status: DC
  Administered 2017-08-03 – 2017-08-09 (×8): 237 mL via ORAL

## 2017-08-02 MED ORDER — LEVOTHYROXINE SODIUM 88 MCG PO TABS
88.0000 ug | ORAL_TABLET | ORAL | Status: DC
  Administered 2017-08-03 – 2017-08-09 (×4): 88 ug via ORAL
  Filled 2017-08-02 (×4): qty 1

## 2017-08-02 MED ORDER — METHOTREXATE 2.5 MG PO TABS
10.0000 mg | ORAL_TABLET | ORAL | Status: DC
  Administered 2017-08-04: 10 mg via ORAL
  Filled 2017-08-02: qty 4

## 2017-08-02 MED ORDER — PREDNISONE 5 MG PO TABS
7.5000 mg | ORAL_TABLET | Freq: Every day | ORAL | Status: DC
  Filled 2017-08-02: qty 1

## 2017-08-02 MED ORDER — VITAMIN B-12 1000 MCG PO TABS
500.0000 ug | ORAL_TABLET | Freq: Every day | ORAL | Status: DC
  Administered 2017-08-03 – 2017-08-10 (×8): 500 ug via ORAL
  Filled 2017-08-02 (×4): qty 1
  Filled 2017-08-02: qty 0.5
  Filled 2017-08-02 (×3): qty 1

## 2017-08-02 MED ORDER — VITAMIN B-12 500 MCG PO TABS
500.0000 ug | ORAL_TABLET | Freq: Every day | ORAL | Status: DC
Start: 2017-08-02 — End: 2017-08-02

## 2017-08-02 MED ORDER — PROCHLORPERAZINE EDISYLATE 5 MG/ML IJ SOLN: 5.0000 mg | Freq: Four times a day (QID) | INTRAMUSCULAR | Status: DC | PRN

## 2017-08-02 MED ORDER — ACETAMINOPHEN 325 MG PO TABS
325.0000 mg | ORAL_TABLET | ORAL | Status: DC | PRN
  Administered 2017-08-02 – 2017-08-03 (×2): 650 mg via ORAL
  Administered 2017-08-03: 325 mg via ORAL
  Administered 2017-08-04 – 2017-08-10 (×9): 650 mg via ORAL
  Filled 2017-08-02 (×8): qty 2
  Filled 2017-08-02: qty 1
  Filled 2017-08-02 (×4): qty 2

## 2017-08-02 MED ORDER — METHOCARBAMOL 500 MG PO TABS
500.0000 mg | ORAL_TABLET | Freq: Four times a day (QID) | ORAL | Status: DC | PRN
  Administered 2017-08-03 (×2): 500 mg via ORAL
  Filled 2017-08-02 (×2): qty 1

## 2017-08-02 MED ORDER — ALUM & MAG HYDROXIDE-SIMETH 200-200-20 MG/5ML PO SUSP
30.0000 mL | ORAL | Status: DC | PRN
  Administered 2017-08-02 – 2017-08-07 (×3): 30 mL via ORAL
  Filled 2017-08-02 (×3): qty 30

## 2017-08-02 MED ORDER — GUAIFENESIN-DM 100-10 MG/5ML PO SYRP: 5.0000 mL | ORAL_SOLUTION | Freq: Four times a day (QID) | ORAL | Status: DC | PRN

## 2017-08-02 MED ORDER — PREDNISONE 20 MG PO TABS
75.0000 mg | ORAL_TABLET | Freq: Every day | ORAL | Status: AC
  Administered 2017-08-03 – 2017-08-07 (×5): 75 mg via ORAL
  Filled 2017-08-02 (×5): qty 3

## 2017-08-02 MED ORDER — PROCHLORPERAZINE 25 MG RE SUPP: 12.5000 mg | Freq: Four times a day (QID) | RECTAL | Status: DC | PRN

## 2017-08-02 MED ORDER — TRAZODONE HCL 50 MG PO TABS
25.0000 mg | ORAL_TABLET | Freq: Every evening | ORAL | Status: DC | PRN
  Administered 2017-08-03 – 2017-08-07 (×2): 50 mg via ORAL
  Filled 2017-08-02 (×3): qty 1

## 2017-08-02 MED ORDER — BISACODYL 10 MG RE SUPP: 10.0000 mg | Freq: Every day | RECTAL | Status: DC | PRN

## 2017-08-02 MED ORDER — ENOXAPARIN SODIUM 40 MG/0.4ML ~~LOC~~ SOLN
40.0000 mg | SUBCUTANEOUS | Status: DC
  Administered 2017-08-03 – 2017-08-09 (×7): 40 mg via SUBCUTANEOUS
  Filled 2017-08-02 (×7): qty 0.4

## 2017-08-02 MED ORDER — FLEET ENEMA 7-19 GM/118ML RE ENEM: 1.0000 | ENEMA | Freq: Once | RECTAL | Status: DC | PRN

## 2017-08-02 MED ORDER — TRAMADOL HCL 50 MG PO TABS: 50.0000 mg | ORAL_TABLET | Freq: Four times a day (QID) | ORAL | 0 refills | Status: DC | PRN

## 2017-08-02 MED ORDER — ACETAMINOPHEN 325 MG PO TABS: 650.0000 mg | ORAL_TABLET | Freq: Four times a day (QID) | ORAL | Status: AC | PRN

## 2017-08-02 MED ORDER — FUSION PLUS PO CAPS: 1.0000 | ORAL_CAPSULE | Freq: Every day | ORAL | Status: DC

## 2017-08-02 MED ORDER — POLYETHYLENE GLYCOL 3350 17 G PO PACK
17.0000 g | PACK | Freq: Every day | ORAL | Status: DC | PRN
  Administered 2017-08-04 – 2017-08-09 (×4): 17 g via ORAL
  Filled 2017-08-02 (×4): qty 1

## 2017-08-02 NOTE — Progress Notes (Signed)
Pt admitted to unit in 4M08 . Pt has family at bedside and is in no acute distress. Continue plan of care.

## 2017-08-02 NOTE — Clinical Social Work Note (Signed)
Clinical Social Work Assessment  Patient Details  Name: Charlene Duncan MRN: [id number] Date of Birth: 02-03-1936  Date of referral:  08/02/17               Reason for consult:  Facility Placement, Discharge Planning                Permission sought to share information with:  Facility Sport and exercise psychologist, Family Supports Permission granted to share information::  Yes, Verbal Permission Granted  Name::     Nao Linz  Agency::  SNF's  Relationship::  Son  Contact Information:  [phone]  Housing/Transportation Living arrangements for the past 2 months:  Single Family Home Source of Information:  Patient, Medical Team, Adult Children Patient Interpreter Needed:  None Criminal Activity/Legal Involvement Pertinent to Current Situation/Hospitalization:  No - Comment as needed Significant Relationships:  Adult Children Lives with:  Self Do you feel safe going back to the place where you live?  Yes Need for family participation in patient care:  Yes (Comment)  Care giving concerns:  PT recommending CIR when medically stable for discharge.   Social Worker assessment / plan:  CSW met with patient. Son at bedside. CSW introduced role and explained that PT recommendations would be discussed. Patient and her son were very happy to hear that PT had upgraded their recommendations from SNF to CIR. Discussed possible barriers to CIR and need for SNF back up plan. Patient and her son are agreeable to this. Patient's PASARR is under manual review. Patient cannot discharge to SNF until PASARR obtained. No further concerns. CSW encouraged patient and her son to contact CSW as needed. CSW will continue to follow patient and her son for support and facilitate discharge to SNF, if needed, once medically stable.  Employment status:  Retired Forensic scientist:  Medicare PT Recommendations:  Inpatient [place] / Referral to community resources:  [place]  Patient/Family's Response to care:  Patient and her son agreeable to SNF backup plan. Patient's son and daughter-in-law supportive and involved in patient's care. Patient and her son appreciated social work intervention.  Patient/Family's Understanding of and Emotional Response to Diagnosis, Current Treatment, and Prognosis:  Patient oriented to self and place only. Patient's son has a good understanding of the reason for admission and her need for rehab prior to returning home. Patient and her son appear happy with hospital care.  Emotional Assessment Appearance:  Appears stated age Attitude/Demeanor/Rapport:  Other (Pleasant) Affect (typically observed):  Accepting, Appropriate, Calm, Pleasant Orientation:  Oriented to Self, Oriented to Place Alcohol / Substance use:  Never Used Psych involvement (Current and /or in the community):  No (Comment)  Discharge Needs  Concerns to be addressed:  Care Coordination Readmission within the last 30 days:  No Current discharge risk:  Cognitively Impaired, Dependent with Mobility, Lives alone Barriers to Discharge:  Continued Medical Work up, Programmer, applications ([place])   Candie Chroman, LCSW 08/02/2017, 3:38 PM

## 2017-08-02 NOTE — H&P (Signed)
Physical Medicine and Rehabilitation Admission H&P    Chief Complaint  Patient presents with  . pelvic fx  : HPI:  Charlene Duncan is a 81 y.o. female with dementia, RA who lives with intermittent assistance at the home, who fell at home on  07/31/17 suffering acute fractures of the right puc rami as well as an L1 compression fracture. Imaging was also notable for multiple chronic lumbar and thoracic compression fractures. Pt has struggled with mobility due to pain and lower extremity weakness. PM&R was consulted to assess for rehab needs and felt that the patient would benefit from an inpatient rehab admission. Pt is admitted today to maximize functional gains  Review of Systems  Constitutional: Negative for chills and fever.  HENT: Negative for hearing loss and tinnitus.   Eyes: Negative for blurred vision and photophobia.  Respiratory: Negative for cough and hemoptysis.   Cardiovascular: Negative for chest pain and palpitations.  Gastrointestinal: Negative for abdominal pain, nausea and vomiting.  Genitourinary: Negative for dysuria and urgency.  Musculoskeletal: Positive for back pain, falls, joint pain and myalgias.  Skin: Negative for itching and rash.  Neurological: Negative for dizziness, loss of consciousness and headaches.  Psychiatric/Behavioral: Positive for memory loss. Negative for depression.   Past Medical History:  Diagnosis Date  . Alzheimer disease 05/16/2017  . Anemia   . Breast CA ([place])   . Cancer (HCC)    breast  . High cholesterol   . Iron deficiency   . Meniere disease   . Myalgia   . OA (osteoarthritis) of knee   . Thyroid disease    hypothyroid  . Vitamin B 12 deficiency    Past Surgical History:  Procedure Laterality Date  . BREAST LUMPECTOMY    . CATARACT EXTRACTION     Family History  Problem Relation Age of Onset  . Arthritis Mother   . Diabetes Mother   . Cirrhosis Sister   . Cirrhosis Brother    Social History:  reports that she  has never smoked. She has never used smokeless tobacco. She reports that she does not drink alcohol or use drugs. Allergies:  Allergies  Allergen Reactions  . Naproxen Hives   Medications Prior to Admission  Medication Sig Dispense Refill  . acetaZOLAMIDE (DIAMOX) 250 MG tablet Take 250 mg by mouth as needed (Meniere's).     . celecoxib (CELEBREX) 200 MG capsule Take 200 mg by mouth daily as needed for mild pain.     . Cholecalciferol (VITAMIN D PO) Take 1 tablet by mouth daily.    . DULoxetine (CYMBALTA) 30 MG capsule Take 30 mg by mouth daily.     . Iron-FA-B Cmp-C-Biot-Probiotic (FUSION PLUS PO) Take 1 capsule by mouth daily.    [name] levothyroxine (SYNTHROID, LEVOTHROID) 88 MCG tablet Take 88 mcg by mouth every other day.     . methotrexate 2.5 MG tablet Take 10 mg by mouth once a week. THURSDAYS    . predniSONE (DELTASONE) 10 MG tablet Take 5 mg by mouth daily.     . vitamin B-12 (CYANOCOBALAMIN) 500 MCG tablet Take 500 mcg by mouth daily.    [name] donepezil (ARICEPT) 5 MG tablet TAKE 1 TABLET BY MOUTH AT BEDTIME (Patient not taking: Reported on 07/31/2017) 30 tablet 2     Drug regimen was reviewed and remains appropriate with no significant issues identified  Home: Home Living Family/patient expects to be discharged to:: Private residence Living Arrangements: Alone Available Help at Discharge: Family, Available PRN/intermittently,  Personal care attendant Type of Home: House Home Access: Level entry Home Layout: One level Home Equipment: Cane - single point Additional Comments: pt has an attendant 3 days/ wk for 4 hrs. Her son and daughter in law check on her and do the cooking   Functional History: Prior Function Level of Independence: Independent with assistive device(s) Comments: uses cane  Functional Status:  Mobility: Bed Mobility General bed mobility comments: pt in recliner on arrival Transfers Overall transfer level: Needs assistance Equipment used: Rolling walker (2  wheeled) Transfers: Sit to/from Stand Sit to Stand: Min assist Stand pivot transfers: Min assist General transfer comment: pt transfers well, requires min assist for forward weight shift and min verbal cues for hand placement when transferring sit<>stand Ambulation/Gait Ambulation/Gait assistance: Min assist Ambulation Distance (Feet): 50 Feet Assistive device: Rolling walker (2 wheeled) Gait Pattern/deviations: Step-to pattern, Antalgic, Trunk flexed General Gait Details: min verbal cues for walker positioning and use of UEs to reduce weightbearing in RLE 2/2 pain Gait velocity: decreased Gait velocity interpretation: Below normal speed for age/gender    ADL:    Cognition: Cognition Overall Cognitive Status: History of cognitive impairments - at baseline Orientation Level: Oriented to person, Oriented to place, Disoriented to situation Cognition Arousal/Alertness: Awake/alert Behavior During Therapy: Anxious Overall Cognitive Status: History of cognitive impairments - at baseline General Comments: Pt with history of dementia, gets upset if confronted with her memory loss, but is very pleasant overall and easily redirected  Physical Exam: Blood pressure (!) 133/97, pulse 86, temperature 98.3 F (36.8 C), temperature source Oral, resp. rate 16, height 5' (1.524 m), weight 49 kg (108 lb), SpO2 97 %. Physical Exam  Constitutional: She appears well-developed. No distress.  HENT:  Head: Normocephalic and atraumatic.  Eyes: Pupils are equal, round, and reactive to light. Conjunctivae are normal.  Neck: No tracheal deviation present. No thyromegaly present.  Cardiovascular: Normal rate.  Exam reveals no friction rub.   No murmur heard. Respiratory: No respiratory distress. She has no wheezes. She has no rales.  GI: Soft. She exhibits no distension. There is no tenderness.  Musculoskeletal: She exhibits no edema or tenderness.  Tenderness along low back with flexion and general  palpation. Right hip tender with basic attempts at HF,rotation.   Skin: Skin is warm.  A few bruises along right pelvis  Psychiatric: She has a normal mood and affect.  Occasionally anxious    Results for orders placed or performed during the hospital encounter of 07/31/17 (from the past 48 hour(s))  Comprehensive metabolic panel     Status: Abnormal   Collection Time: 08/01/17  4:09 AM  Result Value Ref Range   Sodium 137 135 - 145 mmol/L   Potassium 3.6 3.5 - 5.1 mmol/L   Chloride 102 101 - 111 mmol/L   CO2 28 22 - 32 mmol/L   Glucose, Bld 95 65 - 99 mg/dL   BUN 21 (H) 6 - 20 mg/dL   Creatinine, Ser 0.83 0.44 - 1.00 mg/dL   Calcium 8.9 8.9 - 10.3 mg/dL   Total Protein 6.0 (L) 6.5 - 8.1 g/dL   Albumin 3.1 (L) 3.5 - 5.0 g/dL   AST 26 15 - 41 U/L   ALT 23 14 - 54 U/L   Alkaline Phosphatase 50 38 - 126 U/L   Total Bilirubin 0.7 0.3 - 1.2 mg/dL   GFR calc non Af Amer >60 >60 mL/min   GFR calc Af Amer >60 >60 mL/min    Comment: (NOTE) The  eGFR has been calculated using the CKD EPI equation. This calculation has not been validated in all clinical situations. eGFR's persistently <60 mL/min signify possible Chronic Kidney Disease.    Anion gap 7 5 - 15  CBC     Status: Abnormal   Collection Time: 08/01/17  4:09 AM  Result Value Ref Range   WBC 9.1 4.0 - 10.5 K/uL   RBC 3.20 (L) 3.87 - 5.11 MIL/uL   Hemoglobin 10.1 (L) 12.0 - 15.0 g/dL   HCT 31.8 (L) 36.0 - 46.0 %   MCV 99.4 78.0 - 100.0 fL   MCH 31.6 26.0 - 34.0 pg   MCHC 31.8 30.0 - 36.0 g/dL   RDW 15.7 (H) 11.5 - 15.5 %   Platelets 262 150 - 400 K/uL  Hemoglobin A1c     Status: Abnormal   Collection Time: 08/01/17  4:09 AM  Result Value Ref Range   Hgb A1c MFr Bld 5.8 (H) 4.8 - 5.6 %    Comment: (NOTE) Pre diabetes:          5.7%-6.4% Diabetes:              >6.4% Glycemic control for   <7.0% adults with diabetes    Mean Plasma Glucose 119.76 mg/dL   Dg Chest 2 View  Result Date: 08/01/2017 CLINICAL DATA:   Evaluate right basilar lung opacity identified on CT abdomen and pelvis yesterday. Personal history of right breast cancer. EXAM: CHEST  2 VIEW COMPARISON:  Images of the lung bases from CT abdomen and pelvis yesterday. CT chest 01/15/2016. FINDINGS: AP erect and lateral images were obtained. Cardiac silhouette upper normal in size for AP technique. Thoracic aorta atherosclerotic. Hilar and mediastinal contours otherwise unremarkable. Numerous likely remote right rib fractures with nonunion. Pleuroparenchymal scarring involving the lateral right lung and pleural space, accounting for the opacity on CT. Mildly prominent bronchovascular markings diffusely. Lungs otherwise clear. No pleural effusions. Generalized osseous demineralization. IMPRESSION: 1. Lateral pleuroparenchymal scarring on the right, accounting for the opacity identified on in the CT yesterday. 2.  No acute cardiopulmonary disease. Electronically Signed   By: Evangeline Dakin M.D.   On: 08/01/2017 08:36   Ct Head Wo Contrast  Result Date: 07/31/2017 CLINICAL DATA:  81 year old female status post fall with headache. EXAM: CT HEAD WITHOUT CONTRAST TECHNIQUE: Contiguous axial images were obtained from the base of the skull through the vertex without intravenous contrast. COMPARISON:  Head CT 05/05/2017 and earlier. FINDINGS: Brain: Stable cerebral volume and ventriculomegaly. There is associated chronic mesial temporal lobe and anterior temporal tip volume loss. No midline shift, mass effect, evidence of mass lesion, intracranial hemorrhage or evidence of cortically based acute infarction. Gray-white matter differentiation is within normal limits throughout the brain. No focal encephalomalacia identified. Vascular: Calcified atherosclerosis at the skull base. No suspicious intracranial vascular hyperdensity. Skull: No skull fracture identified. No acute osseous abnormality identified. Sinuses/Orbits: Visualized paranasal sinuses and mastoids are  stable and well pneumatized; chronic mild opacification of the inferior right mastoid air cells. Other: No scalp hematoma identified. Negative visible orbit soft tissues and deep soft tissue spaces of the face. IMPRESSION: 1. No acute intracranial abnormality. No acute traumatic injury identified. 2. Stable chronic ventriculomegaly, perhaps ex vacuo related. Electronically Signed   By: Genevie Ann M.D.   On: 07/31/2017 21:09   Ct Abdomen Pelvis W Contrast  Result Date: 07/31/2017 CLINICAL DATA:  Back pain. Known pelvic fracture after falling out of bed yesterday. EXAM: CT ABDOMEN AND PELVIS WITH  CONTRAST TECHNIQUE: Multidetector CT imaging of the abdomen and pelvis was performed using the standard protocol following bolus administration of intravenous contrast. CONTRAST:  140m ISOVUE-300 IOPAMIDOL (ISOVUE-300) INJECTION 61% COMPARISON:  10/08/2016 FINDINGS: Lower chest: Scarring in the lateral right base. No suspicious focal lung base lesion. No consolidation or effusion. Hepatobiliary: No focal liver abnormality is seen. No gallstones, gallbladder wall thickening, or biliary dilatation. Pancreas: Mildly atrophic with associated ductal dilatation. No pancreatic parenchymal mass. No inflammation or fluid collection. Spleen: Normal in size without focal abnormality. Adrenals/Urinary Tract: Adrenal glands are unremarkable. Kidneys are normal, without renal calculi, focal lesion, or hydronephrosis. Bladder is unremarkable. Stomach/Bowel: Stomach, small bowel and colon are remarkable only for a generous volume of fluid throughout small bowel and a generous volume of colonic stool. No evidence of bowel obstruction. No focal inflammation of bowel. No extraluminal gas. Vascular/Lymphatic: The abdominal aorta is normal in caliber with extensive atherosclerotic calcification. No evidence of intra-abdominal vascular injury. No adenopathy in the abdomen or pelvis. Reproductive: Uterus and bilateral adnexa are unremarkable.  Other: Mildly displaced mildly comminuted superior and inferior right pubic ramus fractures. Visible sacral fracture. No diastases of the sacroiliac joints or pubic symphysis. Small pelvic sidewall intramuscular hemorrhage, with mild thickening of the right obturator internus. No drainable hematoma. Musculoskeletal: Multiple compressions of upper lumbar and lower thoracic vertebrae. The L1 compression appears to be acute. See accompanying lumbar spine CT for details. No significant focal bone lesions. IMPRESSION: 1. Acute fractures of the right pubic rami, mildly comminuted. No drainable hematoma. No SI or pubic symphysis diastases. 2. Acute L1 vertebral compression. Multiple chronic lumbar and lower thoracic compressions. 3. Pancreatic atrophy.  No pancreatic mass or inflammation. 4. Aortic atherosclerosis. 5. No intra-abdominal vascular injury. No parenchymal organ injury. No peritoneal blood or free air. 6. Irregular opacity in the lateral right lung base, likely scarring although not conclusively characterized. Attention to this area on follow-up is recommended to assure stability. Electronically Signed   By: DAndreas NewportM.D.   On: 07/31/2017 21:31   Ct L-spine No Charge  Result Date: 07/31/2017 CLINICAL DATA:  Trauma. Lumbosacral fracture. Fall out of bed today with lumbosacral back pain. EXAM: CT LUMBAR SPINE WITHOUT CONTRAST TECHNIQUE: Multidetector CT imaging of the lumbar spine was performed without intravenous contrast administration. Multiplanar CT image reconstructions were also generated. Imaging reformatted from abdominopelvic CT. COMPARISON:  Lumbar spine radiographs 10/08/2016. Abdominopelvic CT performed concurrently. FINDINGS: Segmentation: 5 lumbar type vertebrae. Alignment: Chronic grade 1 anterolisthesis of L4 on L5. Alignment is otherwise maintained. Vertebrae: Acute compression fracture with approximately 30% loss of height of superior endplate. Minimal extension of posterior cortex  without significant retropulsion. No posterior element extension. L2 compression fracture is chronic, however progressed from prior radiograph with increased loss of height centrally and involving the inferior endplate. Minimal involvement of the posterior cortex without retropulsion. Mild compression fractures superior endplate of TK74and TQ59of unknown acuity, but new from 11/30 September 2016 radiograph. No posterior cortex involvement. No focal bone lesion. Paraspinal and other soft tissues: Perispinal musculature is intact without hematoma. Intra-abdominal structures better assessed on dedicated abdominal CT. Disc levels: Disc space narrowing and endplate spurring at LD6-L8and L5-S1, similar in degree to prior exam. Facet arthropathy at L4-L5. No narrowing of the spinal canal. IMPRESSION: 1. Acute L1 compression fracture with approximately 30% loss of height. Minimal posterior cortex involvement without retropulsion. 2. Moderate L2 compression fracture is chronic, but has progressed from comparison radiograph of November 2017. Progressive loss of  height centrally with new inferior endplate component. 3. Mild T11 and T12 superior endplate compression fractures, new from prior radiograph but age indeterminate. Electronically Signed   By: Jeb Levering M.D.   On: 07/31/2017 21:45       Medical Problem List and Plan: 1.  Functional deficits and gait disorder secondary to right pubic rami fracture, L1 compression fracture  -admit to inpatient rehab 2.  DVT Prophylaxis/Anticoagulation: Pharmaceutical: Lovenox 15m sq daily 3. Pain Management: tylenol and celebrex prn.   -tramadol for more severe pain 4. Mood: cymbalta per home regimen. Mood seems up beat  -she's anxious to return home 5. Neuropsych: This patient is capable of making decisions on her own behalf.  -pt has not been using aricept at home 6. Skin/Wound Care: local care, optimize nutrition 7. Fluids/Electrolytes/Nutrition: encourage  PO  -protein supps  -check admission labs 8. RA: continue prednisone per baseline 7.557mdaily  -MTX 1078meekly on Thursdays 9. Hypothyroid: synthroid 79m76maily      Post Admission Physician Evaluation: 1. Functional deficits secondary  to pelvic and lumbar fractures. 2. Patient is admitted to receive collaborative, interdisciplinary care between the physiatrist, rehab nursing staff, and therapy team. 3. Patient's level of medical complexity and substantial therapy needs in context of that medical necessity cannot be provided at a lesser intensity of care such as a SNF. 4. Patient has experienced substantial functional loss from his/her baseline which was documented above under the "Functional History" and "Functional Status" headings.  Judging by the patient's diagnosis, physical exam, and functional history, the patient has potential for functional progress which will result in measurable gains while on inpatient rehab.  These gains will be of substantial and practical use upon discharge  in facilitating mobility and self-care at the household level. 5. Physiatrist will provide 24 hour management of medical needs as well as oversight of the therapy plan/treatment and provide guidance as appropriate regarding the interaction of the two. 6. The Preadmission Screening has been reviewed and patient status is unchanged unless otherwise stated above. 7. 24 hour rehab nursing will assist with bladder management, bowel management, safety, skin/wound care, disease management, medication administration, pain management and patient education  and help integrate therapy concepts, techniques,education, etc. 8. PT will assess and treat for/with: Lower extremity strength, range of motion, stamina, balance, functional mobility, safety, adaptive techniques and equipment, pain mgt, family education.   Goals are: mod I to supervision. 9. OT will assess and treat for/with: ADL's, functional mobility, safety,  upper extremity strength, adaptive techniques and equipment, pain mgt, family ed, ego support.   Goals are: mod I to supervision. Therapy may proceed with showering this patient. 10. SLP will assess and treat for/with: n/a.  Goals are: n/a. 11. Case Management and Social Worker will assess and treat for psychological issues and discharge planning. 12. Team conference will be held weekly to assess progress toward goals and to determine barriers to discharge. 13. Patient will receive at least 3 hours of therapy per day at least 5 days per week. 14. ELOS: 7-9 days       15. Prognosis:  excellent     ZachMeredith Staggers, FAAPBurlingtonsical Medicine & Rehabilitation 08/02/2017  SWARMeredith Staggers 08/02/2017

## 2017-08-02 NOTE — Progress Notes (Signed)
RN from rehab came to inform that pt was accepted and will be moved today. RN stated nurse from [facility] will call for report

## 2017-08-02 NOTE — Progress Notes (Signed)
Charlene Gong, RN Rehab Admission Coordinator Signed Physical Medicine and Rehabilitation  PMR Pre-admission Date of Service: 08/02/2017 4:26 PM  Related encounter: ED to Hosp-Admission (Current) from 07/31/2017 in [facility]       [] Hide copied text PMR Admission Coordinator Pre-Admission Assessment  Patient: Charlene Duncan is an 81 y.o., female MRN: [id number] DOB: 01-15-1936 Height: 5' (152.4 cm) Weight: 49 kg (108 lb)                                                                                                                                                  Insurance Information HMO:     PPO:      PCP:      IPA:      80/20: yes     OTHER: no HMO PRIMARY: Medicare a and b      Policy#: [id number]      Subscriber: pt Benefits:  Phone #: online     Name: 08/02/2017 Eff. Date: 12/30/2002     Deduct: $1340      Out of Pocket Max: none      Life Max: none CIR: 100%      SNF: 20 full days Outpatient: 80%     Co-Pay: 20% Home Health: 100%      Co-Pay: none DME: 80%     Co-Pay: 20% Providers: pt choice  SECONDARY: [employer]      Policy#: [id number]      Subscriber: pt  Medicaid Application Date:       Case Manager:  Disability Application Date:       Case Worker:   Emergency Contact Information        Contact Information    Name Relation Home Work Mobile   [place] Son [phone]     Pehl,Theresa Relative [phone]       Current Medical History  Patient Admitting Diagnosis: right pubic rami and L1compression fracture after fall  History of Present Illness: HPI: Charlene Duncan a 81 y.o.femalewith dementia, RA who lives with intermittent assistance at the home, who fell at home on 07/31/17 suffering acute fractures of the right puc rami as well as an L1 compression fracture. Imaging was also notable for multiple chronic lumbar and thoracic compression fractures. Pt has struggled with mobility due to pain and lower extremity  weakness.   Past Medical History      Past Medical History:  Diagnosis Date  . Alzheimer disease 05/16/2017  . Anemia   . Breast CA ([place])   . Cancer (HCC)    breast  . High cholesterol   . Iron deficiency   . Meniere disease   . Myalgia   . OA (osteoarthritis) of knee   . Thyroid disease    hypothyroid  . Vitamin B 12 deficiency     Family History  family history  includes Arthritis in her mother; Cirrhosis in her brother and sister; Diabetes in her mother.  Prior Rehab/Hospitalizations:  Has the patient had major surgery during 100 days prior to admission? No  Current Medications   Current Facility-Administered Medications:  .  acetaminophen (TYLENOL) tablet 650 mg, 650 mg, Oral, Q6H PRN, 650 mg at 08/02/17 1408 **OR** acetaminophen (TYLENOL) suppository 650 mg, 650 mg, Rectal, Q6H PRN, Jani Gravel, MD .  celecoxib (CELEBREX) capsule 200 mg, 200 mg, Oral, Daily PRN, Jani Gravel, MD .  DULoxetine (CYMBALTA) DR capsule 30 mg, 30 mg, Oral, Daily, Jani Gravel, MD, 30 mg at 08/02/17 0850 .  enoxaparin (LOVENOX) injection 30 mg, 30 mg, Subcutaneous, Q24H, Jani Gravel, MD, 30 mg at 08/02/17 0850 .  levothyroxine (SYNTHROID, LEVOTHROID) tablet 88 mcg, 88 mcg, Oral, Oretha Milch, MD, 88 mcg at 08/01/17 0630 .  predniSONE (DELTASONE) tablet 7.5 mg, 7.5 mg, Oral, Q breakfast, Jani Gravel, MD, 7.5 mg at 08/02/17 0849 .  traMADol (ULTRAM) tablet 50 mg, 50 mg, Oral, Q6H PRN, [name], MD, 50 mg at 08/02/17 0849 .  vitamin B-12 (CYANOCOBALAMIN) tablet 500 mcg, 500 mcg, Oral, Daily, Jani Gravel, MD, 500 mcg at 08/02/17 6063  Patients Current Diet: Diet Heart Room service appropriate? Yes; Fluid consistency: Thin Diet - low sodium heart healthy  Precautions / Restrictions Precautions Precautions: Fall Restrictions Weight Bearing Restrictions: Yes RLE Weight Bearing: Weight bearing as tolerated LLE Weight Bearing: Weight bearing as tolerated   Has the patient  had 2 or more falls or a fall with injury in the past year?No  Prior Activity Level Limited Community (1-2x/wk): no driving; no AD: has lady 2-3 times per week for housekeeping and to be present when she showers  Home Assistive Devices / [place] Devices/Equipment: Radio producer (specify quad or straight) Home Equipment: Cane - single point  Prior Device Use: Indicate devices/aids used by the patient prior to current illness, exacerbation or injury? cane  Prior Functional Level Prior Function Level of Independence: Independent with assistive device(s) Comments: uses cane  Self Care: Did the patient need help bathing, dressing, using the toilet or eating?  Needed some help aide three times per week  Indoor Mobility: Did the patient need assistance with walking from room to room (with or without device)? Independent  Stairs: Did the patient need assistance with internal or external stairs (with or without device)? Needed some help  Functional Cognition: Did the patient need help planning regular tasks such as shopping or remembering to take medications? Needed some help  Current Functional Level Cognition  Overall Cognitive Status: History of cognitive impairments - at baseline Orientation Level: Oriented to person, Oriented to place, Disoriented to situation General Comments: Pt with history of dementia, gets upset if confronted with her memory loss, but is very pleasant overall and easily redirected    Extremity Assessment (includes Sensation/Coordination)  Upper Extremity Assessment: Generalized weakness  Lower Extremity Assessment: Generalized weakness    ADLs       Mobility  General bed mobility comments: pt in recliner on arrival    Transfers  Overall transfer level: Needs assistance Equipment used: Rolling walker (2 wheeled) Transfers: Sit to/from Stand Sit to Stand: Min assist Stand pivot transfers: Min assist General transfer comment: pt  transfers well, requires min assist for forward weight shift and min verbal cues for hand placement when transferring sit<>stand    Ambulation / Gait / Stairs / Wheelchair Mobility  Ambulation/Gait Ambulation/Gait assistance: Museum/gallery curator (Feet): 50  Feet Assistive device: Rolling walker (2 wheeled) Gait Pattern/deviations: Step-to pattern, Antalgic, Trunk flexed General Gait Details: min verbal cues for walker positioning and use of UEs to reduce weightbearing in RLE 2/2 pain Gait velocity: decreased Gait velocity interpretation: Below normal speed for age/gender    Posture / Balance Balance Overall balance assessment: Needs assistance, History of Falls Sitting-balance support: Feet supported Sitting balance-Leahy Scale: Fair Standing balance support: Bilateral upper extremity supported Standing balance-Leahy Scale: Poor    Special needs/care consideration BiPAP/CPAP  N/a CPM  N/a Continuous Drip IV n/a Dialysis  N/a Life Vest  N/a Oxygen  N/a Special Bed  N/a Trach Size  N/a Wound Vac n/a Skin intact                            Bowel mgmt: continent LB< 08/01/2017 Bladder mgmt: incontinent Diabetic mgmt  N/a Bed alarm for impulsive with baseline cognitive dysfunction   Previous Home Environment Living Arrangements:  (son, Laverna Peace lives within 7 miles of pt) Available Help at Discharge: Family, Available PRN/intermittently, Personal care attendant Type of Home: House Home Layout: One level Home Access: Level entry Bathroom Shower/Tub: Tub/shower unit, Curtain, Multimedia programmer: Standard Bathroom Accessibility: Yes How Accessible: Accessible via walker [place] Wheeler: Yes Type of Home Care Services: [place] (if known): [facility] Additional Comments: pt has an attendant 3 days/ wk for 4 hrs. Her son and daughter in law check on her and do the cooking  Discharge Living Setting Plans for Discharge Living  Setting: Patient's home, Alone Type of Home at Discharge: House Discharge Home Layout: One level Discharge Home Access: Level entry Discharge Bathroom Shower/Tub: Tub/shower unit, Walk-in shower, Curtain Discharge Bathroom Toilet: Standard Discharge Bathroom Accessibility: Yes How Accessible: Accessible via walker Does the patient have any problems obtaining your medications?: No  Social/Family/Support Systems Patient Roles: Parent (has 3 children) Contact Information: son, Laverna Peace Anticipated Caregiver: Laverna Peace an dhis wife prn Anticipated Caregiver's Contact Information: see above Ability/Limitations of Caregiver: son and daughter in law work; daughter in law comes dialy in the am to check on pt before she goes to [employer] Caregiver Availability: Intermittent Discharge Plan Discussed with Primary Caregiver: Yes Is Caregiver In Agreement with Plan?: Yes Does Caregiver/Family have Issues with Lodging/Transportation while Pt is in Rehab?: No  Goals/Additional Needs Patient/Family Goal for Rehab: Mod I to superivsion with PT and OT Expected length of stay: ELOS 7- 1o days Equipment Needs: bed alarm and chair alarm for impulsive may nbe needed Pt/Family Agrees to Admission and willing to participate: Yes Program Orientation Provided & Reviewed with Pt/Caregiver Including Roles  & Responsibilities: Yes  Barriers to Discharge: Decreased caregiver support  Decrease burden of Care through IP rehab admission: n/a  Possible need for SNF placement upon discharge: if she does not reach Mod I to intermittent supervision level  Patient Condition: This patient's condition remains as documented in the consult dated 08/02/2017, in which the Rehabilitation Physician determined and documented that the patient's condition is appropriate for intensive rehabilitative care in an inpatient rehabilitation facility. Will admit to inpatient rehab today.  Preadmission Screen Completed By:  Cleatrice Burke, 08/02/2017 4:27 PM ______________________________________________________________________   Discussed status with Dr. Naaman Plummer on 08/02/2017 at  1633 and received telephone approval for admission today.  Admission Coordinator:  Cleatrice Burke, time 6962 Date 08/02/2017       Cosigned by: Meredith Staggers, MD at 08/02/2017 4:37  PM  Revision History

## 2017-08-02 NOTE — Progress Notes (Signed)
Called report to [facility] on 4MW. Pt transferred to 4MW room 08 with nursing staff Pt belongings packed and given to pt daughter in law, Coralyn Mark. Pt telemetry removed, pt still has IV per RN on 4MW request

## 2017-08-02 NOTE — Clinical Social Work Placement (Signed)
   CLINICAL SOCIAL WORK PLACEMENT  NOTE  Date:  08/02/2017  Patient Details  Name: Charlene Duncan MRN: [id number] Date of Birth: 25-Apr-1936  Clinical Social Work is seeking post-discharge placement for this patient at the Black level of care (*CSW will initial, date and re-position this form in  chart as items are completed):  Yes   Patient/family provided with [facility] Work Department's list of facilities offering this level of care within the geographic area requested by the patient (or if unable, by the patient's family).  Yes   Patient/family informed of their freedom to choose among providers that offer the needed level of care, that participate in Medicare, Medicaid or managed care program needed by the patient, have an available bed and are willing to accept the patient.  Yes   Patient/family informed of [facility]'s ownership interest in [facility] and [facility], as well as of the fact that they are under no obligation to receive care at these facilities.  PASRR submitted to EDS on 08/02/17     PASRR number received on       Existing PASRR number confirmed on       FL2 transmitted to all facilities in geographic area requested by pt/family on 08/02/17     FL2 transmitted to all facilities within larger geographic area on       Patient informed that his/her managed care company has contracts with or will negotiate with certain facilities, including the following:            Patient/family informed of bed offers received.  Patient chooses bed at       Physician recommends and patient chooses bed at      Patient to be transferred to   on  .  Patient to be transferred to facility by       Patient family notified on   of transfer.  Name of family member notified:        PHYSICIAN Please sign FL2     Additional Comment:    _______________________________________________ Candie Chroman, LCSW 08/02/2017, 3:41  PM

## 2017-08-02 NOTE — Progress Notes (Signed)
Meredith Staggers, MD Physician Signed Physical Medicine and Rehabilitation  Consult Note Date of Service: 08/02/2017 4:12 PM  Related encounter: ED to Hosp-Admission (Current) from 07/31/2017 in [facility]     Expand All Collapse All   [] Hide copied text [] Hover for attribution information      Physical Medicine and Rehabilitation Consult Reason for Consult:gait difficulties after multiple fractures Referring Physician: Wendee Beavers   HPI: Charlene Duncan is a 81 y.o. female with dementia, RA who lives with intermittent assistance at the home, who fell at home on  07/31/17 suffering acute fractures of the right puc rami as well as an L1 compression fracture. Imaging was also notable for multiple chronic lumbar and thoracic compression fractures. Pt has struggled with mobility due to pain and lower extremity weakness. PM&R was consulted to assess for rehab needs and felt that the patient would benefit from an inpatient rehab admission   Review of Systems  Constitutional: Negative for fever.  HENT: Negative for hearing loss.   Eyes: Negative for blurred vision and double vision.  Respiratory: Negative for cough.   Cardiovascular: Negative for chest pain.  Gastrointestinal: Negative for nausea and vomiting.  Genitourinary: Negative for dysuria.  Musculoskeletal: Positive for back pain, falls, joint pain and myalgias.  Skin: Negative for rash.  Neurological: Negative for dizziness, sensory change, speech change and headaches.  Psychiatric/Behavioral: Negative for suicidal ideas.       Past Medical History:  Diagnosis Date  . Alzheimer disease 05/16/2017  . Anemia   . Breast CA ([place])   . Cancer (H[facility])    breast  . High cholesterol   . Iron deficiency   . Meniere disease   . Myalgia   . OA (osteoarthritis) of knee   . Thyroid disease    hypothyroid  . Vitamin B 12 deficiency         Past Surgical History:  Procedure Laterality Date  .  BREAST LUMPECTOMY    . CATARACT EXTRACTION          Family History  Problem Relation Age of Onset  . Arthritis Mother   . Diabetes Mother   . Cirrhosis Sister   . Cirrhosis Brother    Social History:  reports that she has never smoked. She has never used smokeless tobacco. She reports that she does not drink alcohol or use drugs. Allergies:      Allergies  Allergen Reactions  . Naproxen Hives         Medications Prior to Admission  Medication Sig Dispense Refill  . acetaZOLAMIDE (DIAMOX) 250 MG tablet Take 250 mg by mouth as needed (Meniere's).     . celecoxib (CELEBREX) 200 MG capsule Take 200 mg by mouth daily as needed for mild pain.     . Cholecalciferol (VITAMIN D PO) Take 1 tablet by mouth daily.    . DULoxetine (CYMBALTA) 30 MG capsule Take 30 mg by mouth daily.     . Iron-FA-B Cmp-C-Biot-Probiotic (FUSION PLUS PO) Take 1 capsule by mouth daily.    [name] levothyroxine (SYNTHROID, LEVOTHROID) 88 MCG tablet Take 88 mcg by mouth every other day.     . methotrexate 2.5 MG tablet Take 10 mg by mouth once a week. THURSDAYS    . predniSONE (DELTASONE) 10 MG tablet Take 5 mg by mouth daily.     . vitamin B-12 (CYANOCOBALAMIN) 500 MCG tablet Take 500 mcg by mouth daily.    [name] donepezil (ARICEPT) 5 MG tablet TAKE 1  TABLET BY MOUTH AT BEDTIME (Patient not taking: Reported on 07/31/2017) 30 tablet 2    Home: [place] expects to be discharged to:: Private residence Living Arrangements: Alone Available Help at Discharge: Family, Available PRN/intermittently, Personal care attendant Type of Home: House Home Access: Level entry Home Layout: One level Home Equipment: Cane - single point Additional Comments: pt has an attendant 3 days/ wk for 4 hrs. Her son and daughter in law check on her and do the cooking  Functional History: Prior Function Level of Independence: Independent with assistive device(s) Comments: uses cane Functional  Status:  Mobility: Bed Mobility General bed mobility comments: pt in recliner on arrival Transfers Overall transfer level: Needs assistance Equipment used: Rolling walker (2 wheeled) Transfers: Sit to/from Stand Sit to Stand: Min assist Stand pivot transfers: Min assist General transfer comment: pt transfers well, requires min assist for forward weight shift and min verbal cues for hand placement when transferring sit<>stand Ambulation/Gait Ambulation/Gait assistance: Min assist Ambulation Distance (Feet): 50 Feet Assistive device: Rolling walker (2 wheeled) Gait Pattern/deviations: Step-to pattern, Antalgic, Trunk flexed General Gait Details: min verbal cues for walker positioning and use of UEs to reduce weightbearing in RLE 2/2 pain Gait velocity: decreased Gait velocity interpretation: Below normal speed for age/gender  ADL:  Cognition: Cognition Overall Cognitive Status: History of cognitive impairments - at baseline Orientation Level: Oriented to person, Oriented to place, Disoriented to situation Cognition Arousal/Alertness: Awake/alert Behavior During Therapy: Anxious Overall Cognitive Status: History of cognitive impairments - at baseline General Comments: Pt with history of dementia, gets upset if confronted with her memory loss, but is very pleasant overall and easily redirected  Blood pressure (!) 133/97, pulse 86, temperature 98.3 F (36.8 C), temperature source Oral, resp. rate 16, height 5' (1.524 m), weight 49 kg (108 lb), SpO2 97 %. Physical Exam  Constitutional: She appears well-developed.  Eyes: Pupils are equal, round, and reactive to light.  Neck: Normal range of motion.  Cardiovascular: Normal rate.   Respiratory: Effort normal.  GI: She exhibits no distension.  Musculoskeletal: She exhibits tenderness. She exhibits no edema or deformity.  Neurological: She is alert.  Skin: Skin is warm.  Psychiatric: She has a normal mood and affect.    Lab  Results Last 24 Hours  No results found for this or any previous visit (from the past 24 hour(s)).    Imaging Results (Last 48 hours)  Dg Chest 2 View  Result Date: 08/01/2017 CLINICAL DATA:  Evaluate right basilar lung opacity identified on CT abdomen and pelvis yesterday. Personal history of right breast cancer. EXAM: CHEST  2 VIEW COMPARISON:  Images of the lung bases from CT abdomen and pelvis yesterday. CT chest 01/15/2016. FINDINGS: AP erect and lateral images were obtained. Cardiac silhouette upper normal in size for AP technique. Thoracic aorta atherosclerotic. Hilar and mediastinal contours otherwise unremarkable. Numerous likely remote right rib fractures with nonunion. Pleuroparenchymal scarring involving the lateral right lung and pleural space, accounting for the opacity on CT. Mildly prominent bronchovascular markings diffusely. Lungs otherwise clear. No pleural effusions. Generalized osseous demineralization. IMPRESSION: 1. Lateral pleuroparenchymal scarring on the right, accounting for the opacity identified on in the CT yesterday. 2.  No acute cardiopulmonary disease. Electronically Signed   By: Evangeline Dakin M.D.   On: 08/01/2017 08:36   Ct Head Wo Contrast  Result Date: 07/31/2017 CLINICAL DATA:  81 year old female status post fall with headache. EXAM: CT HEAD WITHOUT CONTRAST TECHNIQUE: Contiguous axial images were obtained from the  base of the skull through the vertex without intravenous contrast. COMPARISON:  Head CT 05/05/2017 and earlier. FINDINGS: Brain: Stable cerebral volume and ventriculomegaly. There is associated chronic mesial temporal lobe and anterior temporal tip volume loss. No midline shift, mass effect, evidence of mass lesion, intracranial hemorrhage or evidence of cortically based acute infarction. Gray-white matter differentiation is within normal limits throughout the brain. No focal encephalomalacia identified. Vascular: Calcified atherosclerosis at the skull  base. No suspicious intracranial vascular hyperdensity. Skull: No skull fracture identified. No acute osseous abnormality identified. Sinuses/Orbits: Visualized paranasal sinuses and mastoids are stable and well pneumatized; chronic mild opacification of the inferior right mastoid air cells. Other: No scalp hematoma identified. Negative visible orbit soft tissues and deep soft tissue spaces of the face. IMPRESSION: 1. No acute intracranial abnormality. No acute traumatic injury identified. 2. Stable chronic ventriculomegaly, perhaps ex vacuo related. Electronically Signed   By: Genevie Ann M.D.   On: 07/31/2017 21:09   Ct Abdomen Pelvis W Contrast  Result Date: 07/31/2017 CLINICAL DATA:  Back pain. Known pelvic fracture after falling out of bed yesterday. EXAM: CT ABDOMEN AND PELVIS WITH CONTRAST TECHNIQUE: Multidetector CT imaging of the abdomen and pelvis was performed using the standard protocol following bolus administration of intravenous contrast. CONTRAST:  134mL ISOVUE-300 IOPAMIDOL (ISOVUE-300) INJECTION 61% COMPARISON:  10/08/2016 FINDINGS: Lower chest: Scarring in the lateral right base. No suspicious focal lung base lesion. No consolidation or effusion. Hepatobiliary: No focal liver abnormality is seen. No gallstones, gallbladder wall thickening, or biliary dilatation. Pancreas: Mildly atrophic with associated ductal dilatation. No pancreatic parenchymal mass. No inflammation or fluid collection. Spleen: Normal in size without focal abnormality. Adrenals/Urinary Tract: Adrenal glands are unremarkable. Kidneys are normal, without renal calculi, focal lesion, or hydronephrosis. Bladder is unremarkable. Stomach/Bowel: Stomach, small bowel and colon are remarkable only for a generous volume of fluid throughout small bowel and a generous volume of colonic stool. No evidence of bowel obstruction. No focal inflammation of bowel. No extraluminal gas. Vascular/Lymphatic: The abdominal aorta is normal in caliber  with extensive atherosclerotic calcification. No evidence of intra-abdominal vascular injury. No adenopathy in the abdomen or pelvis. Reproductive: Uterus and bilateral adnexa are unremarkable. Other: Mildly displaced mildly comminuted superior and inferior right pubic ramus fractures. Visible sacral fracture. No diastases of the sacroiliac joints or pubic symphysis. Small pelvic sidewall intramuscular hemorrhage, with mild thickening of the right obturator internus. No drainable hematoma. Musculoskeletal: Multiple compressions of upper lumbar and lower thoracic vertebrae. The L1 compression appears to be acute. See accompanying lumbar spine CT for details. No significant focal bone lesions. IMPRESSION: 1. Acute fractures of the right pubic rami, mildly comminuted. No drainable hematoma. No SI or pubic symphysis diastases. 2. Acute L1 vertebral compression. Multiple chronic lumbar and lower thoracic compressions. 3. Pancreatic atrophy.  No pancreatic mass or inflammation. 4. Aortic atherosclerosis. 5. No intra-abdominal vascular injury. No parenchymal organ injury. No peritoneal blood or free air. 6. Irregular opacity in the lateral right lung base, likely scarring although not conclusively characterized. Attention to this area on follow-up is recommended to assure stability. Electronically Signed   By: Andreas Newport M.D.   On: 07/31/2017 21:31   Ct L-spine No Charge  Result Date: 07/31/2017 CLINICAL DATA:  Trauma. Lumbosacral fracture. Fall out of bed today with lumbosacral back pain. EXAM: CT LUMBAR SPINE WITHOUT CONTRAST TECHNIQUE: Multidetector CT imaging of the lumbar spine was performed without intravenous contrast administration. Multiplanar CT image reconstructions were also generated. Imaging reformatted from abdominopelvic CT.  COMPARISON:  Lumbar spine radiographs 10/08/2016. Abdominopelvic CT performed concurrently. FINDINGS: Segmentation: 5 lumbar type vertebrae. Alignment: Chronic grade 1  anterolisthesis of L4 on L5. Alignment is otherwise maintained. Vertebrae: Acute compression fracture with approximately 30% loss of height of superior endplate. Minimal extension of posterior cortex without significant retropulsion. No posterior element extension. L2 compression fracture is chronic, however progressed from prior radiograph with increased loss of height centrally and involving the inferior endplate. Minimal involvement of the posterior cortex without retropulsion. Mild compression fractures superior endplate of E36 and O29 of unknown acuity, but new from 11/30 September 2016 radiograph. No posterior cortex involvement. No focal bone lesion. Paraspinal and other soft tissues: Perispinal musculature is intact without hematoma. Intra-abdominal structures better assessed on dedicated abdominal CT. Disc levels: Disc space narrowing and endplate spurring at U7-M5 and L5-S1, similar in degree to prior exam. Facet arthropathy at L4-L5. No narrowing of the spinal canal. IMPRESSION: 1. Acute L1 compression fracture with approximately 30% loss of height. Minimal posterior cortex involvement without retropulsion. 2. Moderate L2 compression fracture is chronic, but has progressed from comparison radiograph of November 2017. Progressive loss of height centrally with new inferior endplate component. 3. Mild T11 and T12 superior endplate compression fractures, new from prior radiograph but age indeterminate. Electronically Signed   By: Jeb Levering M.D.   On: 07/31/2017 21:45     Assessment/Plan: Diagnosis: right pubic rami and L1 compression fracture after fall 1. Does the need for close, 24 hr/day medical supervision in concert with the patient's rehab needs make it unreasonable for this patient to be served in a less intensive setting? Yes 2. Co-Morbidities requiring supervision/potential complications: pain mgt, anemia, nutrition, cognition 3. Due to bladder management, bowel management, safety,  skin/wound care, disease management, medication administration, pain management and patient education, does the patient require 24 hr/day rehab nursing? Yes 4. Does the patient require coordinated care of a physician, rehab nurse, PT (1-2 hrs/day, 5 days/week) and OT (1-2 hrs/day, 5 days/week) to address physical and functional deficits in the context of the above medical diagnosis(es)? Yes Addressing deficits in the following areas: balance, endurance, locomotion, strength, transferring, bowel/bladder control, bathing, dressing, feeding, grooming, toileting, cognition and psychosocial support 5. Can the patient actively participate in an intensive therapy program of at least 3 hrs of therapy per day at least 5 days per week? Yes 6. The potential for patient to make measurable gains while on inpatient rehab is excellent 7. Anticipated functional outcomes upon discharge from inpatient rehab are modified independent and supervision  with PT, modified independent and supervision with OT, n/a with SLP. 8. Estimated rehab length of stay to reach the above functional goals is: 7-9 days 9. Anticipated D/C setting: Home 10. Anticipated post D/C treatments: HH therapy and Outpatient therapy 11. Overall Rehab/Functional Prognosis: excellent  RECOMMENDATIONS: This patient's condition is appropriate for continued rehabilitative care in the following setting: CIR Patient has agreed to participate in recommended program. Yes Note that insurance prior authorization may be required for reimbursement for recommended care.  Comment: Rehab Admissions Coordinator to follow up.  Thanks,  Meredith Staggers, MD, Mellody Drown    Meredith Staggers, MD 08/02/2017     Routing History

## 2017-08-02 NOTE — H&P (Signed)
Physical Medicine and Rehabilitation Admission H&P       Chief Complaint  Patient presents with  . pelvic fx  : HPI:  Charlene Duncan a 81 y.o.femalewith dementia, RA who lives with intermittent assistance at the home, who fell at home on 07/31/17 suffering acute fractures of the right puc rami as well as an L1 compression fracture. Imaging was also notable for multiple chronic lumbar and thoracic compression fractures. Pt has struggled with mobility due to pain and lower extremity weakness. PM&R was consulted to assess for rehab needs and felt that the patient would benefit from an inpatient rehab admission. Pt is admitted today to maximize functional gains  Review of Systems  Constitutional: Negative for chills and fever.  HENT: Negative for hearing loss and tinnitus.   Eyes: Negative for blurred vision and photophobia.  Respiratory: Negative for cough and hemoptysis.   Cardiovascular: Negative for chest pain and palpitations.  Gastrointestinal: Negative for abdominal pain, nausea and vomiting.  Genitourinary: Negative for dysuria and urgency.  Musculoskeletal: Positive for back pain, falls, joint pain and myalgias.  Skin: Negative for itching and rash.  Neurological: Negative for dizziness, loss of consciousness and headaches.  Psychiatric/Behavioral: Positive for memory loss. Negative for depression.       Past Medical History:  Diagnosis Date  . Alzheimer disease 05/16/2017  . Anemia   . Breast CA ([place])   . Cancer ([facility])    breast  . High cholesterol   . Iron deficiency   . Meniere disease   . Myalgia   . OA (osteoarthritis) of knee   . Thyroid disease    hypothyroid  . Vitamin B 12 deficiency         Past Surgical History:  Procedure Laterality Date  . BREAST LUMPECTOMY    . CATARACT EXTRACTION          Family History  Problem Relation Age of Onset  . Arthritis Mother   . Diabetes Mother   . Cirrhosis Sister   . Cirrhosis  Brother    Social History:  reports that she has never smoked. She has never used smokeless tobacco. She reports that she does not drink alcohol or use drugs. Allergies:      Allergies  Allergen Reactions  . Naproxen Hives         Medications Prior to Admission  Medication Sig Dispense Refill  . acetaZOLAMIDE (DIAMOX) 250 MG tablet Take 250 mg by mouth as needed (Meniere's).     . celecoxib (CELEBREX) 200 MG capsule Take 200 mg by mouth daily as needed for mild pain.     . Cholecalciferol (VITAMIN D PO) Take 1 tablet by mouth daily.    . DULoxetine (CYMBALTA) 30 MG capsule Take 30 mg by mouth daily.     . Iron-FA-B Cmp-C-Biot-Probiotic (FUSION PLUS PO) Take 1 capsule by mouth daily.    [name] levothyroxine (SYNTHROID, LEVOTHROID) 88 MCG tablet Take 88 mcg by mouth every other day.     . methotrexate 2.5 MG tablet Take 10 mg by mouth once a week. THURSDAYS    . predniSONE (DELTASONE) 10 MG tablet Take 5 mg by mouth daily.     . vitamin B-12 (CYANOCOBALAMIN) 500 MCG tablet Take 500 mcg by mouth daily.    [name] donepezil (ARICEPT) 5 MG tablet TAKE 1 TABLET BY MOUTH AT BEDTIME (Patient not taking: Reported on 07/31/2017) 30 tablet 2     Drug regimen was reviewed and remains appropriate with no significant issues  identified  Home: Home Living Family/patient expects to be discharged to:: Private residence Living Arrangements: Alone Available Help at Discharge: Family, Available PRN/intermittently, Personal care attendant Type of Home: House Home Access: Level entry Home Layout: One level Home Equipment: Cane - single point Additional Comments: pt has an attendant 3 days/ wk for 4 hrs. Her son and daughter in law check on her and do the cooking   Functional History: Prior Function Level of Independence: Independent with assistive device(s) Comments: uses cane  Functional Status:  Mobility: Bed Mobility General bed mobility comments: pt in recliner on  arrival Transfers Overall transfer level: Needs assistance Equipment used: Rolling walker (2 wheeled) Transfers: Sit to/from Stand Sit to Stand: Min assist Stand pivot transfers: Min assist General transfer comment: pt transfers well, requires min assist for forward weight shift and min verbal cues for hand placement when transferring sit<>stand Ambulation/Gait Ambulation/Gait assistance: Min assist Ambulation Distance (Feet): 50 Feet Assistive device: Rolling walker (2 wheeled) Gait Pattern/deviations: Step-to pattern, Antalgic, Trunk flexed General Gait Details: min verbal cues for walker positioning and use of UEs to reduce weightbearing in RLE 2/2 pain Gait velocity: decreased Gait velocity interpretation: Below normal speed for age/gender  ADL:  Cognition: Cognition Overall Cognitive Status: History of cognitive impairments - at baseline Orientation Level: Oriented to person, Oriented to place, Disoriented to situation Cognition Arousal/Alertness: Awake/alert Behavior During Therapy: Anxious Overall Cognitive Status: History of cognitive impairments - at baseline General Comments: Pt with history of dementia, gets upset if confronted with her memory loss, but is very pleasant overall and easily redirected  Physical Exam: Blood pressure (!) 133/97, pulse 86, temperature 98.3 F (36.8 C), temperature source Oral, resp. rate 16, height 5' (1.524 m), weight 49 kg (108 lb), SpO2 97 %. Physical Exam  Constitutional: She appears well-developed. No distress.  HENT:  Head: Normocephalic and atraumatic.  Eyes: Pupils are equal, round, and reactive to light. Conjunctivae are normal.  Neck: No tracheal deviation present. No thyromegaly present.  Cardiovascular: Normal rate.  Exam reveals no friction rub.   No murmur heard. Respiratory: No respiratory distress. She has no wheezes. She has no rales.  GI: Soft. She exhibits no distension. There is no tenderness.  Musculoskeletal:  She exhibits no edema or tenderness.  Tenderness along low back with flexion and general palpation. Right hip tender with basic attempts at HF,rotation.   Skin: Skin is warm.  A few bruises along right pelvis  Psychiatric: She has a normal mood and affect.  Occasionally anxious    Lab Results Last 48 Hours        Results for orders placed or performed during the hospital encounter of 07/31/17 (from the past 48 hour(s))  Comprehensive metabolic panel     Status: Abnormal   Collection Time: 08/01/17  4:09 AM  Result Value Ref Range   Sodium 137 135 - 145 mmol/L   Potassium 3.6 3.5 - 5.1 mmol/L   Chloride 102 101 - 111 mmol/L   CO2 28 22 - 32 mmol/L   Glucose, Bld 95 65 - 99 mg/dL   BUN 21 (H) 6 - 20 mg/dL   Creatinine, Ser 5.03 0.44 - 1.00 mg/dL   Calcium 8.9 8.9 - 99.7 mg/dL   Total Protein 6.0 (L) 6.5 - 8.1 g/dL   Albumin 3.1 (L) 3.5 - 5.0 g/dL   AST 26 15 - 41 U/L   ALT 23 14 - 54 U/L   Alkaline Phosphatase 50 38 - 126 U/L   Total  Bilirubin 0.7 0.3 - 1.2 mg/dL   GFR calc non Af Amer >60 >60 mL/min   GFR calc Af Amer >60 >60 mL/min    Comment: (NOTE) The eGFR has been calculated using the CKD EPI equation. This calculation has not been validated in all clinical situations. eGFR's persistently <60 mL/min signify possible Chronic Kidney Disease.    Anion gap 7 5 - 15  CBC     Status: Abnormal   Collection Time: 08/01/17  4:09 AM  Result Value Ref Range   WBC 9.1 4.0 - 10.5 K/uL   RBC 3.20 (L) 3.87 - 5.11 MIL/uL   Hemoglobin 10.1 (L) 12.0 - 15.0 g/dL   HCT 31.8 (L) 36.0 - 46.0 %   MCV 99.4 78.0 - 100.0 fL   MCH 31.6 26.0 - 34.0 pg   MCHC 31.8 30.0 - 36.0 g/dL   RDW 15.7 (H) 11.5 - 15.5 %   Platelets 262 150 - 400 K/uL  Hemoglobin A1c     Status: Abnormal   Collection Time: 08/01/17  4:09 AM  Result Value Ref Range   Hgb A1c MFr Bld 5.8 (H) 4.8 - 5.6 %    Comment: (NOTE) Pre diabetes:          5.7%-6.4% Diabetes:               >6.4% Glycemic control for   <7.0% adults with diabetes    Mean Plasma Glucose 119.76 mg/dL      Imaging Results (Last 48 hours)  Dg Chest 2 View  Result Date: 08/01/2017 CLINICAL DATA:  Evaluate right basilar lung opacity identified on CT abdomen and pelvis yesterday. Personal history of right breast cancer. EXAM: CHEST  2 VIEW COMPARISON:  Images of the lung bases from CT abdomen and pelvis yesterday. CT chest 01/15/2016. FINDINGS: AP erect and lateral images were obtained. Cardiac silhouette upper normal in size for AP technique. Thoracic aorta atherosclerotic. Hilar and mediastinal contours otherwise unremarkable. Numerous likely remote right rib fractures with nonunion. Pleuroparenchymal scarring involving the lateral right lung and pleural space, accounting for the opacity on CT. Mildly prominent bronchovascular markings diffusely. Lungs otherwise clear. No pleural effusions. Generalized osseous demineralization. IMPRESSION: 1. Lateral pleuroparenchymal scarring on the right, accounting for the opacity identified on in the CT yesterday. 2.  No acute cardiopulmonary disease. Electronically Signed   By: Evangeline Dakin M.D.   On: 08/01/2017 08:36   Ct Head Wo Contrast  Result Date: 07/31/2017 CLINICAL DATA:  81 year old female status post fall with headache. EXAM: CT HEAD WITHOUT CONTRAST TECHNIQUE: Contiguous axial images were obtained from the base of the skull through the vertex without intravenous contrast. COMPARISON:  Head CT 05/05/2017 and earlier. FINDINGS: Brain: Stable cerebral volume and ventriculomegaly. There is associated chronic mesial temporal lobe and anterior temporal tip volume loss. No midline shift, mass effect, evidence of mass lesion, intracranial hemorrhage or evidence of cortically based acute infarction. Gray-white matter differentiation is within normal limits throughout the brain. No focal encephalomalacia identified. Vascular: Calcified atherosclerosis at the  skull base. No suspicious intracranial vascular hyperdensity. Skull: No skull fracture identified. No acute osseous abnormality identified. Sinuses/Orbits: Visualized paranasal sinuses and mastoids are stable and well pneumatized; chronic mild opacification of the inferior right mastoid air cells. Other: No scalp hematoma identified. Negative visible orbit soft tissues and deep soft tissue spaces of the face. IMPRESSION: 1. No acute intracranial abnormality. No acute traumatic injury identified. 2. Stable chronic ventriculomegaly, perhaps ex vacuo related. Electronically Signed   By:  Genevie Ann M.D.   On: 07/31/2017 21:09   Ct Abdomen Pelvis W Contrast  Result Date: 07/31/2017 CLINICAL DATA:  Back pain. Known pelvic fracture after falling out of bed yesterday. EXAM: CT ABDOMEN AND PELVIS WITH CONTRAST TECHNIQUE: Multidetector CT imaging of the abdomen and pelvis was performed using the standard protocol following bolus administration of intravenous contrast. CONTRAST:  180m ISOVUE-300 IOPAMIDOL (ISOVUE-300) INJECTION 61% COMPARISON:  10/08/2016 FINDINGS: Lower chest: Scarring in the lateral right base. No suspicious focal lung base lesion. No consolidation or effusion. Hepatobiliary: No focal liver abnormality is seen. No gallstones, gallbladder wall thickening, or biliary dilatation. Pancreas: Mildly atrophic with associated ductal dilatation. No pancreatic parenchymal mass. No inflammation or fluid collection. Spleen: Normal in size without focal abnormality. Adrenals/Urinary Tract: Adrenal glands are unremarkable. Kidneys are normal, without renal calculi, focal lesion, or hydronephrosis. Bladder is unremarkable. Stomach/Bowel: Stomach, small bowel and colon are remarkable only for a generous volume of fluid throughout small bowel and a generous volume of colonic stool. No evidence of bowel obstruction. No focal inflammation of bowel. No extraluminal gas. Vascular/Lymphatic: The abdominal aorta is normal in  caliber with extensive atherosclerotic calcification. No evidence of intra-abdominal vascular injury. No adenopathy in the abdomen or pelvis. Reproductive: Uterus and bilateral adnexa are unremarkable. Other: Mildly displaced mildly comminuted superior and inferior right pubic ramus fractures. Visible sacral fracture. No diastases of the sacroiliac joints or pubic symphysis. Small pelvic sidewall intramuscular hemorrhage, with mild thickening of the right obturator internus. No drainable hematoma. Musculoskeletal: Multiple compressions of upper lumbar and lower thoracic vertebrae. The L1 compression appears to be acute. See accompanying lumbar spine CT for details. No significant focal bone lesions. IMPRESSION: 1. Acute fractures of the right pubic rami, mildly comminuted. No drainable hematoma. No SI or pubic symphysis diastases. 2. Acute L1 vertebral compression. Multiple chronic lumbar and lower thoracic compressions. 3. Pancreatic atrophy.  No pancreatic mass or inflammation. 4. Aortic atherosclerosis. 5. No intra-abdominal vascular injury. No parenchymal organ injury. No peritoneal blood or free air. 6. Irregular opacity in the lateral right lung base, likely scarring although not conclusively characterized. Attention to this area on follow-up is recommended to assure stability. Electronically Signed   By: DAndreas NewportM.D.   On: 07/31/2017 21:31   Ct L-spine No Charge  Result Date: 07/31/2017 CLINICAL DATA:  Trauma. Lumbosacral fracture. Fall out of bed today with lumbosacral back pain. EXAM: CT LUMBAR SPINE WITHOUT CONTRAST TECHNIQUE: Multidetector CT imaging of the lumbar spine was performed without intravenous contrast administration. Multiplanar CT image reconstructions were also generated. Imaging reformatted from abdominopelvic CT. COMPARISON:  Lumbar spine radiographs 10/08/2016. Abdominopelvic CT performed concurrently. FINDINGS: Segmentation: 5 lumbar type vertebrae. Alignment: Chronic grade  1 anterolisthesis of L4 on L5. Alignment is otherwise maintained. Vertebrae: Acute compression fracture with approximately 30% loss of height of superior endplate. Minimal extension of posterior cortex without significant retropulsion. No posterior element extension. L2 compression fracture is chronic, however progressed from prior radiograph with increased loss of height centrally and involving the inferior endplate. Minimal involvement of the posterior cortex without retropulsion. Mild compression fractures superior endplate of [id number] [id number] unknown acuity, but new from 11/30 September 2016 radiograph. No posterior cortex involvement. No focal bone lesion. Paraspinal and other soft tissues: Perispinal musculature is intact without hematoma. Intra-abdominal structures better assessed on dedicated abdominal CT. Disc levels: Disc space narrowing and endplate spurring at LW2-O3and L5-S1, similar in degree to prior exam. Facet arthropathy at L4-L5. No narrowing of  the spinal canal. IMPRESSION: 1. Acute L1 compression fracture with approximately 30% loss of height. Minimal posterior cortex involvement without retropulsion. 2. Moderate L2 compression fracture is chronic, but has progressed from comparison radiograph of November 2017. Progressive loss of height centrally with new inferior endplate component. 3. Mild T11 and T12 superior endplate compression fractures, new from prior radiograph but age indeterminate. Electronically Signed   By: Jeb Levering M.D.   On: 07/31/2017 21:45        Medical Problem List and Plan: 1.  Functional deficits and gait disorder secondary to right pubic rami fracture, L1 compression fracture             -admit to inpatient rehab 2.  DVT Prophylaxis/Anticoagulation: Pharmaceutical: Lovenox '30mg'$  sq daily 3. Pain Management: tylenol and celebrex prn.              -tramadol for more severe pain 4. Mood: cymbalta per home regimen. Mood seems up beat             -she's  anxious to return home 5. Neuropsych: This patient is capable of making decisions on her own behalf.             -pt has not been using aricept at home 6. Skin/Wound Care: local care, optimize nutrition 7. Fluids/Electrolytes/Nutrition: encourage PO             -protein supps             -check admission labs 8. RA: continue prednisone per baseline 7.'5mg'$  daily             -MTX '10mg'$  weekly on Thursdays 9. Hypothyroid: synthroid 61mg daily      Post Admission Physician Evaluation: 1. Functional deficits secondary  to pelvic and lumbar fractures. 2. Patient is admitted to receive collaborative, interdisciplinary care between the physiatrist, rehab nursing staff, and therapy team. 3. Patient's level of medical complexity and substantial therapy needs in context of that medical necessity cannot be provided at a lesser intensity of care such as a SNF. 4. Patient has experienced substantial functional loss from his/her baseline which was documented above under the "Functional History" and "Functional Status" headings.  Judging by the patient's diagnosis, physical exam, and functional history, the patient has potential for functional progress which will result in measurable gains while on inpatient rehab.  These gains will be of substantial and practical use upon discharge  in facilitating mobility and self-care at the household level. 5. Physiatrist will provide 24 hour management of medical needs as well as oversight of the therapy plan/treatment and provide guidance as appropriate regarding the interaction of the two. 6. The Preadmission Screening has been reviewed and patient status is unchanged unless otherwise stated above. 7. 24 hour rehab nursing will assist with bladder management, bowel management, safety, skin/wound care, disease management, medication administration, pain management and patient education  and help integrate therapy concepts, techniques,education, etc. 8. PT will  assess and treat for/with: Lower extremity strength, range of motion, stamina, balance, functional mobility, safety, adaptive techniques and equipment, pain mgt, family education.   Goals are: mod I to supervision. 9. OT will assess and treat for/with: ADL's, functional mobility, safety, upper extremity strength, adaptive techniques and equipment, pain mgt, family ed, ego support.   Goals are: mod I to supervision. Therapy may proceed with showering this patient. 10. SLP will assess and treat for/with: n/a.  Goals are: n/a. 11. Case Management and Social Worker will assess and treat for psychological issues  and discharge planning. 12. Team conference will be held weekly to assess progress toward goals and to determine barriers to discharge. 13. Patient will receive at least 3 hours of therapy per day at least 5 days per week. 14. ELOS: 7-9 days       15. Prognosis:  excellent     Meredith Staggers, MD, [place] [facility] 08/02/2017  Meredith Staggers, MD 08/02/2017

## 2017-08-02 NOTE — Progress Notes (Signed)
Rehab Admissions Coordinator Note:  Patient was screened by Retta Diones for appropriateness for an Inpatient Acute Rehab Consult.  At this time, we are recommending Inpatient Rehab consult.  Jodell Cipro M 08/02/2017, 2:25 PM  I can be reached at [phone].

## 2017-08-02 NOTE — Discharge Summary (Signed)
Physician Discharge Summary  Charlene Duncan WGY:[id number] DOB: 1936-10-29 DOA: 07/31/2017  PCP: [name], MD  Admit date: 07/31/2017 Discharge date: 08/02/2017  Time spent: 35 minutes  Recommendations for Outpatient Follow-up:  1. Please continue to provide pain control and adjust pain medication regimen accordingly   Discharge Diagnoses:  Principal Problem:   Compression fracture of first lumbar vertebra [facility]) Active Problems:   Congenital hypothyroidism without goiter   Hyperlipidemia   Iron deficiency   Anemia   Hyperglycemia   Discharge Condition: Stable  Diet recommendation: Heart healthy  Filed Weights   08/01/17 0134 08/02/17 0508  Weight: 49.9 kg (110 lb) 49 kg (108 lb)    History of present illness:  The patient is a 81 year old female with history of dementia, do new diagnosis of RA on chronic prednisone, hypothyroidism, who presented after a fall Saturday afternoon. Patient reportedly lives home alone. CT scan in the emergency department reported acute fractures or right pubic rami, mildly comminuted, L1 vertebral compression fracture, patient also has multiple chronic lumbar and lower thoracic compression fractures. Patient was admitted for pain control and placement  Hospital Course:  Fractures after fall - see above. Ortho consulted and no surgery found. Pain control and physical therapy  Otherwise for other known medical conditions prior to admission will continue prior medication regimen listed below  Procedures:  none  Consultations:  Orthopaedic surgery  Discharge Exam: Vitals:   08/02/17 0848 08/02/17 1321  BP: (!) 155/93 (!) 133/97  Pulse: 96 86  Resp:  16  Temp:  98.3 F (36.8 C)  SpO2:  97%    General: Pt in nad, alert and awake Cardiovascular: rrr, no rubs Respiratory: no increased wob, no wheezes  Discharge Instructions   Discharge Instructions    Call MD for:  severe uncontrolled pain    Complete by:  As directed     Call MD for:  temperature >100.4    Complete by:  As directed    Diet - low sodium heart healthy    Complete by:  As directed    Increase activity slowly    Complete by:  As directed      Current Discharge Medication List    START taking these medications   Details  acetaminophen (TYLENOL) 325 MG tablet Take 2 tablets (650 mg total) by mouth every 6 (six) hours as needed for mild pain (or Fever >/= 101).    traMADol (ULTRAM) 50 MG tablet Take 1 tablet (50 mg total) by mouth every 6 (six) hours as needed for severe pain. Qty: 30 tablet, Refills: 0      CONTINUE these medications which have NOT CHANGED   Details  acetaZOLAMIDE (DIAMOX) 250 MG tablet Take 250 mg by mouth as needed (Meniere's).     celecoxib (CELEBREX) 200 MG capsule Take 200 mg by mouth daily as needed for mild pain.     Cholecalciferol (VITAMIN D PO) Take 1 tablet by mouth daily.    DULoxetine (CYMBALTA) 30 MG capsule Take 30 mg by mouth daily.     Iron-FA-B Cmp-C-Biot-Probiotic (FUSION PLUS PO) Take 1 capsule by mouth daily.    levothyroxine (SYNTHROID, LEVOTHROID) 88 MCG tablet Take 88 mcg by mouth every other day.     methotrexate 2.5 MG tablet Take 10 mg by mouth once a week. THURSDAYS    predniSONE (DELTASONE) 10 MG tablet Take 5 mg by mouth daily.     vitamin B-12 (CYANOCOBALAMIN) 500 MCG tablet Take 500 mcg by mouth daily.  donepezil (ARICEPT) 5 MG tablet TAKE 1 TABLET BY MOUTH AT BEDTIME Qty: 30 tablet, Refills: 2       Allergies  Allergen Reactions  . Naproxen Hives      The results of significant diagnostics from this hospitalization (including imaging, microbiology, ancillary and laboratory) are listed below for reference.    Significant Diagnostic Studies: Dg Chest 2 View  Result Date: 08/01/2017 CLINICAL DATA:  Evaluate right basilar lung opacity identified on CT abdomen and pelvis yesterday. Personal history of right breast cancer. EXAM: CHEST  2 VIEW COMPARISON:  Images of the lung  bases from CT abdomen and pelvis yesterday. CT chest 01/15/2016. FINDINGS: AP erect and lateral images were obtained. Cardiac silhouette upper normal in size for AP technique. Thoracic aorta atherosclerotic. Hilar and mediastinal contours otherwise unremarkable. Numerous likely remote right rib fractures with nonunion. Pleuroparenchymal scarring involving the lateral right lung and pleural space, accounting for the opacity on CT. Mildly prominent bronchovascular markings diffusely. Lungs otherwise clear. No pleural effusions. Generalized osseous demineralization. IMPRESSION: 1. Lateral pleuroparenchymal scarring on the right, accounting for the opacity identified on in the CT yesterday. 2.  No acute cardiopulmonary disease. Electronically Signed   By: Evangeline Dakin M.D.   On: 08/01/2017 08:36   Ct Head Wo Contrast  Result Date: 07/31/2017 CLINICAL DATA:  81 year old female status post fall with headache. EXAM: CT HEAD WITHOUT CONTRAST TECHNIQUE: Contiguous axial images were obtained from the base of the skull through the vertex without intravenous contrast. COMPARISON:  Head CT 05/05/2017 and earlier. FINDINGS: Brain: Stable cerebral volume and ventriculomegaly. There is associated chronic mesial temporal lobe and anterior temporal tip volume loss. No midline shift, mass effect, evidence of mass lesion, intracranial hemorrhage or evidence of cortically based acute infarction. Gray-white matter differentiation is within normal limits throughout the brain. No focal encephalomalacia identified. Vascular: Calcified atherosclerosis at the skull base. No suspicious intracranial vascular hyperdensity. Skull: No skull fracture identified. No acute osseous abnormality identified. Sinuses/Orbits: Visualized paranasal sinuses and mastoids are stable and well pneumatized; chronic mild opacification of the inferior right mastoid air cells. Other: No scalp hematoma identified. Negative visible orbit soft tissues and deep  soft tissue spaces of the face. IMPRESSION: 1. No acute intracranial abnormality. No acute traumatic injury identified. 2. Stable chronic ventriculomegaly, perhaps ex vacuo related. Electronically Signed   By: Genevie Ann M.D.   On: 07/31/2017 21:09   Ct Abdomen Pelvis W Contrast  Result Date: 07/31/2017 CLINICAL DATA:  Back pain. Known pelvic fracture after falling out of bed yesterday. EXAM: CT ABDOMEN AND PELVIS WITH CONTRAST TECHNIQUE: Multidetector CT imaging of the abdomen and pelvis was performed using the standard protocol following bolus administration of intravenous contrast. CONTRAST:  172mL ISOVUE-300 IOPAMIDOL (ISOVUE-300) INJECTION 61% COMPARISON:  10/08/2016 FINDINGS: Lower chest: Scarring in the lateral right base. No suspicious focal lung base lesion. No consolidation or effusion. Hepatobiliary: No focal liver abnormality is seen. No gallstones, gallbladder wall thickening, or biliary dilatation. Pancreas: Mildly atrophic with associated ductal dilatation. No pancreatic parenchymal mass. No inflammation or fluid collection. Spleen: Normal in size without focal abnormality. Adrenals/Urinary Tract: Adrenal glands are unremarkable. Kidneys are normal, without renal calculi, focal lesion, or hydronephrosis. Bladder is unremarkable. Stomach/Bowel: Stomach, small bowel and colon are remarkable only for a generous volume of fluid throughout small bowel and a generous volume of colonic stool. No evidence of bowel obstruction. No focal inflammation of bowel. No extraluminal gas. Vascular/Lymphatic: The abdominal aorta is normal in caliber with extensive atherosclerotic  calcification. No evidence of intra-abdominal vascular injury. No adenopathy in the abdomen or pelvis. Reproductive: Uterus and bilateral adnexa are unremarkable. Other: Mildly displaced mildly comminuted superior and inferior right pubic ramus fractures. Visible sacral fracture. No diastases of the sacroiliac joints or pubic symphysis. Small  pelvic sidewall intramuscular hemorrhage, with mild thickening of the right obturator internus. No drainable hematoma. Musculoskeletal: Multiple compressions of upper lumbar and lower thoracic vertebrae. The L1 compression appears to be acute. See accompanying lumbar spine CT for details. No significant focal bone lesions. IMPRESSION: 1. Acute fractures of the right pubic rami, mildly comminuted. No drainable hematoma. No SI or pubic symphysis diastases. 2. Acute L1 vertebral compression. Multiple chronic lumbar and lower thoracic compressions. 3. Pancreatic atrophy.  No pancreatic mass or inflammation. 4. Aortic atherosclerosis. 5. No intra-abdominal vascular injury. No parenchymal organ injury. No peritoneal blood or free air. 6. Irregular opacity in the lateral right lung base, likely scarring although not conclusively characterized. Attention to this area on follow-up is recommended to assure stability. Electronically Signed   By: Andreas Newport M.D.   On: 07/31/2017 21:31   Ct L-spine No Charge  Result Date: 07/31/2017 CLINICAL DATA:  Trauma. Lumbosacral fracture. Fall out of bed today with lumbosacral back pain. EXAM: CT LUMBAR SPINE WITHOUT CONTRAST TECHNIQUE: Multidetector CT imaging of the lumbar spine was performed without intravenous contrast administration. Multiplanar CT image reconstructions were also generated. Imaging reformatted from abdominopelvic CT. COMPARISON:  Lumbar spine radiographs 10/08/2016. Abdominopelvic CT performed concurrently. FINDINGS: Segmentation: 5 lumbar type vertebrae. Alignment: Chronic grade 1 anterolisthesis of L4 on L5. Alignment is otherwise maintained. Vertebrae: Acute compression fracture with approximately 30% loss of height of superior endplate. Minimal extension of posterior cortex without significant retropulsion. No posterior element extension. L2 compression fracture is chronic, however progressed from prior radiograph with increased loss of height centrally  and involving the inferior endplate. Minimal involvement of the posterior cortex without retropulsion. Mild compression fractures superior endplate of U88 and K80 of unknown acuity, but new from 11/30 September 2016 radiograph. No posterior cortex involvement. No focal bone lesion. Paraspinal and other soft tissues: Perispinal musculature is intact without hematoma. Intra-abdominal structures better assessed on dedicated abdominal CT. Disc levels: Disc space narrowing and endplate spurring at K3-K9 and L5-S1, similar in degree to prior exam. Facet arthropathy at L4-L5. No narrowing of the spinal canal. IMPRESSION: 1. Acute L1 compression fracture with approximately 30% loss of height. Minimal posterior cortex involvement without retropulsion. 2. Moderate L2 compression fracture is chronic, but has progressed from comparison radiograph of November 2017. Progressive loss of height centrally with new inferior endplate component. 3. Mild T11 and T12 superior endplate compression fractures, new from prior radiograph but age indeterminate. Electronically Signed   By: Jeb Levering M.D.   On: 07/31/2017 21:45    Microbiology: No results found for this or any previous visit (from the past 240 hour(s)).   Labs: Basic Metabolic Panel:  Recent Labs Lab 07/31/17 1607 08/01/17 0409  NA 135 137  K 3.9 3.6  CL 100* 102  CO2 24 28  GLUCOSE 129* 95  BUN 23* 21*  CREATININE 0.83 0.83  CALCIUM 9.4 8.9   Liver Function Tests:  Recent Labs Lab 07/31/17 1607 08/01/17 0409  AST 31 26  ALT 28 23  ALKPHOS 59 50  BILITOT 0.9 0.7  PROT 6.8 6.0*  ALBUMIN 3.7 3.1*   No results for input(s): LIPASE, AMYLASE in the last 168 hours. No results for input(s): AMMONIA in the  last 168 hours. CBC:  Recent Labs Lab 07/31/17 1607 08/01/17 0409  WBC 10.9* 9.1  HGB 11.7* 10.1*  HCT 36.3 31.8*  MCV 98.6 99.4  PLT 285 262   Cardiac Enzymes: No results for input(s): CKTOTAL, CKMB, CKMBINDEX, TROPONINI in the  last 168 hours. BNP: BNP (last 3 results)  Recent Labs  05/05/17 1325  BNP 188.4*    ProBNP (last 3 results) No results for input(s): PROBNP in the last 8760 hours.  CBG: No results for input(s): GLUCAP in the last 168 hours.     Signed:  [name] MD.  [facility] 08/02/2017, 3:56 PM

## 2017-08-02 NOTE — Progress Notes (Addendum)
Pt slept on and off overnight, no any complain of CP and SOB, tramadol once given for a complain of back pain, vitals stable, pt getting up and using bed side commode, and used actual  scale for weight,will continue to monitor

## 2017-08-02 NOTE — Progress Notes (Signed)
I met with pt at bedside to discuss an inpt rehab admission. She is in agreement. I will contact Dr. Wendee Beavers to clarify if I can admit pt to CIR today. [phone]

## 2017-08-02 NOTE — PMR Pre-admission (Signed)
PMR Admission Coordinator Pre-Admission Assessment  Patient: Charlene Duncan is an 81 y.o., female MRN: [id number] DOB: Nov 18, 1936 Height: 5' (152.4 cm) Weight: 49 kg (108 lb)              Insurance Information HMO:     PPO:      PCP:      IPA:      80/20: yes     OTHER: no HMO PRIMARY: Medicare a and b      Policy#: [id number]      Subscriber: pt Benefits:  Phone #: online     Name: 08/02/2017 Eff. Date: 12/30/2002     Deduct: $1340      Out of Pocket Max: none      Life Max: none CIR: 100%      SNF: 20 full days Outpatient: 80%     Co-Pay: 20% Home Health: 100%      Co-Pay: none DME: 80%     Co-Pay: 20% Providers: pt choice  SECONDARY: [employer]      Policy#: [id number]      Subscriber: pt  Medicaid Application Date:       Case Manager:  Disability Application Date:       Case Worker:   Emergency Contact Information Contact Information    Name Relation Home Work Mobile   [place] Son [phone]     Teti,Theresa Relative [phone]       Current Medical History  Patient Admitting Diagnosis: right pubic rami and L1compression fracture after fall  History of Present Illness: HPI: Charlene Duncan is a 81 y.o. female with dementia, RA who lives with intermittent assistance at the home, who fell at home on  07/31/17 suffering acute fractures of the right puc rami as well as an L1 compression fracture. Imaging was also notable for multiple chronic lumbar and thoracic compression fractures. Pt has struggled with mobility due to pain and lower extremity weakness.   Past Medical History  Past Medical History:  Diagnosis Date  . Alzheimer disease 05/16/2017  . Anemia   . Breast CA ([place])   . Cancer ([facility])    breast  . High cholesterol   . Iron deficiency   . Meniere disease   . Myalgia   . OA (osteoarthritis) of knee   . Thyroid disease    hypothyroid  . Vitamin B 12 deficiency     Family History  family history includes Arthritis in her mother; Cirrhosis in her  brother and sister; Diabetes in her mother.  Prior Rehab/Hospitalizations:  Has the patient had major surgery during 100 days prior to admission? No  Current Medications   Current Facility-Administered Medications:  .  acetaminophen (TYLENOL) tablet 650 mg, 650 mg, Oral, Q6H PRN, 650 mg at 08/02/17 1408 **OR** acetaminophen (TYLENOL) suppository 650 mg, 650 mg, Rectal, Q6H PRN, Jani Gravel, MD .  celecoxib (CELEBREX) capsule 200 mg, 200 mg, Oral, Daily PRN, Jani Gravel, MD .  DULoxetine (CYMBALTA) DR capsule 30 mg, 30 mg, Oral, Daily, Jani Gravel, MD, 30 mg at 08/02/17 0850 .  enoxaparin (LOVENOX) injection 30 mg, 30 mg, Subcutaneous, Q24H, Jani Gravel, MD, 30 mg at 08/02/17 0850 .  levothyroxine (SYNTHROID, LEVOTHROID) tablet 88 mcg, 88 mcg, Oral, Oretha Milch, MD, 88 mcg at 08/01/17 0630 .  predniSONE (DELTASONE) tablet 7.5 mg, 7.5 mg, Oral, Q breakfast, Jani Gravel, MD, 7.5 mg at 08/02/17 0849 .  traMADol (ULTRAM) tablet 50 mg, 50 mg, Oral, Q6H PRN, [name], MD, 50 mg at 08/02/17  5 .  vitamin B-12 (CYANOCOBALAMIN) tablet 500 mcg, 500 mcg, Oral, Daily, Jani Gravel, MD, 500 mcg at 08/02/17 7564  Patients Current Diet: Diet Heart Room service appropriate? Yes; Fluid consistency: Thin Diet - low sodium heart healthy  Precautions / Restrictions Precautions Precautions: Fall Restrictions Weight Bearing Restrictions: Yes RLE Weight Bearing: Weight bearing as tolerated LLE Weight Bearing: Weight bearing as tolerated   Has the patient had 2 or more falls or a fall with injury in the past year?No  Prior Activity Level Limited Community (1-2x/wk): no driving; no AD: has lady 2-3 times per week for housekeeping and to be present when she showers  Home Assistive Devices / [place] Devices/Equipment: Radio producer (specify quad or straight) Home Equipment: Cane - single point  Prior Device Use: Indicate devices/aids used by the patient prior to current illness, exacerbation  or injury? cane  Prior Functional Level Prior Function Level of Independence: Independent with assistive device(s) Comments: uses cane  Self Care: Did the patient need help bathing, dressing, using the toilet or eating?  Needed some help aide three times per week  Indoor Mobility: Did the patient need assistance with walking from room to room (with or without device)? Independent  Stairs: Did the patient need assistance with internal or external stairs (with or without device)? Needed some help  Functional Cognition: Did the patient need help planning regular tasks such as shopping or remembering to take medications? Needed some help  Current Functional Level Cognition  Overall Cognitive Status: History of cognitive impairments - at baseline Orientation Level: Oriented to person, Oriented to place, Disoriented to situation General Comments: Pt with history of dementia, gets upset if confronted with her memory loss, but is very pleasant overall and easily redirected    Extremity Assessment (includes Sensation/Coordination)  Upper Extremity Assessment: Generalized weakness  Lower Extremity Assessment: Generalized weakness    ADLs       Mobility  General bed mobility comments: pt in recliner on arrival    Transfers  Overall transfer level: Needs assistance Equipment used: Rolling walker (2 wheeled) Transfers: Sit to/from Stand Sit to Stand: Min assist Stand pivot transfers: Min assist General transfer comment: pt transfers well, requires min assist for forward weight shift and min verbal cues for hand placement when transferring sit<>stand    Ambulation / Gait / Stairs / Wheelchair Mobility  Ambulation/Gait Ambulation/Gait assistance: Museum/gallery curator (Feet): 50 Feet Assistive device: Rolling walker (2 wheeled) Gait Pattern/deviations: Step-to pattern, Antalgic, Trunk flexed General Gait Details: min verbal cues for walker positioning and use of UEs to reduce  weightbearing in RLE 2/2 pain Gait velocity: decreased Gait velocity interpretation: Below normal speed for age/gender    Posture / Balance Balance Overall balance assessment: Needs assistance, History of Falls Sitting-balance support: Feet supported Sitting balance-Leahy Scale: Fair Standing balance support: Bilateral upper extremity supported Standing balance-Leahy Scale: Poor    Special needs/care consideration BiPAP/CPAP  N/a CPM  N/a Continuous Drip IV n/a Dialysis  N/a Life Vest  N/a Oxygen  N/a Special Bed  N/a Trach Size  N/a Wound Vac n/a Skin intact                            Bowel mgmt: continent LB< 08/01/2017 Bladder mgmt: incontinent Diabetic mgmt  N/a Bed alarm for impulsive with baseline cognitive dysfunction   Previous Home Environment Living Arrangements:  (son, Laverna Peace lives within 7 miles of pt) Available Help at Discharge:  Family, Available PRN/intermittently, Personal care attendant Type of Home: House Home Layout: One level Home Access: Level entry Bathroom Shower/Tub: Tub/shower unit, Curtain, Multimedia programmer: Standard Bathroom Accessibility: Yes How Accessible: Accessible via walker [place]: Yes Type of [place]: [place] (if known): comfort keepers Additional Comments: pt has an attendant 3 days/ wk for 4 hrs. Her son and daughter in law check on her and do the cooking  Discharge Living Setting Plans for Discharge Living Setting: Patient's home, Alone Type of Home at Discharge: House Discharge Home Layout: One level Discharge Home Access: Level entry Discharge Bathroom Shower/Tub: Tub/shower unit, Walk-in shower, Curtain Discharge Bathroom Toilet: Standard Discharge Bathroom Accessibility: Yes How Accessible: Accessible via walker Does the patient have any problems obtaining your medications?: No  Social/Family/Support Systems Patient Roles: Parent (has 3 children) Contact  Information: son, Laverna Peace Anticipated Caregiver: Laverna Peace an dhis wife prn Anticipated Caregiver's Contact Information: see above Ability/Limitations of Caregiver: son and daughter in law work; daughter in law comes dialy in the am to check on pt before she goes to [employer] Caregiver Availability: Intermittent Discharge Plan Discussed with Primary Caregiver: Yes Is Caregiver In Agreement with Plan?: Yes Does Caregiver/Family have Issues with Lodging/Transportation while Pt is in Rehab?: No  Goals/Additional Needs Patient/Family Goal for Rehab: Mod I to superivsion with PT and OT Expected length of stay: ELOS 7- 1o days Equipment Needs: bed alarm and chair alarm for impulsive may nbe needed Pt/Family Agrees to Admission and willing to participate: Yes Program Orientation Provided & Reviewed with Pt/Caregiver Including Roles  & Responsibilities: Yes  Barriers to Discharge: Decreased caregiver support  Decrease burden of Care through IP rehab admission: n/a  Possible need for SNF placement upon discharge: if she does not reach Mod I to intermittent supervision level  Patient Condition: This patient's condition remains as documented in the consult dated 08/02/2017, in which the Rehabilitation Physician determined and documented that the patient's condition is appropriate for intensive rehabilitative care in an inpatient rehabilitation facility. Will admit to inpatient rehab today.  Preadmission Screen Completed By:  Cleatrice Burke, 08/02/2017 4:27 PM ______________________________________________________________________   Discussed status with Dr. Naaman Plummer on 08/02/2017 at  1633 and received telephone approval for admission today.  Admission Coordinator:  Cleatrice Burke, time 7169 Date 08/02/2017

## 2017-08-02 NOTE — Consult Note (Signed)
Physical Medicine and Rehabilitation Consult Reason for Consult:gait difficulties after multiple fractures Referring Physician: Wendee Beavers   HPI: Charlene Duncan is a 81 y.o. female with dementia, RA who lives with intermittent assistance at the home, who fell at home on  07/31/17 suffering acute fractures of the right puc rami as well as an L1 compression fracture. Imaging was also notable for multiple chronic lumbar and thoracic compression fractures. Pt has struggled with mobility due to pain and lower extremity weakness. PM&R was consulted to assess for rehab needs and felt that the patient would benefit from an inpatient rehab admission   Review of Systems  Constitutional: Negative for fever.  HENT: Negative for hearing loss.   Eyes: Negative for blurred vision and double vision.  Respiratory: Negative for cough.   Cardiovascular: Negative for chest pain.  Gastrointestinal: Negative for nausea and vomiting.  Genitourinary: Negative for dysuria.  Musculoskeletal: Positive for back pain, falls, joint pain and myalgias.  Skin: Negative for rash.  Neurological: Negative for dizziness, sensory change, speech change and headaches.  Psychiatric/Behavioral: Negative for suicidal ideas.   Past Medical History:  Diagnosis Date  . Alzheimer disease 05/16/2017  . Anemia   . Breast CA ([place])   . Cancer (HCC)    breast  . High cholesterol   . Iron deficiency   . Meniere disease   . Myalgia   . OA (osteoarthritis) of knee   . Thyroid disease    hypothyroid  . Vitamin B 12 deficiency    Past Surgical History:  Procedure Laterality Date  . BREAST LUMPECTOMY    . CATARACT EXTRACTION     Family History  Problem Relation Age of Onset  . Arthritis Mother   . Diabetes Mother   . Cirrhosis Sister   . Cirrhosis Brother    Social History:  reports that she has never smoked. She has never used smokeless tobacco. She reports that she does not drink alcohol or use drugs. Allergies:    Allergies  Allergen Reactions  . Naproxen Hives   Medications Prior to Admission  Medication Sig Dispense Refill  . acetaZOLAMIDE (DIAMOX) 250 MG tablet Take 250 mg by mouth as needed (Meniere's).     . celecoxib (CELEBREX) 200 MG capsule Take 200 mg by mouth daily as needed for mild pain.     . Cholecalciferol (VITAMIN D PO) Take 1 tablet by mouth daily.    . DULoxetine (CYMBALTA) 30 MG capsule Take 30 mg by mouth daily.     . Iron-FA-B Cmp-C-Biot-Probiotic (FUSION PLUS PO) Take 1 capsule by mouth daily.    [name] levothyroxine (SYNTHROID, LEVOTHROID) 88 MCG tablet Take 88 mcg by mouth every other day.     . methotrexate 2.5 MG tablet Take 10 mg by mouth once a week. THURSDAYS    . predniSONE (DELTASONE) 10 MG tablet Take 5 mg by mouth daily.     . vitamin B-12 (CYANOCOBALAMIN) 500 MCG tablet Take 500 mcg by mouth daily.    [name] donepezil (ARICEPT) 5 MG tablet TAKE 1 TABLET BY MOUTH AT BEDTIME (Patient not taking: Reported on 07/31/2017) 30 tablet 2    Home: [place] expects to be discharged to:: Private residence Living Arrangements: Alone Available Help at Discharge: Family, Available PRN/intermittently, Personal care attendant Type of Home: House Home Access: Level entry Home Layout: One level Home Equipment: Cane - single point Additional Comments: pt has an attendant 3 days/ wk for 4 hrs. Her son and daughter in  law check on her and do the cooking  Functional History: Prior Function Level of Independence: Independent with assistive device(s) Comments: uses cane Functional Status:  Mobility: Bed Mobility General bed mobility comments: pt in recliner on arrival Transfers Overall transfer level: Needs assistance Equipment used: Rolling walker (2 wheeled) Transfers: Sit to/from Stand Sit to Stand: Min assist Stand pivot transfers: Min assist General transfer comment: pt transfers well, requires min assist for forward weight shift and min verbal cues for hand  placement when transferring sit<>stand Ambulation/Gait Ambulation/Gait assistance: Min assist Ambulation Distance (Feet): 50 Feet Assistive device: Rolling walker (2 wheeled) Gait Pattern/deviations: Step-to pattern, Antalgic, Trunk flexed General Gait Details: min verbal cues for walker positioning and use of UEs to reduce weightbearing in RLE 2/2 pain Gait velocity: decreased Gait velocity interpretation: Below normal speed for age/gender    ADL:    Cognition: Cognition Overall Cognitive Status: History of cognitive impairments - at baseline Orientation Level: Oriented to person, Oriented to place, Disoriented to situation Cognition Arousal/Alertness: Awake/alert Behavior During Therapy: Anxious Overall Cognitive Status: History of cognitive impairments - at baseline General Comments: Pt with history of dementia, gets upset if confronted with her memory loss, but is very pleasant overall and easily redirected  Blood pressure (!) 133/97, pulse 86, temperature 98.3 F (36.8 C), temperature source Oral, resp. rate 16, height 5' (1.524 m), weight 49 kg (108 lb), SpO2 97 %. Physical Exam  Constitutional: She appears well-developed.  Eyes: Pupils are equal, round, and reactive to light.  Neck: Normal range of motion.  Cardiovascular: Normal rate.   Respiratory: Effort normal.  GI: She exhibits no distension.  Musculoskeletal: She exhibits tenderness. She exhibits no edema or deformity.  Neurological: She is alert.  Skin: Skin is warm.  Psychiatric: She has a normal mood and affect.    No results found for this or any previous visit (from the past 24 hour(s)). Dg Chest 2 View  Result Date: 08/01/2017 CLINICAL DATA:  Evaluate right basilar lung opacity identified on CT abdomen and pelvis yesterday. Personal history of right breast cancer. EXAM: CHEST  2 VIEW COMPARISON:  Images of the lung bases from CT abdomen and pelvis yesterday. CT chest 01/15/2016. FINDINGS: AP erect and  lateral images were obtained. Cardiac silhouette upper normal in size for AP technique. Thoracic aorta atherosclerotic. Hilar and mediastinal contours otherwise unremarkable. Numerous likely remote right rib fractures with nonunion. Pleuroparenchymal scarring involving the lateral right lung and pleural space, accounting for the opacity on CT. Mildly prominent bronchovascular markings diffusely. Lungs otherwise clear. No pleural effusions. Generalized osseous demineralization. IMPRESSION: 1. Lateral pleuroparenchymal scarring on the right, accounting for the opacity identified on in the CT yesterday. 2.  No acute cardiopulmonary disease. Electronically Signed   By: Evangeline Dakin M.D.   On: 08/01/2017 08:36   Ct Head Wo Contrast  Result Date: 07/31/2017 CLINICAL DATA:  81 year old female status post fall with headache. EXAM: CT HEAD WITHOUT CONTRAST TECHNIQUE: Contiguous axial images were obtained from the base of the skull through the vertex without intravenous contrast. COMPARISON:  Head CT 05/05/2017 and earlier. FINDINGS: Brain: Stable cerebral volume and ventriculomegaly. There is associated chronic mesial temporal lobe and anterior temporal tip volume loss. No midline shift, mass effect, evidence of mass lesion, intracranial hemorrhage or evidence of cortically based acute infarction. Gray-white matter differentiation is within normal limits throughout the brain. No focal encephalomalacia identified. Vascular: Calcified atherosclerosis at the skull base. No suspicious intracranial vascular hyperdensity. Skull: No skull fracture identified. No  acute osseous abnormality identified. Sinuses/Orbits: Visualized paranasal sinuses and mastoids are stable and well pneumatized; chronic mild opacification of the inferior right mastoid air cells. Other: No scalp hematoma identified. Negative visible orbit soft tissues and deep soft tissue spaces of the face. IMPRESSION: 1. No acute intracranial abnormality. No acute  traumatic injury identified. 2. Stable chronic ventriculomegaly, perhaps ex vacuo related. Electronically Signed   By: Genevie Ann M.D.   On: 07/31/2017 21:09   Ct Abdomen Pelvis W Contrast  Result Date: 07/31/2017 CLINICAL DATA:  Back pain. Known pelvic fracture after falling out of bed yesterday. EXAM: CT ABDOMEN AND PELVIS WITH CONTRAST TECHNIQUE: Multidetector CT imaging of the abdomen and pelvis was performed using the standard protocol following bolus administration of intravenous contrast. CONTRAST:  145mL ISOVUE-300 IOPAMIDOL (ISOVUE-300) INJECTION 61% COMPARISON:  10/08/2016 FINDINGS: Lower chest: Scarring in the lateral right base. No suspicious focal lung base lesion. No consolidation or effusion. Hepatobiliary: No focal liver abnormality is seen. No gallstones, gallbladder wall thickening, or biliary dilatation. Pancreas: Mildly atrophic with associated ductal dilatation. No pancreatic parenchymal mass. No inflammation or fluid collection. Spleen: Normal in size without focal abnormality. Adrenals/Urinary Tract: Adrenal glands are unremarkable. Kidneys are normal, without renal calculi, focal lesion, or hydronephrosis. Bladder is unremarkable. Stomach/Bowel: Stomach, small bowel and colon are remarkable only for a generous volume of fluid throughout small bowel and a generous volume of colonic stool. No evidence of bowel obstruction. No focal inflammation of bowel. No extraluminal gas. Vascular/Lymphatic: The abdominal aorta is normal in caliber with extensive atherosclerotic calcification. No evidence of intra-abdominal vascular injury. No adenopathy in the abdomen or pelvis. Reproductive: Uterus and bilateral adnexa are unremarkable. Other: Mildly displaced mildly comminuted superior and inferior right pubic ramus fractures. Visible sacral fracture. No diastases of the sacroiliac joints or pubic symphysis. Small pelvic sidewall intramuscular hemorrhage, with mild thickening of the right obturator  internus. No drainable hematoma. Musculoskeletal: Multiple compressions of upper lumbar and lower thoracic vertebrae. The L1 compression appears to be acute. See accompanying lumbar spine CT for details. No significant focal bone lesions. IMPRESSION: 1. Acute fractures of the right pubic rami, mildly comminuted. No drainable hematoma. No SI or pubic symphysis diastases. 2. Acute L1 vertebral compression. Multiple chronic lumbar and lower thoracic compressions. 3. Pancreatic atrophy.  No pancreatic mass or inflammation. 4. Aortic atherosclerosis. 5. No intra-abdominal vascular injury. No parenchymal organ injury. No peritoneal blood or free air. 6. Irregular opacity in the lateral right lung base, likely scarring although not conclusively characterized. Attention to this area on follow-up is recommended to assure stability. Electronically Signed   By: Andreas Newport M.D.   On: 07/31/2017 21:31   Ct L-spine No Charge  Result Date: 07/31/2017 CLINICAL DATA:  Trauma. Lumbosacral fracture. Fall out of bed today with lumbosacral back pain. EXAM: CT LUMBAR SPINE WITHOUT CONTRAST TECHNIQUE: Multidetector CT imaging of the lumbar spine was performed without intravenous contrast administration. Multiplanar CT image reconstructions were also generated. Imaging reformatted from abdominopelvic CT. COMPARISON:  Lumbar spine radiographs 10/08/2016. Abdominopelvic CT performed concurrently. FINDINGS: Segmentation: 5 lumbar type vertebrae. Alignment: Chronic grade 1 anterolisthesis of L4 on L5. Alignment is otherwise maintained. Vertebrae: Acute compression fracture with approximately 30% loss of height of superior endplate. Minimal extension of posterior cortex without significant retropulsion. No posterior element extension. L2 compression fracture is chronic, however progressed from prior radiograph with increased loss of height centrally and involving the inferior endplate. Minimal involvement of the posterior cortex  without retropulsion. Mild compression fractures  superior endplate of K80 and K34 of unknown acuity, but new from 11/30 September 2016 radiograph. No posterior cortex involvement. No focal bone lesion. Paraspinal and other soft tissues: Perispinal musculature is intact without hematoma. Intra-abdominal structures better assessed on dedicated abdominal CT. Disc levels: Disc space narrowing and endplate spurring at J1-P9 and L5-S1, similar in degree to prior exam. Facet arthropathy at L4-L5. No narrowing of the spinal canal. IMPRESSION: 1. Acute L1 compression fracture with approximately 30% loss of height. Minimal posterior cortex involvement without retropulsion. 2. Moderate L2 compression fracture is chronic, but has progressed from comparison radiograph of November 2017. Progressive loss of height centrally with new inferior endplate component. 3. Mild T11 and T12 superior endplate compression fractures, new from prior radiograph but age indeterminate. Electronically Signed   By: Jeb Levering M.D.   On: 07/31/2017 21:45    Assessment/Plan: Diagnosis: right pubic rami and L1 compression fracture after fall 1. Does the need for close, 24 hr/day medical supervision in concert with the patient's rehab needs make it unreasonable for this patient to be served in a less intensive setting? Yes 2. Co-Morbidities requiring supervision/potential complications: pain mgt, anemia, nutrition, cognition 3. Due to bladder management, bowel management, safety, skin/wound care, disease management, medication administration, pain management and patient education, does the patient require 24 hr/day rehab nursing? Yes 4. Does the patient require coordinated care of a physician, rehab nurse, PT (1-2 hrs/day, 5 days/week) and OT (1-2 hrs/day, 5 days/week) to address physical and functional deficits in the context of the above medical diagnosis(es)? Yes Addressing deficits in the following areas: balance, endurance,  locomotion, strength, transferring, bowel/bladder control, bathing, dressing, feeding, grooming, toileting, cognition and psychosocial support 5. Can the patient actively participate in an intensive therapy program of at least 3 hrs of therapy per day at least 5 days per week? Yes 6. The potential for patient to make measurable gains while on inpatient rehab is excellent 7. Anticipated functional outcomes upon discharge from inpatient rehab are modified independent and supervision  with PT, modified independent and supervision with OT, n/a with SLP. 8. Estimated rehab length of stay to reach the above functional goals is: 7-9 days 9. Anticipated D/C setting: Home 10. Anticipated post D/C treatments: HH therapy and Outpatient therapy 11. Overall Rehab/Functional Prognosis: excellent  RECOMMENDATIONS: This patient's condition is appropriate for continued rehabilitative care in the following setting: CIR Patient has agreed to participate in recommended program. Yes Note that insurance prior authorization may be required for reimbursement for recommended care.  Comment: Rehab Admissions Coordinator to follow up.  Thanks,  Meredith Staggers, MD, Mellody Drown    Meredith Staggers, MD 08/02/2017

## 2017-08-02 NOTE — Progress Notes (Signed)
Physical Therapy Treatment Patient Details Name: Charlene Duncan MRN: [id number] DOB: 04-17-1936 Today's Date: 08/02/2017    History of Present Illness Charlene Duncan  is a 81 y.o. female, w dementia, new diagnosis of RA, hypothyroidism, on prednisone apparently fell at home. Imaging showed compression fx that seems to be new at L1, also has fxs L2, T11, and T12. Also showed minimally displaced pubic ramus fx. Ortho currently opting for non surgical mgmt with [facility].     PT Comments    Pt tolerated session extremely well.  Able to transfer and ambulate with RW with min assist overall. Pt's functional mobility is limited by pain, but able to recall hand placement for transfers with minimal cuing.  Would benefit from f/u with high intensity post-acute rehab to maximize independence and decrease fall risk prior to returning home.  Discussed with pt's son and in agreement.      Follow Up Recommendations  CIR;Supervision/Assistance - 24 hour     Equipment Recommendations  Other (comment) (TBD at next level of care)    Recommendations for Other Services       Precautions / Restrictions Precautions Precautions: Fall Restrictions Weight Bearing Restrictions: Yes RLE Weight Bearing: Weight bearing as tolerated LLE Weight Bearing: Weight bearing as tolerated    Mobility  Bed Mobility               General bed mobility comments: pt in recliner on arrival  Transfers Overall transfer level: Needs assistance Equipment used: Rolling walker (2 wheeled) Transfers: Sit to/from Stand Sit to Stand: Min assist Stand pivot transfers: Min assist       General transfer comment: pt transfers well, requires min assist for forward weight shift and min verbal cues for hand placement when transferring sit<>stand  Ambulation/Gait Ambulation/Gait assistance: Min assist Ambulation Distance (Feet): 50 Feet Assistive device: Rolling walker (2 wheeled) Gait Pattern/deviations: Step-to  pattern;Antalgic;Trunk flexed Gait velocity: decreased Gait velocity interpretation: Below normal speed for age/gender General Gait Details: min verbal cues for walker positioning and use of UEs to reduce weightbearing in RLE 2/2 pain   Stairs            Wheelchair Mobility    Modified Rankin (Stroke Patients Only)          Cognition Arousal/Alertness: Awake/alert Behavior During Therapy: Anxious Overall Cognitive Status: History of cognitive impairments - at baseline                                 General Comments: Pt with history of dementia, gets upset if confronted with her memory loss, but is very pleasant overall and easily redirected             Pertinent Vitals/Pain Pain Assessment: Faces Faces Pain Scale: Hurts a little bit Pain Descriptors / Indicators: Grimacing Pain Intervention(s): Limited activity within patient's tolerance;Monitored during session;Patient requesting pain meds-RN notified           PT Goals (current goals can now be found in the care plan section) Acute Rehab PT Goals Patient Stated Goal: to go home so she can see her dog PT Goal Formulation: With patient/family Time For Goal Achievement: 08/15/17 Potential to Achieve Goals: Good    Frequency    Min 3X/week      PT Plan Discharge plan needs to be updated    Co-evaluation              AM-PAC PT "6 Clicks"  Daily Activity  Outcome Measure  Difficulty turning over in bed (including adjusting bedclothes, sheets and blankets)?: A Lot Difficulty moving from lying on back to sitting on the side of the bed? : A Lot Difficulty sitting down on and standing up from a chair with arms (e.g., wheelchair, bedside commode, etc,.)?: A Little Help needed moving to and from a bed to chair (including a wheelchair)?: A Little Help needed walking in hospital room?: A Little Help needed climbing 3-5 steps with a railing? : A Lot 6 Click Score: 15    End of Session    Activity Tolerance: Patient tolerated treatment well Patient left: in chair;with call bell/phone within reach;with family/visitor present Nurse Communication: Mobility status PT Visit Diagnosis: Unsteadiness on feet (R26.81);Repeated falls (R29.6);Muscle weakness (generalized) (M62.81);Difficulty in walking, not elsewhere classified (R26.2);Pain;Adult, failure to thrive (R62.7)     Time: 1325-1350 PT Time Calculation (min) (ACUTE ONLY): 25 min  Charges:  $Gait Training: 8-22 mins $Therapeutic Activity: 8-22 mins                    G Codes:          Michel Santee, PT, DPT 08/02/2017, 1:59 PM

## 2017-08-02 NOTE — Progress Notes (Signed)
I met with pt at bedside to discuss an inpt rehab admit and then also spoke with her son, Charlene Duncan, by phone. They are both in agreement to admit . I contacted Dr. Wendee Beavers and he is ready to d/c her today. I will make the arrangements to admit today. RN CM and SW made aware.[phone]

## 2017-08-02 NOTE — Clinical Social Work Note (Signed)
Patient has orders to discharge to CIR today.  CSW signing off.   Lyndie Vanderloop, CSW [phone]  

## 2017-08-02 NOTE — NC FL2 (Signed)
MEDICAID FL2 LEVEL OF CARE SCREENING TOOL     IDENTIFICATION  Patient Name: Charlene Duncan Birthdate: [birth year] Sex: female Admission Date (Current Location): 07/31/2017  [facility] and [place] Number:  Herbalist and Address:  [facility] [place] [address], [place], [place] [address]      Provider Number: [id number]  Attending Physician Name and Address:  [name], MD  Relative Name and Phone Number:       Current Level of Care: Hospital Recommended Level of Care: [place] Prior Approval Number:    Date Approved/Denied:   PASRR Number: Manual review  Discharge Plan: SNF    Current Diagnoses: Patient Active Problem List   Diagnosis Date Noted  . Anemia 08/01/2017  . Hyperglycemia 08/01/2017  . Compression fracture of first lumbar vertebra ([place]) 07/31/2017  . Alzheimer disease 05/16/2017  . Arthritis 06/16/2014  . Congenital hypothyroidism without goiter 06/16/2014  . History of breast cancer 06/16/2014  . Hyperlipidemia 06/16/2014  . Iron deficiency 06/16/2014    Orientation RESPIRATION BLADDER Height & Weight     Self, Place  Normal Incontinent Weight: 108 lb (49 kg) Height:  5' (152.4 cm)  BEHAVIORAL SYMPTOMS/MOOD NEUROLOGICAL BOWEL NUTRITION STATUS   (Sundowner's around 5:00 pm per RN, Anxiety.)  (Alzheimer's) Continent Diet (Heart healthy)  AMBULATORY STATUS COMMUNICATION OF NEEDS Skin   Limited Assist Verbally Skin abrasions                       Personal Care Assistance Level of Assistance              Functional Limitations Info  Sight, Hearing, Speech Sight Info: Adequate Hearing Info: Adequate Speech Info: Adequate    SPECIAL CARE FACTORS FREQUENCY  PT (By licensed PT)     PT Frequency: 5 x week              Contractures Contractures Info: Not present    Additional Factors Info  Code Status, Allergies Code Status Info: DNR Allergies Info: Naproxen           Current Medications (08/02/2017):  This is the current hospital active medication list Current Facility-Administered Medications  Medication Dose Route Frequency Provider Last Rate Last Dose  . acetaminophen (TYLENOL) tablet 650 mg  650 mg Oral Q6H PRN Jani Gravel, MD   650 mg at 08/02/17 1408   Or  . acetaminophen (TYLENOL) suppository 650 mg  650 mg Rectal Q6H PRN Jani Gravel, MD      . celecoxib (CELEBREX) capsule 200 mg  200 mg Oral Daily PRN Jani Gravel, MD      . DULoxetine (CYMBALTA) DR capsule 30 mg  30 mg Oral Daily Jani Gravel, MD   30 mg at 08/02/17 0850  . enoxaparin (LOVENOX) injection 30 mg  30 mg Subcutaneous Q24H Jani Gravel, MD   30 mg at 08/02/17 0850  . levothyroxine (SYNTHROID, LEVOTHROID) tablet 88 mcg  88 mcg Oral Oretha Milch, MD   88 mcg at 08/01/17 0630  . predniSONE (DELTASONE) tablet 7.5 mg  7.5 mg Oral Q breakfast Jani Gravel, MD   7.5 mg at 08/02/17 0849  . traMADol (ULTRAM) tablet 50 mg  50 mg Oral Q6H PRN [name], MD   50 mg at 08/02/17 0849  . vitamin B-12 (CYANOCOBALAMIN) tablet 500 mcg  500 mcg Oral Daily Jani Gravel, MD   500 mcg at 08/02/17 0160     Discharge Medications: Please  see discharge summary for a list of discharge medications.  Relevant Imaging Results:  Relevant Lab Results:   Additional Information SS#: [id number]  Candie Chroman, LCSW

## 2017-08-03 ENCOUNTER — Inpatient Hospital Stay (HOSPITAL_COMMUNITY): Payer: Medicare Other | Admitting: Occupational Therapy

## 2017-08-03 ENCOUNTER — Inpatient Hospital Stay (HOSPITAL_COMMUNITY): Payer: Medicare Other | Admitting: Physical Therapy

## 2017-08-03 DIAGNOSIS — S32010D Wedge compression fracture of first lumbar vertebra, subsequent encounter for fracture with routine healing: Secondary | ICD-10-CM

## 2017-08-03 LAB — COMPREHENSIVE METABOLIC PANEL
ALK PHOS: 51 U/L (ref 38–126)
ALT: 19 U/L (ref 14–54)
ANION GAP: 6 (ref 5–15)
AST: 25 U/L (ref 15–41)
Albumin: 3 g/dL — ABNORMAL LOW (ref 3.5–5.0)
BILIRUBIN TOTAL: 0.7 mg/dL (ref 0.3–1.2)
BUN: 23 mg/dL — ABNORMAL HIGH (ref 6–20)
CALCIUM: 8.9 mg/dL (ref 8.9–10.3)
CO2: 28 mmol/L (ref 22–32)
Chloride: 104 mmol/L (ref 101–111)
Creatinine, Ser: 0.96 mg/dL (ref 0.44–1.00)
GFR, EST NON AFRICAN AMERICAN: 55 mL/min — AB (ref >60)
Glucose, Bld: 103 mg/dL — ABNORMAL HIGH (ref 65–99)
Potassium: 3.6 mmol/L (ref 3.5–5.1)
Sodium: 138 mmol/L (ref 135–145)
TOTAL PROTEIN: 5.9 g/dL — AB (ref 6.5–8.1)

## 2017-08-03 LAB — CBC WITH DIFFERENTIAL/PLATELET
BASOS ABS: 0 10*3/uL (ref 0.0–0.1)
Basophils Relative: 0 %
Eosinophils Absolute: 0.1 10*3/uL (ref 0.0–0.7)
Eosinophils Relative: 1 %
HEMATOCRIT: 31.4 % — AB (ref 36.0–46.0)
Hemoglobin: 10 g/dL — ABNORMAL LOW (ref 12.0–15.0)
LYMPHS PCT: 26 %
Lymphs Abs: 2 10*3/uL (ref 0.7–4.0)
MCH: 31.5 pg (ref 26.0–34.0)
MCHC: 31.8 g/dL (ref 30.0–36.0)
MCV: 99.1 fL (ref 78.0–100.0)
Monocytes Absolute: 0.8 10*3/uL (ref 0.1–1.0)
Monocytes Relative: 10 %
NEUTROS ABS: 5 10*3/uL (ref 1.7–7.7)
Neutrophils Relative %: 63 %
PLATELETS: 286 10*3/uL (ref 150–400)
RBC: 3.17 MIL/uL — AB (ref 3.87–5.11)
RDW: 15.7 % — ABNORMAL HIGH (ref 11.5–15.5)
WBC: 7.9 10*3/uL (ref 4.0–10.5)

## 2017-08-03 NOTE — Progress Notes (Signed)
Patient setting off bed alarm half a dozen times or so and standing up beside bed since pm shift began. Tele-sitter ordered and is now in place.

## 2017-08-03 NOTE — Progress Notes (Signed)
Subjective/Complaints: Mainly has Right hip pain , no back pain  ROS:  + constipation no CP, abd pain or breathing issues  Objective: Vital Signs: Blood pressure (!) 164/75, pulse 77, temperature 98.9 F (37.2 C), temperature source Oral, resp. rate 18, height 5' (1.524 m), weight 49.2 kg (108 lb 8 oz), SpO2 97 %. Dg Chest 2 View  Result Date: 08/01/2017 CLINICAL DATA:  Evaluate right basilar lung opacity identified on CT abdomen and pelvis yesterday. Personal history of right breast cancer. EXAM: CHEST  2 VIEW COMPARISON:  Images of the lung bases from CT abdomen and pelvis yesterday. CT chest 01/15/2016. FINDINGS: AP erect and lateral images were obtained. Cardiac silhouette upper normal in size for AP technique. Thoracic aorta atherosclerotic. Hilar and mediastinal contours otherwise unremarkable. Numerous likely remote right rib fractures with nonunion. Pleuroparenchymal scarring involving the lateral right lung and pleural space, accounting for the opacity on CT. Mildly prominent bronchovascular markings diffusely. Lungs otherwise clear. No pleural effusions. Generalized osseous demineralization. IMPRESSION: 1. Lateral pleuroparenchymal scarring on the right, accounting for the opacity identified on in the CT yesterday. 2.  No acute cardiopulmonary disease. Electronically Signed   By: Evangeline Dakin M.D.   On: 08/01/2017 08:36   Results for orders placed or performed during the hospital encounter of 08/02/17 (from the past 72 hour(s))  CBC WITH DIFFERENTIAL     Status: Abnormal   Collection Time: 08/03/17  3:56 AM  Result Value Ref Range   WBC 7.9 4.0 - 10.5 K/uL   RBC 3.17 (L) 3.87 - 5.11 MIL/uL   Hemoglobin 10.0 (L) 12.0 - 15.0 g/dL   HCT 31.4 (L) 36.0 - 46.0 %   MCV 99.1 78.0 - 100.0 fL   MCH 31.5 26.0 - 34.0 pg   MCHC 31.8 30.0 - 36.0 g/dL   RDW 15.7 (H) 11.5 - 15.5 %   Platelets 286 150 - 400 K/uL   Neutrophils Relative % 63 %   Neutro Abs 5.0 1.7 - 7.7 K/uL   Lymphocytes  Relative 26 %   Lymphs Abs 2.0 0.7 - 4.0 K/uL   Monocytes Relative 10 %   Monocytes Absolute 0.8 0.1 - 1.0 K/uL   Eosinophils Relative 1 %   Eosinophils Absolute 0.1 0.0 - 0.7 K/uL   Basophils Relative 0 %   Basophils Absolute 0.0 0.0 - 0.1 K/uL  Comprehensive metabolic panel     Status: Abnormal   Collection Time: 08/03/17  3:56 AM  Result Value Ref Range   Sodium 138 135 - 145 mmol/L   Potassium 3.6 3.5 - 5.1 mmol/L   Chloride 104 101 - 111 mmol/L   CO2 28 22 - 32 mmol/L   Glucose, Bld 103 (H) 65 - 99 mg/dL   BUN 23 (H) 6 - 20 mg/dL   Creatinine, Ser 0.96 0.44 - 1.00 mg/dL   Calcium 8.9 8.9 - 10.3 mg/dL   Total Protein 5.9 (L) 6.5 - 8.1 g/dL   Albumin 3.0 (L) 3.5 - 5.0 g/dL   AST 25 15 - 41 U/L   ALT 19 14 - 54 U/L   Alkaline Phosphatase 51 38 - 126 U/L   Total Bilirubin 0.7 0.3 - 1.2 mg/dL   GFR calc non Af Amer 55 (L) >60 mL/min   GFR calc Af Amer >60 >60 mL/min    Comment: (NOTE) The eGFR has been calculated using the CKD EPI equation. This calculation has not been validated in all clinical situations. eGFR's persistently <60 mL/min signify possible Chronic  Kidney Disease.    Anion gap 6 5 - 15     HEENT: normal Cardio: RRR and no murmur Resp: CTA B/L and unlabored GI: BS positive and NT, ND Extremity:  No Edema Skin:   Intact Neuro: Confused, Abnormal Sensory movement allodynia Right hip and Abnormal Motor 5/5 in Bilateral delt Bi tri grip Left HF, KE, ankle DF Musc/Skel:  Other pain with Right LE movement in Right groin area Gen NAD   Assessment/Plan: 1. Functional deficits secondary to right pubic ramus fracture  which require 3+ hours per day of interdisciplinary therapy in a comprehensive inpatient rehab setting. Physiatrist is providing close team supervision and 24 hour management of active medical problems listed below. Physiatrist and rehab team continue to assess barriers to discharge/monitor patient progress toward functional and medical  goals. FIM:             Function - Chair/bed transfer Chair/bed transfer assist level: Supervision or verbal cues     Function - Comprehension Comprehension: Auditory Comprehension assist level: Follows basic conversation/direction with extra time/assistive device  Function - Expression Expression: Verbal Expression assist level: Expresses basic needs/ideas: With no assist  Function - Social Interaction Social Interaction assist level: Interacts appropriately 90% of the time - Needs monitoring or encouragement for participation or interaction.  Function - Problem Solving Problem solving assist level: Solves basic 90% of the time/requires cueing < 10% of the time  Function - Memory Patient normally able to recall (first 3 days only): That he or she is in a hospital  Medical Problem List and Plan: 1. Functional deficits and gait disordersecondary to right pubic rami fracture, L1 compression fracture -CIR evals 2. DVT Prophylaxis/Anticoagulation: Pharmaceutical: Lovenox'30mg'$  sq daily 3. Pain Management: tylenol and celebrex prn.  -tramadol for more severe pain 4. Mood: cymbalta per home regimen. Mood seems up beat -she's anxious to return home 5. Neuropsych: This patient iscapable of making decisions on herown behalf. -pt has not been using aricept at home 6. Skin/Wound Care: local care, optimize nutrition 7. Fluids/Electrolytes/Nutrition: encourage PO -protein supps -check admission labs 8. RA: continue prednisone per baseline 7.'5mg'$  daily -MTX '10mg'$  weekly on Thursdays 9. Hypothyroid: synthroid 51mg daily  LOS (Days) 1 A FACE TO FACE EVALUATION WAS PERFORMED  Charlene Duncan 08/03/2017, 7:51 AM

## 2017-08-03 NOTE — Evaluation (Signed)
Physical Therapy Assessment and Plan  Patient Details  Name: Charlene Duncan MRN: [id number] Date of Birth: [birth year]  PT Diagnosis: Cognitive deficits, Difficulty walking and Muscle spasms Rehab Potential: Good ELOS: 7-10 days    Today's Date: 08/03/2017 PT Individual Time: 0800-0900 PT Individual Time Calculation (min): 60 min    Problem List:  Patient Active Problem List   Diagnosis Date Noted  . Lumbar compression fracture ([place]) 08/02/2017  . Closed fracture of multiple pubic rami, right, sequela   . Anemia 08/01/2017  . Hyperglycemia 08/01/2017  . Compression fracture of first lumbar vertebra ([place]) 07/31/2017  . Alzheimer disease 05/16/2017  . Arthritis 06/16/2014  . Congenital hypothyroidism without goiter 06/16/2014  . History of breast cancer 06/16/2014  . Hyperlipidemia 06/16/2014  . Iron deficiency 06/16/2014    Past Medical History:  Past Medical History:  Diagnosis Date  . Alzheimer disease 05/16/2017  . Anemia   . Breast CA ([place])   . Cancer (HCC)    breast  . High cholesterol   . Iron deficiency   . Meniere disease   . Myalgia   . OA (osteoarthritis) of knee   . Peripheral edema   . RA (rheumatoid arthritis) (HCC)    Dr. Trudie Reed  . Thyroid disease    hypothyroid  . Vitamin B 12 deficiency    Past Surgical History:  Past Surgical History:  Procedure Laterality Date  . BREAST LUMPECTOMY    . CATARACT EXTRACTION      Assessment & Plan Clinical Impression: Patient is a 81 y.o.femalewith dementia, RA who lives with intermittent assistance at the home, who fell at home on 07/31/17 suffering acute fractures of the right puc rami as well as an L1 compression fracture. Imaging was also notable for multiple chronic lumbar and thoracic compression fractures. Pt has struggled with mobility due to pain and lower extremity weakness.  Patient transferred to CIR on 08/02/2017 .   Patient currently requires min with mobility secondary to muscle weakness, decreased  problem solving, decreased safety awareness, decreased memory and delayed processing and decreased standing balance.  Prior to hospitalization, patient was supervision with mobility and lived with   in a House home.  Home access is  Level entry.  Patient will benefit from skilled PT intervention to maximize safe functional mobility, minimize fall risk and decrease caregiver burden for planned discharge home with intermittent assist.  Anticipate patient will benefit from follow up [facility] at discharge.  PT - End of Session Activity Tolerance: Tolerates 10 - 20 min activity with multiple rests Endurance Deficit: Yes PT Assessment Rehab Potential (ACUTE/IP ONLY): Good PT Barriers to Discharge: Decreased caregiver support;Home environment access/layout;Lack of/limited family support;Behavior PT Barriers to Discharge Comments: Baseline dementia.  PT Patient demonstrates impairments in the following area(s): Balance;Behavior;Endurance;Nutrition;Pain;Safety;Skin Integrity PT Transfers Functional Problem(s): Bed Mobility;Bed to Chair;Furniture;Floor;Car PT Locomotion Functional Problem(s): Ambulation;Wheelchair Mobility;Stairs PT Plan PT Intensity: Minimum of 1-2 x/day ,45 to 90 minutes PT Frequency: 5 out of 7 days PT Duration Estimated Length of Stay: 7-10 days  PT Treatment/Interventions: Ambulation/gait training;Balance/vestibular training;Cognitive remediation/compensation;Community reintegration;Discharge planning;Disease management/prevention;DME/adaptive equipment instruction;Functional mobility training;Pain management;Psychosocial support;Patient/family education;Skin care/wound management;Stair training;Therapeutic Activities;Therapeutic Exercise;UE/LE Strength taining/ROM;UE/LE Coordination activities;Visual/perceptual remediation/compensation;Wheelchair propulsion/positioning PT Transfers Anticipated Outcome(s): Mod I with LRAD  PT Locomotion Anticipated Outcome(s): Supervision assist with LRAD   PT Recommendation Recommendations for Other Services: Therapeutic Recreation consult Therapeutic Recreation Interventions: Stress management;Other (comment) Follow Up Recommendations: Home health PT Patient destination: Home Equipment Recommended: Rolling walker with 5" wheels;Wheelchair cushion (measurements);Wheelchair (measurements)  Skilled  Therapeutic Intervention PT instructed patient in PT Evaluation and initiated treatment intervention; see below for results. PT educated patient in [place], rehab potential, rehab goals, and discharge recommendations.  Patient returned to room and left sitting in recliner with call bell in reach and all needs met.     PT Evaluation Precautions/Restrictions Precautions Precautions: Fall Restrictions Weight Bearing Restrictions: Yes RLE Weight Bearing: Weight bearing as tolerated LLE Weight Bearing: Weight bearing as tolerated General   Vital Signs Pain Pain Assessment Pain Assessment: 0-10 Pain Score: 6  Pain Location: Hip Pain Orientation: Right;Posterior Pain Descriptors / Indicators: Aching Home Living/Prior Functioning Home Living Available Help at Discharge: Family;Available PRN/intermittently;Personal care attendant Type of Home: House Home Access: Level entry Home Layout: One level Bathroom Shower/Tub: Tub/shower unit;Walk-in shower Bathroom Toilet: Standard Additional Comments: pt has an attendant 3 days/ wk for 4 hrs. Her son and daughter in law check on her and do the cooking Prior Function  Able to Take Stairs?: Yes Driving: No Vocation: Retired Comments: uses Systems developer Overall Cognitive Status: History of cognitive impairments - at baseline Orientation Level: Oriented to person;Oriented to place;Disoriented to situation Memory: Impaired Awareness: Impaired Problem Solving: Impaired Safety/Judgment: Impaired Sensation Sensation Light Touch: Appears Intact Proprioception: Appears  Intact Coordination Gross Motor Movements are Fluid and Coordinated: Yes Fine Motor Movements are Fluid and Coordinated: Yes Finger Nose Finger Test: [facility] Motor  Motor Motor: Other (comment) Motor - Skilled Clinical Observations: Generalized weakness  Mobility Bed Mobility Bed Mobility: Rolling Right;Rolling Left;Sit to Supine;Supine to Sit Rolling Right: 4: Min assist Rolling Right Details: Verbal cues for technique;Verbal cues for precautions/safety Rolling Left: 5: Supervision Rolling Left Details: Verbal cues for technique;Verbal cues for precautions/safety Supine to Sit: 3: Mod assist Supine to Sit Details: Verbal cues for precautions/safety;Verbal cues for safe use of DME/AE;Manual facilitation for weight shifting;Manual facilitation for placement;Verbal cues for technique Sit to Supine: 5: Supervision Sit to Supine - Details: Verbal cues for precautions/safety;Verbal cues for technique Transfers Transfers: Yes Sit to Stand: 4: Min assist Sit to Stand Details: Verbal cues for precautions/safety;Verbal cues for safe use of DME/AE Sit to Stand Details (indicate cue type and reason): with RW.   Stand Pivot Transfers: 4: Min assist Stand Pivot Transfer Details: Verbal cues for precautions/safety;Verbal cues for technique;Verbal cues for safe use of DME/AE Stand Pivot Transfer Details (indicate cue type and reason): with RW.  Locomotion  Ambulation Ambulation: Yes Ambulation/Gait Assistance: 4: Min assist Ambulation Distance (Feet): 60 Feet Assistive device: Rolling walker Ambulation/Gait Assistance Details: Verbal cues for technique;Verbal cues for precautions/safety Gait Gait: Yes Gait Pattern: Antalgic;Decreased stance time - right;Decreased step length - right Stairs / Additional Locomotion Stairs: Yes Stairs Assistance: 4: Min assist Stairs Assistance Details: Verbal cues for precautions/safety;Verbal cues for safe use of DME/AE;Verbal cues for gait pattern;Verbal cues  for technique Stair Management Technique: Two rails Number of Stairs: 8 Height of Stairs: 3 Wheelchair Mobility Wheelchair Mobility: Yes Wheelchair Assistance: 5: Investment banker, operational Details: Verbal cues for precautions/safety;Verbal cues for Marketing executive: Both upper extremities Wheelchair Parts Management: Needs assistance Distance: 149f   Trunk/Postural Assessment  Cervical Assessment Cervical Assessment: Exceptions to [facility] ChicagoThoracic Assessment Thoracic Assessment: Exceptions to W[facility] ShoreLumbar Assessment Lumbar Assessment: Exceptions to WKingsboro Psychiatric CenterPostural Control Postural Control: Within Functional Limits  Balance Balance Balance Assessed: Yes Static Sitting Balance Static Sitting - Level of Assistance: 6: Modified independent (Device/Increase time) Dynamic Sitting Balance Dynamic Sitting - Level of Assistance: 5: Stand by assistance Static Standing Balance Static  Standing - Level of Assistance: 4: Min assist;7: Independent (with 1 UE support ) Dynamic Standing Balance Dynamic Standing - Level of Assistance: 4: Min assist (with BUE support ) Extremity Assessment      RLE Assessment RLE Assessment: Exceptions to [facility] (Hip grossly 3/5 due to pain in all planes. knee and ankle 4/5.) LLE Assessment LLE Assessment: Within Functional Limits (grossly 4+/5 proximal to distal)   See Function Navigator for Current Functional Status.   Refer to Care Plan for Long Term Goals  Recommendations for other services: Therapeutic Recreation  Stress management  Discharge Criteria: Patient will be discharged from PT if patient refuses treatment 3 consecutive times without medical reason, if treatment goals not met, if there is a change in medical status, if patient makes no progress towards goals or if patient is discharged from hospital.  The above assessment, treatment plan, treatment alternatives and goals were discussed and mutually agreed upon: by  patient  Lorie Phenix 08/03/2017, 8:59 AM

## 2017-08-03 NOTE — Progress Notes (Signed)
Physical Therapy Session Note  Patient Details  Name: Charlene Duncan MRN: [id number] Date of Birth: March 21, 1936  Today's Date: 08/03/2017 PT Individual Time: 1510-1537 PT Individual Time Calculation (min): 27 min   Short Term Goals: Week 1:  PT Short Term Goal 1 (Week 1): STG =LTG due to ELOS   Skilled Therapeutic Interventions/Progress Updates: Pt presented in recliner with family present agreeable to therapy. Performed sit to stand from recliner min guard with additional time due to pain (premedicated prior to session). Ambulated approx 52ft with RW min guard with decreased gait speed and at time heavy use of BUE on RW. Performed car transfer min guard with additional time and pt using UE to assist RLE into car. Pt ambulated back to room in same manner. Performed gentle seated therex including LAQ, ankle pumps, pillow squeezes, and hip flexion between 12-15 reps ea bilaterally. Pt remained in recliner at end of session with QRB placed, call bell within reach and family present.      Therapy Documentation Precautions:  Precautions Precautions: Fall Restrictions Weight Bearing Restrictions: Yes RLE Weight Bearing: Weight bearing as tolerated LLE Weight Bearing: Weight bearing as tolerated General:   Vital Signs:  Pain: Pain Assessment Pain Assessment: Faces Pain Score: 7  Faces Pain Scale: Hurts whole lot Pain Type: Acute pain Pain Location: Hip Pain Orientation: Right Pain Descriptors / Indicators: Aching Pain Onset: On-going Pain Intervention(s): Medication (See eMAR) Multiple Pain Sites: No PAINAD (Pain Assessment in Advanced Dementia) Breathing: normal   See Function Navigator for Current Functional Status.   Therapy/Group: Individual Therapy  Baraa Tubbs  Dahiana Kulak, PTA  08/03/2017, 3:40 PM

## 2017-08-03 NOTE — Patient Care Conference (Signed)
Inpatient RehabilitationTeam Conference and Plan of Care Update Date: 08/03/2017   Time: 11:30 Am    Patient Name: Charlene Duncan      Medical Record Number: [id number]  Date of Birth: Feb 05, 1936 Sex: Female         Room/Bed: 4M01C/4M01C-01 Payor Info: Payor: MEDICARE / Plan: MEDICARE PART A AND B / Product Type: *No Product type* /    Admitting Diagnosis: pelvic fx  Admit Date/Time:  08/02/2017  6:13 PM Admission Comments: No comment available   Primary Diagnosis:  <principal problem not specified> Principal Problem: <principal problem not specified>  Patient Active Problem List   Diagnosis Date Noted  . Lumbar compression fracture ([place]) 08/02/2017  . Closed fracture of multiple pubic rami, right, sequela   . Anemia 08/01/2017  . Hyperglycemia 08/01/2017  . Compression fracture of first lumbar vertebra ([place]) 07/31/2017  . Alzheimer disease 05/16/2017  . Arthritis 06/16/2014  . Congenital hypothyroidism without goiter 06/16/2014  . History of breast cancer 06/16/2014  . Hyperlipidemia 06/16/2014  . Iron deficiency 06/16/2014    Expected Discharge Date:    Team Members Present: Physician leading conference: Dr. Alysia Penna Social Worker Present: Ovidio Kin, LCSW Nurse Present: Arelia Sneddon, RN PT Present: Dwyane Dee, PT OT Present: Cherylynn Ridges, OT SLP Present: Weston Anna, SLP PPS Coordinator present : Daiva Nakayama, RN, CRRN     Current Status/Progress Goal Weekly Team Focus  Medical     adjusting meds-monitoring pain    medical  stability     Bowel/Bladder   Continent. Uses pad for leaking.  For patient to continue to be continent.  Assist with toileting as needed.   Swallow/Nutrition/ Hydration             ADL's     eval pending        Mobility     eval pending        Communication             Safety/Cognition/ Behavioral Observations            Pain   Occassional hip pain at 5 or 6.  <3.  Treat pain as needed.   Skin   Bruising  in various places.  Maintain skin integrity.  Keep patient from getting up without assisstance.      *See Care Plan and progress notes for long and short-term goals.     Barriers to Discharge  Current Status/Progress Possible Resolutions Date Resolved   Physician                    Nursing  Incontinence               PT  Decreased caregiver support;Home environment access/layout;Lack of/limited family support;Behavior  Baseline dementia.               OT                  SLP                SW                Discharge Planning/Teaching Needs:    Home with added services via private duty aide. Son here today to observe in therapies.     Team Discussion:  Being evaluated today and goals being set. Possibly supervision level for safety. Somewhat confused today with move in rooms and units.  Revisions to Treatment Plan:  New evaluation  [name] 08/03/2017, 2:01 PM

## 2017-08-03 NOTE — Progress Notes (Signed)
Patient information reviewed and entered into eRehab system by Malon Siddall, RN, CRRN, PPS Coordinator.  Information including medical coding and functional independence measure will be reviewed and updated through discharge.     Per nursing patient was given "Data Collection Information Summary for Patients in Inpatient Rehabilitation Facilities with attached "Privacy Act Statement-Health Care Records" upon admission.  

## 2017-08-03 NOTE — Evaluation (Signed)
Occupational Therapy Assessment and Plan  Patient Details  Name: Charlene Duncan MRN: [id number] Date of Birth: 01-Jul-1936  OT Diagnosis: acute pain and muscle weakness (generalized) Rehab Potential: Rehab Potential (ACUTE ONLY): Good ELOS: ~7-9 days   Today's Date: 08/03/2017 OT Individual Time: [phone] OT Individual Time Calculation (min): 60 min     Problem List:  Patient Active Problem List   Diagnosis Date Noted  . Lumbar compression fracture ([place]) 08/02/2017  . Closed fracture of multiple pubic rami, right, sequela   . Anemia 08/01/2017  . Hyperglycemia 08/01/2017  . Compression fracture of first lumbar vertebra ([place]) 07/31/2017  . Alzheimer disease 05/16/2017  . Arthritis 06/16/2014  . Congenital hypothyroidism without goiter 06/16/2014  . History of breast cancer 06/16/2014  . Hyperlipidemia 06/16/2014  . Iron deficiency 06/16/2014    Past Medical History:  Past Medical History:  Diagnosis Date  . Alzheimer disease 05/16/2017  . Anemia   . Breast CA ([place])   . Cancer (HCC)    breast  . High cholesterol   . Iron deficiency   . Meniere disease   . Myalgia   . OA (osteoarthritis) of knee   . Peripheral edema   . RA (rheumatoid arthritis) (HCC)    Dr. Trudie Reed  . Thyroid disease    hypothyroid  . Vitamin B 12 deficiency    Past Surgical History:  Past Surgical History:  Procedure Laterality Date  . BREAST LUMPECTOMY    . CATARACT EXTRACTION      Assessment & Plan Clinical Impression: Patient is a 81 y.o. year old female dementia, RA who lives with intermittent assistance at the home, who fell at home on 07/31/17 suffering acute fractures of the right puc rami as well as an L1 compression fracture. Imaging was also notable for multiple chronic lumbar and thoracic compression fractures. Pt has struggled with mobility due to pain and lower extremity weakness. PM&R was consulted to assess for rehab needs and felt that the patient would benefit from an inpatient  rehab admission.  Patient transferred to CIR on 08/02/2017 .    Patient currently requires mod with basic self-care skills and min A for basic mobiltiy  secondary to muscle weakness, decreased cardiorespiratoy endurance and premorbid decr cognition and decreased standing balance and decreased balance strategies.  Prior to hospitalization, patient could complete ADL with modified independent .  Patient will benefit from skilled intervention to decrease level of assist with basic self-care skills and increase independence with basic self-care skills prior to discharge home with care partner.  Anticipate patient will require 24 hour supervision and follow up home health.  OT - End of Session Activity Tolerance: Tolerates 30+ min activity with multiple rests Endurance Deficit: Yes OT Assessment Rehab Potential (ACUTE ONLY): Good OT Patient demonstrates impairments in the following area(s): Balance;Safety;Cognition;Endurance;Motor;Pain OT Basic ADL's Functional Problem(s): Grooming;Bathing;Dressing;Toileting OT Transfers Functional Problem(s): Toilet;Tub/Shower OT Additional Impairment(s): None OT Plan OT Intensity: Minimum of 1-2 x/day, 45 to 90 minutes OT Frequency: 5 out of 7 days OT Duration/Estimated Length of Stay: ~7-9 days OT Treatment/Interventions: Balance/vestibular training;Discharge planning;Pain management;Self Care/advanced ADL retraining;Therapeutic Activities;UE/LE Coordination activities;Therapeutic Exercise;Skin care/wound managment;Patient/family education;Functional mobility training;Cognitive remediation/compensation;Community reintegration;DME/adaptive equipment instruction;Psychosocial support;UE/LE Strength taining/ROM OT Self Feeding Anticipated Outcome(s): n/a OT Basic Self-Care Anticipated Outcome(s): mod I  OT Toileting Anticipated Outcome(s): mod I  OT Bathroom Transfers Anticipated Outcome(s): mod I  OT Recommendation Recommendations for Other Services: Neuropsych  consult (Should she really be living alone??) Patient destination: Home Follow Up Recommendations: Home health OT  Equipment Recommended: Tub/shower bench Equipment Details: has a shower seat but reports she prefers the tub shower cause it is safer   Skilled Therapeutic Intervention Ot eval initiated with OT goals, purpose and role discussed. Self care retraining at shower level. Pt able to ambulated around room with min A with RW at a slow pace. Pt perform sit to stand from toilet, recliner, regular blue chair and shower chair with supervision to steadying A.  The higher the surface the easier to and decr pain to stand. Pt able to bend down to wash feet but pt with increased pain and unable to thread underwear, & pants and don socks and shoes. Pt definitely displays decr memory (short term and new learning).  Recommend supervision for living at home but anticipate pt to be able to perform functional tasks at mod I level. Pt performed grooming at sink in sitting.  Left pt in recliner with feet up with safety belt donned.  OT Evaluation Precautions/Restrictions  Precautions Precautions: Fall Restrictions Weight Bearing Restrictions: Yes RLE Weight Bearing: Weight bearing as tolerated LLE Weight Bearing: Weight bearing as tolerated General Chart Reviewed: Yes Family/Caregiver Present: No   Pain Pain Assessment Pain Assessment: 0-10 Pain Score: 6  Faces Pain Scale: No hurt Pain Location: Hip Pain Orientation: Right;Posterior Pain Descriptors / Indicators: Aching Home Living/Prior Functioning Home Living Family/patient expects to be discharged to:: Private residence Living Arrangements: Alone Available Help at Discharge: Family, Available PRN/intermittently, Personal care attendant Type of Home: House Home Access: Level entry Home Layout: One level Bathroom Shower/Tub: Tub/shower unit, Health visitor: Standard Bathroom Accessibility: Yes Additional Comments: pt  has an attendant 3 days/ wk for 4 hrs. Her son and daughter in law check on her and do the cooking Prior Function  Able to Take Stairs?: Yes Driving: No Vocation: Retired Comments: uses quad cane ADL ADL ADL Comments: see functional navigator Vision Baseline Vision/History: Wears glasses Wears Glasses: At all times Patient Visual Report: No change from baseline Vision Assessment?: No apparent visual deficits Perception  Perception: Within Functional Limits Praxis Praxis: Intact Cognition Overall Cognitive Status: History of cognitive impairments - at baseline Arousal/Alertness: Awake/alert Orientation Level: Person;Place;Situation Person: Oriented Place: Oriented Situation: Oriented Year: 2018 Month: October Day of Week: Incorrect (Monday) Memory: Impaired Memory Impairment: Decreased recall of new information Immediate Memory Recall: Sock;Blue;Bed Memory Recall:  (0/3 even with cues) Awareness: Impaired Awareness Impairment: Anticipatory impairment Problem Solving: Impaired Problem Solving Impairment: Functional basic Safety/Judgment: Impaired Sensation Sensation Light Touch: Appears Intact Stereognosis: Appears Intact Hot/Cold: Appears Intact Proprioception: Appears Intact Coordination Gross Motor Movements are Fluid and Coordinated: Yes Fine Motor Movements are Fluid and Coordinated: Yes Finger Nose Finger Test: [facility] Motor  Motor Motor: Other (comment) Motor - Skilled Clinical Observations: Generalized weakness and acute pain Mobility  Bed Mobility Bed Mobility: Rolling Right;Rolling Left;Sit to Supine;Supine to Sit Rolling Right: 4: Min assist Rolling Right Details: Verbal cues for technique;Verbal cues for precautions/safety Rolling Left: 5: Supervision Rolling Left Details: Verbal cues for technique;Verbal cues for precautions/safety Supine to Sit: 3: Mod assist Supine to Sit Details: Verbal cues for precautions/safety;Verbal cues for safe use of  DME/AE;Manual facilitation for weight shifting;Manual facilitation for placement;Verbal cues for technique Sit to Supine: 5: Supervision Sit to Supine - Details: Verbal cues for precautions/safety;Verbal cues for technique Transfers Transfers: Sit to Stand;Stand to Sit Sit to Stand: 4: Min assist Sit to Stand Details: Verbal cues for precautions/safety;Verbal cues for safe use of DME/AE Sit to Stand Details (indicate cue  type and reason): with RW.   Stand to Sit: 4: Min assist  Trunk/Postural Assessment  Cervical Assessment Cervical Assessment: Within Functional Limits Thoracic Assessment Thoracic Assessment: Within Functional Limits Lumbar Assessment Lumbar Assessment:  (limited by pain ) Postural Control Postural Control: Within Functional Limits  Balance Balance Balance Assessed: Yes Static Sitting Balance Static Sitting - Level of Assistance: 6: Modified independent (Device/Increase time) Dynamic Sitting Balance Dynamic Sitting - Level of Assistance: 5: Stand by assistance Static Standing Balance Static Standing - Level of Assistance: 4: Min assist Dynamic Standing Balance Dynamic Standing - Level of Assistance: 4: Min assist Extremity/Trunk Assessment RUE Assessment RUE Assessment: Within Functional Limits LUE Assessment LUE Assessment: Within Functional Limits   See Function Navigator for Current Functional Status.   Refer to Care Plan for Long Term Goals  Recommendations for other services: None    Discharge Criteria: Patient will be discharged from OT if patient refuses treatment 3 consecutive times without medical reason, if treatment goals not met, if there is a change in medical status, if patient makes no progress towards goals or if patient is discharged from hospital.  The above assessment, treatment plan, treatment alternatives and goals were discussed and mutually agreed upon: by patient  Nicoletta Ba 08/03/2017, 11:55 AM

## 2017-08-03 NOTE — Progress Notes (Signed)
Social Work Tasheba Henson, Eliezer Champagne Social Worker Signed   Patient Care Conference Date of Service: 08/03/2017  2:01 PM      Hide copied text Hover for attribution information Inpatient RehabilitationTeam Conference and Plan of Care Update Date: 08/03/2017   Time: 11:30 Am      Patient Name: Murline Weigel      Medical Record Number: [id number]  Date of Birth: 1936/06/08 Sex: Female         Room/Bed: 4M01C/4M01C-01 Payor Info: Payor: MEDICARE / Plan: MEDICARE PART A AND B / Product Type: *No Product type* /     Admitting Diagnosis: pelvic fx  Admit Date/Time:  08/02/2017  6:13 PM Admission Comments: No comment available    Primary Diagnosis:  <principal problem not specified> Principal Problem: <principal problem not specified>       Patient Active Problem List    Diagnosis Date Noted  . Lumbar compression fracture ([place]) 08/02/2017  . Closed fracture of multiple pubic rami, right, sequela    . Anemia 08/01/2017  . Hyperglycemia 08/01/2017  . Compression fracture of first lumbar vertebra ([place]) 07/31/2017  . Alzheimer disease 05/16/2017  . Arthritis 06/16/2014  . Congenital hypothyroidism without goiter 06/16/2014  . History of breast cancer 06/16/2014  . Hyperlipidemia 06/16/2014  . Iron deficiency 06/16/2014      Expected Discharge Date:     Team Members Present: Physician leading conference: Dr. Alysia Penna Social Worker Present: Ovidio Kin, LCSW Nurse Present: Arelia Sneddon, RN PT Present: Dwyane Dee, PT OT Present: Cherylynn Ridges, OT SLP Present: Weston Anna, SLP PPS Coordinator present : Daiva Nakayama, RN, CRRN       Current Status/Progress Goal Weekly Team Focus  Medical       adjusting meds-monitoring pain    medical  stability     Bowel/Bladder     Continent. Uses pad for leaking.  For patient to continue to be continent.  Assist with toileting as needed.   Swallow/Nutrition/ Hydration               ADL's       eval pending          Mobility       eval pending        Communication               Safety/Cognition/ Behavioral Observations             Pain     Occassional hip pain at 5 or 6.  <3.  Treat pain as needed.   Skin     Bruising in various places.  Maintain skin integrity.  Keep patient from getting up without assisstance.     *See Care Plan and progress notes for long and short-term goals.      Barriers to Discharge   Current Status/Progress Possible Resolutions Date Resolved   Physician                    Nursing   Incontinence             PT  Decreased caregiver support;Home environment access/layout;Lack of/limited family support;Behavior  Baseline dementia.               OT                 SLP            SW              Discharge Planning/Teaching  Needs:    Home with added services via private duty aide. Son here today to observe in therapies.     Team Discussion:  Being evaluated today and goals being set. Possibly supervision level for safety. Somewhat confused today with move in rooms and units.  Revisions to Treatment Plan:  New evaluation      [name] 08/03/2017, 2:01 PM           Patient ID: Darl Householder, female   DOB: 1936/05/28, 81 y.o.   MRN: [id number]

## 2017-08-03 NOTE — Progress Notes (Signed)
Social Work Assessment and Plan Social Work Assessment and Plan  Patient Details  Name: Charlene Duncan MRN: [id number] Date of Birth: [birth year]  Today's Date: 08/03/2017  Problem List:  Patient Active Problem List   Diagnosis Date Noted  . Lumbar compression fracture ([place]) 08/02/2017  . Closed fracture of multiple pubic rami, right, sequela   . Anemia 08/01/2017  . Hyperglycemia 08/01/2017  . Compression fracture of first lumbar vertebra ([place]) 07/31/2017  . Alzheimer disease 05/16/2017  . Arthritis 06/16/2014  . Congenital hypothyroidism without goiter 06/16/2014  . History of breast cancer 06/16/2014  . Hyperlipidemia 06/16/2014  . Iron deficiency 06/16/2014   Past Medical History:  Past Medical History:  Diagnosis Date  . Alzheimer disease 05/16/2017  . Anemia   . Breast CA ([place])   . Cancer (HCC)    breast  . High cholesterol   . Iron deficiency   . Meniere disease   . Myalgia   . OA (osteoarthritis) of knee   . Peripheral edema   . RA (rheumatoid arthritis) (HCC)    Dr. Trudie Reed  . Thyroid disease    hypothyroid  . Vitamin B 12 deficiency    Past Surgical History:  Past Surgical History:  Procedure Laterality Date  . BREAST LUMPECTOMY    . CATARACT EXTRACTION     Social History:  reports that she has never smoked. She has never used smokeless tobacco. She reports that she does not drink alcohol or use drugs.  Family / Support Systems Marital Status: Widow/Widower Patient Roles: Parent Children: Jimmy-son [phone]  Daughter from [place] coming with her husband to visit for a while Other Supports: Theresa-D-I-L [phone] Anticipated Caregiver: Laverna Peace, daughter in-law and aide Ability/Limitations of Caregiver: SOn and daughter in-law work, Engineer, production hired for three days 4 hrs per day Caregiver Availability: Intermittent Family Dynamics: Close with two of her three children, one son in [place] doesn't visit her. Shehas a few friends who are still living and  provide support. She has church members who are supportive.  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 But will make sure son or daughter is here if any decisions need to be made due to pt's diagnosis of dementia   Abuse/Neglect Physical Abuse: Denies Verbal Abuse: Denies Sexual Abuse: Denies Exploitation of patient/patient's resources: Denies Self-Neglect: Denies  Emotional Status Pt's affect, behavior adn adjustment status: Pt is hurting from her fall but willing to do therapies today. She has no memory of reason why she fell and how. She did call her son and he came over. She can verbalize she fractured her pelvis and back. She wants to be able to do what she was doing before this. Recent Psychosocial Issues: other health issues-dementia has hired aide 3 days for 4 hours to assist for home management Pyschiatric History: No history deferred depression screen due to pt somewhat confused due to change in units and rooms. Will see once adjusts to the new unit she is less confused. She is pleasant though. Substance Abuse History: No issues  Patient / Family Perceptions, Expectations & Goals Pt/Family understanding of illness & functional limitations: Pt has a basic understanding of her fall and fractures. Son can explain her fractures and treatment plan. Although he does not seem  to realize the safety component and maybe needs 24 hr supervision for safety at home now. Premorbid pt/family roles/activities: Mother, retiree, widow, etc Anticipated changes in roles/activities/participation: resume Pt/family expectations/goals: Pt states: " I want to do what I could do  before."  Son states: " We can increase the aide some but she won't want 24 hour."  [employer]: None (Hired aide 3 days 4 hrs) Premorbid Home Care/DME Agencies: None Transportation available at discharge: Family Resource referrals recommended: Support group (specify)  Discharge Planning Living Arrangements: Alone Support Systems: Children, Friends/neighbors Type of Residence: Private residence Insurance underwriter Resources: [employer], Multimedia programmer (specify) Web designer) Financial Resources: [place] Referred: No Living Expenses: Own Money Management: Patient Does the patient have any problems obtaining your medications?: No Home Management: Aide helps with this Patient/Family Preliminary Plans: Return home with increased hours form aide but not 24 hr. Daughter coming from [place] to visit unsure how long she is staying but can be there also. Sw Barriers to Discharge: Lack of/limited family support Sw Barriers to Discharge Comments: Will not have 24 hr supervision Social Work Anticipated Follow Up Needs: HH/OP  Clinical Impression Pleasantly confused patient who is willing to work in therapies, but forgets reason her leg hurts. Son is supportive and here for support and observe in therapies today. Made aware will need more assist from aide When she is discharge, team may recommend 24 hr supervision for safety at home. Will await therapy team evaluations and work on a safe plan. Pt insists on going home and not to another facility.   [name] 08/03/2017, 4:33 PM

## 2017-08-03 NOTE — Care Management Note (Signed)
[facility] Individual Statement of Services  Patient Name:  Charlene Duncan  Date:  08/03/2017  Welcome to the [facility].  Our goal is to provide you with an individualized program based on your diagnosis and situation, designed to meet your specific needs.  With this comprehensive rehabilitation program, you will be expected to participate in at least 3 hours of rehabilitation therapies Monday-Friday, with modified therapy programming on the weekends.  Your rehabilitation program will include the following services:  Physical Therapy (PT), Occupational Therapy (OT), 24 hour per day rehabilitation nursing, Therapeutic Recreaction (TR), Case Management (Social Worker), Rehabilitation Medicine, Nutrition Services and Pharmacy Services  Weekly team conferences will be held on Wednesday to discuss your progress.  Your Social Worker will talk with you frequently to get your input and to update you on team discussions.  Team conferences with you and your family in attendance may also be held.  Expected length of stay: 7-10 days  Overall anticipated outcome: mod/i-supervision level  Depending on your progress and recovery, your program may change. Your Social Worker will coordinate services and will keep you informed of any changes. Your Social Worker's name and contact numbers are listed  below.  The following services may also be recommended but are not provided by the [facility]:    [place] will be made to provide these services after discharge if needed.  Arrangements include referral to agencies that provide these services.  Your insurance has been verified to be:  Medicare & [place] Your primary doctor is:  [name]  Pertinent information will be shared with your doctor and your insurance company.  Social Worker:  Ovidio Kin, [place]  or ([phone]  Information discussed with and copy given to patient by: [name], 08/03/2017, 9:48 AM

## 2017-08-03 NOTE — Progress Notes (Signed)
Occupational Therapy Session Note  Patient Details  Name: Charlene Duncan MRN: [id number] Date of Birth: 11/11/1936  Today's Date: 08/03/2017 OT Individual Time: [id number] OT Individual Time Calculation (min): 56 min   Short Term Goals: Week 1:  OT Short Term Goal 1 (Week 1): STG=LTG  Skilled Therapeutic Interventions/Progress Updates:  Pt greeted seated in recliner with daughter and son-in-law present. Pt introduced OT to family members, then family members left for duration of session. Pt reported need for bathroom, ambulated to the bathroom with min guard A and RW. Pt transferred onto raised commode over toilet and voided bladder successfully. She completed 3/3 toileting steps with min guard A for balance. Pt then completed grooming tasks standing at the sink with set-up A/verbal cues to locate soap on L side and paper towels on R. RN entered room to administer pain meds, then pt ambulated to ortho gym with increased time and min guard A. Pt with increased R hip pain with weight shifting to take steps. Addressed visual scanning and cognition with card matching activity. Pt did well scanning through distractions on cards and locate numbers despite contextual differences including color of number. Addressed standing balance with B UE clothes pin task while reaching outside base of support. Pt needed min guard A for dynamic balance and increased pain with sit<>stands. Pt took rest break, then ambulated back to room w/ RW and min guard with instructional cues for pathfinding to locate room. Pt left seated in recliner with safety belt on and door open by nurses station.    Balance/vestibular training;Discharge planning;Pain management;Self Care/advanced ADL retraining;Therapeutic Activities;UE/LE Coordination activities;Therapeutic Exercise;Skin care/wound managment;Patient/family education;Functional mobility training;Cognitive remediation/compensation;Community reintegration;DME/adaptive equipment  instruction;Psychosocial support;UE/LE Strength taining/ROM   Therapy Documentation Precautions:  Precautions Precautions: Fall Restrictions Weight Bearing Restrictions: Yes RLE Weight Bearing: Weight bearing as tolerated LLE Weight Bearing: Weight bearing as tolerated Pain: Pain Assessment Pain Assessment: Faces Pain Score: 7  Faces Pain Scale: Hurts whole lot Pain Type: Acute pain Pain Location: Hip Pain Orientation: Right Pain Descriptors / Indicators: Aching Pain Onset: On-going Pain Intervention(s): Repositioned Multiple Pain Sites: No PAINAD (Pain Assessment in Advanced Dementia) Breathing: normal ADL: ADL ADL Comments: see functional navigator  See Function Navigator for Current Functional Status.   Therapy/Group: Individual Therapy  Valma Cava 08/03/2017, 3:24 PM

## 2017-08-04 ENCOUNTER — Inpatient Hospital Stay (HOSPITAL_COMMUNITY): Payer: Medicare Other | Admitting: Physical Therapy

## 2017-08-04 ENCOUNTER — Inpatient Hospital Stay (HOSPITAL_COMMUNITY): Payer: Medicare Other | Admitting: Occupational Therapy

## 2017-08-04 MED ORDER — AMLODIPINE BESYLATE 2.5 MG PO TABS
2.5000 mg | ORAL_TABLET | Freq: Every day | ORAL | Status: DC
  Administered 2017-08-04 – 2017-08-10 (×7): 2.5 mg via ORAL
  Filled 2017-08-04 (×7): qty 1

## 2017-08-04 MED ORDER — DONEPEZIL HCL 10 MG PO TABS
10.0000 mg | ORAL_TABLET | Freq: Every day | ORAL | Status: DC
  Administered 2017-08-04 – 2017-08-09 (×6): 10 mg via ORAL
  Filled 2017-08-04 (×7): qty 1

## 2017-08-04 NOTE — Progress Notes (Signed)
Occupational Therapy Session Note  Patient Details  Name: Charlene Duncan MRN: [id number] Date of Birth: 1936/10/24  Today's Date: 08/04/2017 OT Individual Time: 0900-1000 OT Individual Time Calculation (min): 60 min   Short Term Goals: Week 1:  OT Short Term Goal 1 (Week 1): STG=LTG  Skilled Therapeutic Interventions/Progress Updates:    OT treatment session focused on activity tolerance, standing endurance, safety awareness, and pain management within modified bathing/dressing tasks. Pt sat EOB while RN administered meds and OT prepped pt for shower with waterproof dressing over IV. Pt with poor safety awareness attempting to stand without RW. OT educated pt on importance of safety and using RW when standing or ambulating. Pt stood and ambulated to shower with close supervision, increased time, and instructional cues for walk-in shower transfer. Bathing completed with overall set-up A and supervision with increased time. Educated pt on modified dressing techniques by dressing painful R LE first. Pt needed min A overall to don brief and pants. OT then fit pt for TED hose and educated pt on modified strategy for donning using friction reducing devicet left seated in recliner at end of session with nmeeds met and safety belt on.  Therapy Documentation Precautions:  Precautions Precautions: Fall Restrictions Weight Bearing Restrictions: Yes RLE Weight Bearing: Weight bearing as tolerated LLE Weight Bearing: Weight bearing as tolerated Pain: Pain Assessment Pain Assessment: 0-10 Pain Score: 6  Faces Pain Scale: Hurts even more Pain Type: Acute pain Pain Location: Leg Pain Orientation: Right Pain Descriptors / Indicators: Aching Pain Frequency: Intermittent Pain Onset: On-going Pain Intervention(s): Repositioined ADL: ADL ADL Comments: see functional navigator  See Function Navigator for Current Functional Status.   Therapy/Group: Individual Therapy  Valma Cava 08/04/2017,  12:47 PM

## 2017-08-04 NOTE — Progress Notes (Signed)
Physical Therapy Session Note  Patient Details  Name: Charlene Duncan MRN: [id number] Date of Birth: 10-13-1936  Today's Date: 08/04/2017 PT Individual Time: 1100-1200 and 1415-1530 PT Individual Time Calculation (min): 60 min and 75 min (135 minutes total)  Short Term Goals: Week 1:  PT Short Term Goal 1 (Week 1): STG =LTG due to ELOS   Skilled Therapeutic Interventions/Progress Updates:    session 1: no c/o pain at rest, but antalgic gait pattern and grimace with weight bearing.  Session focus on LE strengthening and ROM.  Pt taken to dayroom, completes transfers throughout session with RW and supervision/set up, does not require cues for hand placement or proximity to surface before sitting.  nustep x12 minutes at level 1 for strengthening and ROM to R hip.  Pt returned to room and positioned in recliner while PT switched out w/c for 16x16 with hybrid gel cushion and agility back for improved sitting tolerance when out of bed.  Pt remains in recliner at end of session with call bell in reach and family present.   Session 2: c/o pain at rest ("bad" does not rate), and grimace throughout session.  Session focus on gait training, flexibility, and pain control.  Pt ambulates 2x200' with RW and supervision, min verbal cues for use of UEs to decrease weight bearing through painful RLE.  PT instructs pt in 2x10-12 BLE therex LAQ, bridges, SLR, heel slides, and marching (hook lying position).  PT applied moist heat to pt's lower back/posterior R hip x12 minutes for pain control.  Gait x60' back towards room with continued increase in pain.  Returned to Musician notified of pt's ongoing pain.    Therapy Documentation Precautions:  Precautions Precautions: Fall Restrictions Weight Bearing Restrictions: Yes RLE Weight Bearing: Weight bearing as tolerated LLE Weight Bearing: Weight bearing as tolerated   See Function Navigator for Current Functional Status.   Therapy/Group: Individual  Therapy  Michel Santee 08/04/2017, 3:09 PM

## 2017-08-04 NOTE — IPOC Note (Signed)
Overall Plan of Care [facility]) Patient Details Name: Charlene Duncan MRN: [id number] DOB: 1936-10-27  Admitting Diagnosis: pelvic fx  Hospital Problems: Active Problems:   Lumbar compression fracture ([place])   Closed fracture of multiple pubic rami, right, sequela     Functional Problem List: Nursing Bladder, Bowel, Nutrition, Safety  PT Balance, Behavior, Endurance, Nutrition, Pain, Safety, Skin Integrity  OT Balance, Safety, Cognition, Endurance, Motor, Pain  SLP    TR         Basic ADL's: OT Grooming, Bathing, Dressing, Toileting     Advanced  ADL's: OT       Transfers: PT Bed Mobility, Bed to Chair, Furniture, Floor, Teacher, early years/pre, Metallurgist: PT Ambulation, Emergency planning/management officer, Stairs     Additional Impairments: OT None  SLP        TR      Anticipated Outcomes Item Anticipated Outcome  Self Feeding n/a  Swallowing      Basic self-care  mod I   Toileting  mod I    Bathroom Transfers mod I   Bowel/Bladder   (will remain free of skin breakdown)  Transfers  Mod I with LRAD   Locomotion  Supervision assist with LRAD   Communication     Cognition     Pain   (will remain pain free)  Safety/Judgment   (will remain safe while in rehab)   Therapy Plan: PT Intensity: Minimum of 1-2 x/day ,45 to 90 minutes PT Frequency: 5 out of 7 days PT Duration Estimated Length of Stay: 7-10 days  OT Intensity: Minimum of 1-2 x/day, 45 to 90 minutes OT Frequency: 5 out of 7 days OT Duration/Estimated Length of Stay: ~7-9 days      Team Interventions: Nursing Interventions Patient/Family Education, Bladder Management, Bowel Management, Pain Management, Cognitive Remediation/Compensation  PT interventions Ambulation/gait training, Balance/vestibular training, Cognitive remediation/compensation, Community reintegration, Discharge planning, Disease management/prevention, DME/adaptive equipment instruction, Functional mobility training, Pain  management, Psychosocial support, Patient/family education, Skin care/wound management, Stair training, Therapeutic Activities, Therapeutic Exercise, UE/LE Strength taining/ROM, UE/LE Coordination activities, Visual/perceptual remediation/compensation, Wheelchair propulsion/positioning  OT Interventions Balance/vestibular training, Discharge planning, Pain management, Self Care/advanced ADL retraining, Therapeutic Activities, UE/LE Coordination activities, Therapeutic Exercise, Skin care/wound managment, Patient/family education, Functional mobility training, Cognitive remediation/compensation, Community reintegration, Engineer, drilling, Psychosocial support, UE/LE Strength taining/ROM  SLP Interventions    TR Interventions    SW/CM Interventions Discharge Planning, Psychosocial Support, Patient/Family Education   Barriers to Discharge MD  History of recurrent falls, poor awareness of deficits, moderate cognitive deficits due to pre-existing Alzheimer's dementia  Nursing Incontinence    PT Decreased caregiver support, Home environment access/layout, Lack of/limited family support, Behavior Baseline dementia.   OT      SLP      SW Lack of/limited family support Will not have 24 hr supervision   Team Discharge Planning: Destination: PT-Home ,OT- Home , SLP-  Projected Follow-up: PT-Home health PT, OT-  Home health OT, SLP-  Projected Equipment Needs: PT-Rolling walker with 5" wheels, Wheelchair cushion (measurements), Wheelchair (measurements), OT- Tub/shower bench, SLP-  Equipment Details: PT- , OT-has a shower seat but reports she prefers the tub shower cause it is safer Patient/family involved in discharge planning: PT- Patient,  OT-Patient, SLP-   MD ELOS: 7-10d Medical Rehab Prognosis:  Good Assessment:   81 y.o.femalewith dementia, RA who lives with intermittent assistance at the home, who fell at home on 07/31/17 suffering acute fractures of the right puc rami as  well as an L1 compression fracture. Imaging was also notable for multiple chronic lumbar and thoracic compression fractures. Pt has struggled with mobility due to pain and lower extremity weakness. PM&R was consulted to assess for rehab needs and felt that the patient would benefit from an inpatient rehab admission. Pt is admitted today to maximize functional gains   Now requiring 24/7 Rehab RN,MD, as well as CIR level PT, OT and SLP.  Treatment team will focus on ADLs and mobility with goals set at ModI/Sup See Team Conference Notes for weekly updates to the plan of care

## 2017-08-04 NOTE — Progress Notes (Signed)
Subjective/Complaints:  No issues overnite- does not remember me, needs calendar for day and date, oriented to [facility] ROS:  + constipation no CP, abd pain or breathing issues  Objective: Vital Signs: Blood pressure (!) 182/87, pulse 86, temperature 98.3 F (36.8 C), temperature source Oral, resp. rate 20, height 5' (1.524 m), weight 49.2 kg (108 lb 8 oz), SpO2 97 %. No results found. Results for orders placed or performed during the hospital encounter of 08/02/17 (from the past 72 hour(s))  CBC WITH DIFFERENTIAL     Status: Abnormal   Collection Time: 08/03/17  3:56 AM  Result Value Ref Range   WBC 7.9 4.0 - 10.5 K/uL   RBC 3.17 (L) 3.87 - 5.11 MIL/uL   Hemoglobin 10.0 (L) 12.0 - 15.0 g/dL   HCT 13.6 (L) 53.1 - 13.9 %   MCV 99.1 78.0 - 100.0 fL   MCH 31.5 26.0 - 34.0 pg   MCHC 31.8 30.0 - 36.0 g/dL   RDW 49.1 (H) 48.6 - 54.6 %   Platelets 286 150 - 400 K/uL   Neutrophils Relative % 63 %   Neutro Abs 5.0 1.7 - 7.7 K/uL   Lymphocytes Relative 26 %   Lymphs Abs 2.0 0.7 - 4.0 K/uL   Monocytes Relative 10 %   Monocytes Absolute 0.8 0.1 - 1.0 K/uL   Eosinophils Relative 1 %   Eosinophils Absolute 0.1 0.0 - 0.7 K/uL   Basophils Relative 0 %   Basophils Absolute 0.0 0.0 - 0.1 K/uL  Comprehensive metabolic panel     Status: Abnormal   Collection Time: 08/03/17  3:56 AM  Result Value Ref Range   Sodium 138 135 - 145 mmol/L   Potassium 3.6 3.5 - 5.1 mmol/L   Chloride 104 101 - 111 mmol/L   CO2 28 22 - 32 mmol/L   Glucose, Bld 103 (H) 65 - 99 mg/dL   BUN 23 (H) 6 - 20 mg/dL   Creatinine, Ser 8.98 0.44 - 1.00 mg/dL   Calcium 8.9 8.9 - 38.9 mg/dL   Total Protein 5.9 (L) 6.5 - 8.1 g/dL   Albumin 3.0 (L) 3.5 - 5.0 g/dL   AST 25 15 - 41 U/L   ALT 19 14 - 54 U/L   Alkaline Phosphatase 51 38 - 126 U/L   Total Bilirubin 0.7 0.3 - 1.2 mg/dL   GFR calc non Af Amer 55 (L) >60 mL/min   GFR calc Af Amer >60 >60 mL/min    Comment: (NOTE) The eGFR has been calculated using the CKD EPI  equation. This calculation has not been validated in all clinical situations. eGFR's persistently <60 mL/min signify possible Chronic Kidney Disease.    Anion gap 6 5 - 15     HEENT: normal Cardio: RRR and no murmur Resp: CTA B/L and unlabored GI: BS positive and NT, ND Extremity:  No Edema Skin:   Intact Neuro: Confused, Abnormal Sensory movement allodynia Right hip and Abnormal Motor 5/5 in Bilateral delt Bi tri grip Left HF, KE, ankle DF Musc/Skel:  Other pain with Right LE movement in Right groin area Gen NAD   Assessment/Plan: 1. Functional deficits secondary to right pubic ramus fracture  which require 3+ hours per day of interdisciplinary therapy in a comprehensive inpatient rehab setting. Physiatrist is providing close team supervision and 24 hour management of active medical problems listed below. Physiatrist and rehab team continue to assess barriers to discharge/monitor patient progress toward functional and medical goals. FIM: Function - Bathing Position:  Shower Body parts bathed by patient: Right arm, Left arm, Chest, Abdomen, Front perineal area, Buttocks, Right upper leg, Left upper leg, Right lower leg, Left lower leg Body parts bathed by helper: Back Assist Level: Touching or steadying assistance(Pt > 75%)  Function- Upper Body Dressing/Undressing What is the patient wearing?: Pull over shirt/dress Pull over shirt/dress - Perfomed by patient: Thread/unthread right sleeve, Thread/unthread left sleeve, Put head through opening, Pull shirt over trunk Assist Level: Set up Function - Lower Body Dressing/Undressing What is the patient wearing?: Underwear, Pants, Socks, Shoes Position: Wheelchair/chair at [employer] - Performed by patient: Pull underwear up/down Underwear - Performed by helper: Thread/unthread right underwear leg, Thread/unthread left underwear leg Pants- Performed by patient: Pull pants up/down Pants- Performed by helper: Thread/unthread right  pants leg, Thread/unthread left pants leg Socks - Performed by helper: Don/doff right sock, Don/doff left sock Shoes - Performed by helper: Don/doff right shoe, Don/doff left shoe, Fasten right, Fasten left Assist for footwear: Partial/moderate assist Assist for lower body dressing: Touching or steadying assistance (Pt > 75%)  Function - Toileting Toileting steps completed by patient: Adjust clothing prior to toileting, Performs perineal hygiene, Adjust clothing after toileting Toileting Assistive Devices: Grab bar or rail Assist level: Supervision or verbal cues  Function - Air cabin crew transfer assistive device: Elevated toilet seat/BSC over toilet Assist level to toilet: Touching or steadying assistance (Pt > 75%) Assist level from toilet: Touching or steadying assistance (Pt > 75%)  Function - Chair/bed transfer Chair/bed transfer method: Ambulatory Chair/bed transfer assist level: Supervision or verbal cues Chair/bed transfer assistive device: Armrests, Environmental consultant  Function - Locomotion: Wheelchair Type: Manual Max wheelchair distance: 120 Assist Level: Supervision or verbal cues Assist Level: Supervision or verbal cues Wheel 150 feet activity did not occur: Safety/medical concerns Turns around,maneuvers to table,bed, and toilet,negotiates 3% grade,maneuvers on rugs and over doorsills: No Function - Locomotion: Ambulation Assistive device: Walker-rolling Max distance: 20f Assist level: Touching or steadying assistance (Pt > 75%) Assist level: Touching or steadying assistance (Pt > 75%) Assist level: Touching or steadying assistance (Pt > 75%) Walk 150 feet activity did not occur: Safety/medical concerns Walk 10 feet on uneven surfaces activity did not occur: Safety/medical concerns  Function - Comprehension Comprehension: Auditory Comprehension assistive device: Hearing aids Comprehension assist level: Understands basic 90% of the time/cues < 10% of the  time  Function - Expression Expression: Verbal Expression assist level: Expresses basic needs/ideas: With extra time/assistive device  Function - Social Interaction Social Interaction assist level: Interacts appropriately 90% of the time - Needs monitoring or encouragement for participation or interaction.  Function - Problem Solving Problem solving assist level: Solves basic 50 - 74% of the time/requires cueing 25 - 49% of the time  Function - Memory Memory assist level: Recognizes or recalls 50 - 74% of the time/requires cueing 25 - 49% of the time Patient normally able to recall (first 3 days only): That he or she is in a hospital  Medical Problem List and Plan: 1. Functional deficits and gait disordersecondary to right pubic rami fracture, L1 compression fracture -CIR PT, OT, SLP- Alzheimers dementia will impact learning- was on Aricept at home will restart 2. DVT Prophylaxis/Anticoagulation: Pharmaceutical: Lovenox'30mg'$  sq daily 3. Pain Management: tylenol and celebrex prn.  -tramadol for more severe pain 4. Mood: cymbalta per home regimen. Mood seems up beat -she's anxious to return home 5. Neuropsych: This patient iscapable of making decisions on herown behalf. -pt has not been using aricept at home 6.  Skin/Wound Care: local care, optimize nutrition 7. Fluids/Electrolytes/Nutrition: encourage PO -protein supps -check admission labs 8. RA: continue prednisone per baseline 7.'5mg'$  daily -MTX '10mg'$  weekly on Thursdays 9. Hypothyroid: synthroid 58mg daily 10.  Hypertension- will start low dose amlodipine 2.'5mg'$  11.  Hypoalb- start prostat LOS (Days) 2 A FACE TO FACE EVALUATION WAS PERFORMED  Charlene Duncan 08/04/2017, 7:25 AM

## 2017-08-05 ENCOUNTER — Inpatient Hospital Stay (HOSPITAL_COMMUNITY): Payer: Medicare Other | Admitting: Occupational Therapy

## 2017-08-05 ENCOUNTER — Inpatient Hospital Stay (HOSPITAL_COMMUNITY): Payer: Medicare Other | Admitting: *Deleted

## 2017-08-05 NOTE — Progress Notes (Signed)
Occupational Therapy Session Note  Patient Details  Name: Charlene Duncan MRN: [id number] Date of Birth: Sep 16, 1936  Today's Date: 08/05/2017 OT Individual Time: 1030-1155 OT Individual Time Calculation (min): 85 min    Short Term Goals: No short term goals set  Skilled Therapeutic Interventions/Progress Updates:    Pt seen this session to focus on standing balance and tolerance. Pt received in recliner and needed cues to recall her prior OT session this am.  She demonstrated safe sit to stand without cues to RW.  Ambulated to sink for oral care, then to toilet and then back to sink to put on makeup.  Pt focused on task and participated well until her family arrived. Pt became quite distracted and irritated that she had to do therapy as she wanted to visit with family.  Explained family could continue to visit she just needed to participate.  Family encouraging her to continue the treatment. The next 60 minutes she was needing max encouragement to keep participating.  Worked on activities directly related to her home tasks in ADL kitchen.  She practiced reaching to the floor to pick up her "dog bowl" from the floor carry it to pantry to get "dog food", then placing it back down to floor. Close S with reaching to floor and no reports of pain.  She practiced reaching into refrigerator to get her "lean cuisine" that she usually has for lunch and placing it in microwave.  Pt was frustrated that she was practicing the tasks, stating that she already knows how to do these things.  Reeducation on purpose of activity, but pt continually distracted about wanting to spend time with family.  Negotiated with pt to continue therapy and then she could get done 5 min early.  Ambulated with RW with close S to gym. She worked on theraband and dowel exercises for UE strength and postural strength.  Pt ambulated back to room and sat in her recliner. Family providing S until next therapy session, so did not place quick  release belt.  Therapy Documentation Precautions:  Precautions Precautions: Fall Restrictions Weight Bearing Restrictions: Yes RLE Weight Bearing: Weight bearing as tolerated LLE Weight Bearing: Weight bearing as tolerated    Vital Signs: Therapy Vitals Temp: 98.2 F (36.8 C) Temp Source: Oral Pulse Rate: 76 BP: (!) 148/79 Patient Position (if appropriate): Lying Oxygen Therapy SpO2: 93 % O2 Device: Not Delivered Pain: Pain Assessment Pain Score: 2  - began to have back pain towards the end of the session from ambulation  ADL: ADL ADL Comments: see functional navigator  See Function Navigator for Current Functional Status.   Therapy/Group: Individual Therapy  [place] 08/05/2017, 8:51 AM

## 2017-08-05 NOTE — Progress Notes (Signed)
Physical Therapy Session Note  Patient Details  Name: Charlene Duncan MRN: [id number] Date of Birth: [birth year]  Today's Date: 08/05/2017 PT Individual Time: 1415-1500 PT Individual Time Calculation (min): 45 min   Short Term Goals: Week 1:  PT Short Term Goal 1 (Week 1): STG =LTG due to ELOS  Week 2:     Skilled Therapeutic Interventions/Progress Updates:  Tx focused on functional mobility traniing, threex for strengthening and ROM, and activity tolerance.  SIt<>stand with S and safety cues to RW. Increased time required. Gait in controlled setting 2x200' with close S and RW.  Sit<>supine with S and increased time/effort.  Supine therex: ankle pumps, RLE heel slides, and hip ABD/ADD x10 each.  Standing balance during functoinal pill sorting task with no UEs upport x3 min with S.  Standing therex included each of the following with bil UE support x1: heel raises, R hip ABD, R HS curl, mini-squats with no rest break needed Moist heat for pain relief x5 min. 10/10 pain by end of tx. Modified positioning, RN made aware and bringing medication.  Pt may benefit from TUG test for fall risk assessment.      Therapy Documentation Precautions:  Precautions Precautions: Fall Restrictions Weight Bearing Restrictions: Yes RLE Weight Bearing: Weight bearing as tolerated LLE Weight Bearing: Weight bearing as tolerated   See Function Navigator for Current Functional Status.   Therapy/Group: Individual Therapy  Kennieth Rad, PT, DPT  Dwaine Deter, Landry Mellow M 08/05/2017, 2:41 PM

## 2017-08-05 NOTE — Progress Notes (Signed)
Occupational Therapy Session Note  Patient Details  Name: Charlene Duncan MRN: [id number] Date of Birth: 23-Dec-1935  Today's Date: 08/05/2017 OT Individual Time: 0800-0900 OT Individual Time Calculation (min): 60 min   Short Term Goals: Week 1:  OT Short Term Goal 1 (Week 1): STG=LTG  Skilled Therapeutic Interventions/Progress Updates:    Pt greeted in bed finishing breakfast without assistance. Pt seemingly more anxious and restless this am, looking for papers the doctor gave her. Redirected pt to complete shower level BADLs. Pt transferred OOB with supervision. She then ambulated to bathroom with RW and instructional cues to recall why she was going into the bathroom (to shower). VC for safe technique to doff clothing in sitting 2/2 balance deficits. Pt completed all bathing/dressing tasks with supervision and verbal cues to recall modified dressing strategies 2/2 R LE pain. Pt tolerated multiple sit<>stands with supervision. She also tolerated figure 4 position today to don shoes and fasten them without assistance. Pt left seated in recliner at end of session with safety belt on and needs met.   Therapy Documentation Precautions:  Precautions Precautions: Fall Restrictions Weight Bearing Restrictions: Yes RLE Weight Bearing: Weight bearing as tolerated LLE Weight Bearing: Weight bearing as tolerated   Therapy/Group: Individual Therapy  Valma Cava 08/05/2017, 8:47 AM

## 2017-08-05 NOTE — Progress Notes (Signed)
Subjective/Complaints:  Tried to get up without assist last noc no falls  ROS:  + constipation no CP, abd pain or breathing issues  Objective: Vital Signs: Blood pressure (!) 148/79, pulse 76, temperature 98.2 F (36.8 C), temperature source Oral, resp. rate 18, height 5' (1.524 m), weight 49.2 kg (108 lb 8 oz), SpO2 93 %. No results found. Results for orders placed or performed during the hospital encounter of 08/02/17 (from the past 72 hour(s))  CBC WITH DIFFERENTIAL     Status: Abnormal   Collection Time: 08/03/17  3:56 AM  Result Value Ref Range   WBC 7.9 4.0 - 10.5 K/uL   RBC 3.17 (L) 3.87 - 5.11 MIL/uL   Hemoglobin 10.0 (L) 12.0 - 15.0 g/dL   HCT 23.3 (L) 00.7 - 62.2 %   MCV 99.1 78.0 - 100.0 fL   MCH 31.5 26.0 - 34.0 pg   MCHC 31.8 30.0 - 36.0 g/dL   RDW 63.3 (H) 35.4 - 56.2 %   Platelets 286 150 - 400 K/uL   Neutrophils Relative % 63 %   Neutro Abs 5.0 1.7 - 7.7 K/uL   Lymphocytes Relative 26 %   Lymphs Abs 2.0 0.7 - 4.0 K/uL   Monocytes Relative 10 %   Monocytes Absolute 0.8 0.1 - 1.0 K/uL   Eosinophils Relative 1 %   Eosinophils Absolute 0.1 0.0 - 0.7 K/uL   Basophils Relative 0 %   Basophils Absolute 0.0 0.0 - 0.1 K/uL  Comprehensive metabolic panel     Status: Abnormal   Collection Time: 08/03/17  3:56 AM  Result Value Ref Range   Sodium 138 135 - 145 mmol/L   Potassium 3.6 3.5 - 5.1 mmol/L   Chloride 104 101 - 111 mmol/L   CO2 28 22 - 32 mmol/L   Glucose, Bld 103 (H) 65 - 99 mg/dL   BUN 23 (H) 6 - 20 mg/dL   Creatinine, Ser 5.63 0.44 - 1.00 mg/dL   Calcium 8.9 8.9 - 89.3 mg/dL   Total Protein 5.9 (L) 6.5 - 8.1 g/dL   Albumin 3.0 (L) 3.5 - 5.0 g/dL   AST 25 15 - 41 U/L   ALT 19 14 - 54 U/L   Alkaline Phosphatase 51 38 - 126 U/L   Total Bilirubin 0.7 0.3 - 1.2 mg/dL   GFR calc non Af Amer 55 (L) >60 mL/min   GFR calc Af Amer >60 >60 mL/min    Comment: (NOTE) The eGFR has been calculated using the CKD EPI equation. This calculation has not been  validated in all clinical situations. eGFR's persistently <60 mL/min signify possible Chronic Kidney Disease.    Anion gap 6 5 - 15     HEENT: normal Cardio: RRR and no murmur Resp: CTA B/L and unlabored GI: BS positive and NT, ND Extremity:  No Edema Skin:   Intact Neuro: Confused, Abnormal Sensory movement allodynia Right hip and Abnormal Motor 5/5 in Bilateral delt Bi tri grip Left HF, KE, ankle DF 4- Right HF/KE limited by pain, 5/5 in R ankle DF/PF Musc/Skel:  Other pain with Right LE movement in Right groin area Gen NAD   Assessment/Plan: 1. Functional deficits secondary to right pubic ramus fracture  which require 3+ hours per day of interdisciplinary therapy in a comprehensive inpatient rehab setting. Physiatrist is providing close team supervision and 24 hour management of active medical problems listed below. Physiatrist and rehab team continue to assess barriers to discharge/monitor patient progress toward functional and medical  goals. FIM: Function - Bathing Position: Shower Body parts bathed by patient: Right arm, Left arm, Chest, Abdomen, Front perineal area, Buttocks, Right upper leg, Left upper leg, Right lower leg, Left lower leg Body parts bathed by helper: Back Assist Level: Touching or steadying assistance(Pt > 75%)  Function- Upper Body Dressing/Undressing What is the patient wearing?: Pull over shirt/dress Pull over shirt/dress - Perfomed by patient: Thread/unthread right sleeve, Thread/unthread left sleeve, Put head through opening, Pull shirt over trunk Assist Level: Set up Function - Lower Body Dressing/Undressing What is the patient wearing?: Underwear, Pants, Shoes, Ted Hose Position: Wheelchair/chair at [employer] - Performed by patient: Pull underwear up/down Underwear - Performed by helper: Thread/unthread right underwear leg, Thread/unthread left underwear leg Pants- Performed by patient: Pull pants up/down Pants- Performed by helper:  Thread/unthread left pants leg, Thread/unthread right pants leg Socks - Performed by helper: Don/doff right sock, Don/doff left sock Shoes - Performed by helper: Don/doff right shoe, Don/doff left shoe, Fasten left, Fasten right TED Hose - Performed by helper: Don/doff left TED hose, Don/doff right TED hose Assist for footwear: Partial/moderate assist Assist for lower body dressing: Touching or steadying assistance (Pt > 75%)  Function - Toileting Toileting steps completed by patient: Adjust clothing prior to toileting, Performs perineal hygiene, Adjust clothing after toileting Toileting Assistive Devices: Grab bar or rail Assist level: Supervision or verbal cues  Function Midwife transfer assistive device: Elevated toilet seat/BSC over toilet Assist level to toilet: Supervision or verbal cues Assist level from toilet: Supervision or verbal cues  Function - Chair/bed transfer Chair/bed transfer method: Ambulatory Chair/bed transfer assist level: Supervision or verbal cues Chair/bed transfer assistive device: Armrests, Printmaker - Locomotion: Wheelchair Will patient use wheelchair at discharge?: No Type: Manual Max wheelchair distance: 120 Assist Level: Supervision or verbal cues Assist Level: Supervision or verbal cues Wheel 150 feet activity did not occur: Safety/medical concerns Turns around,maneuvers to table,bed, and toilet,negotiates 3% grade,maneuvers on rugs and over doorsills: No Function - Locomotion: Ambulation Assistive device: Walker-rolling Max distance: 200 Assist level: Supervision or verbal cues Assist level: Supervision or verbal cues Assist level: Supervision or verbal cues Walk 150 feet activity did not occur: Safety/medical concerns Assist level: Supervision or verbal cues Walk 10 feet on uneven surfaces activity did not occur: Safety/medical concerns  Function - Comprehension Comprehension: Auditory Comprehension assistive  device: Hearing aids Comprehension assist level: Understands basic 90% of the time/cues < 10% of the time  Function - Expression Expression: Verbal Expression assist level: Expresses basic needs/ideas: With extra time/assistive device  Function - Social Interaction Social Interaction assist level: Interacts appropriately 75 - 89% of the time - Needs redirection for appropriate language or to initiate interaction.  Function - Problem Solving Problem solving assist level: Solves basic 50 - 74% of the time/requires cueing 25 - 49% of the time  Function - Memory Memory assist level: Recognizes or recalls 50 - 74% of the time/requires cueing 25 - 49% of the time Patient normally able to recall (first 3 days only): None of the above  Medical Problem List and Plan: 1. Functional deficits and gait disordersecondary to right pubic rami fracture, L1 compression fracture -CIR PT, OT, SLP- Alzheimers dementia will impact learning-  2. DVT Prophylaxis/Anticoagulation: Pharmaceutical: Lovenox'30mg'$  sq daily 3. Pain Management: tylenol and celebrex prn.  -tramadol for more severe pain 4. Mood: cymbalta per home regimen. Mood seems up beat -she's anxious to return home 5. Neuropsych: This patient iscapable of making decisions  on herown behalf. -hx Alzheimer's dementia Aricept '10mg'$  qhs 6. Skin/Wound Care: local care, optimize nutrition 7. Fluids/Electrolytes/Nutrition: encourage PO -protein supps -check admission labs 8. RA: continue prednisone per baseline 7.'5mg'$  daily -MTX '10mg'$  weekly on Thursdays 9. Hypothyroid: synthroid 98mg daily 10.  Hypertension- will start low dose amlodipine 2.'5mg'$  9/6-monitor effect and adjust dose Vitals:   08/04/17 1400 08/05/17 0500  BP: (!) 163/82 (!) 148/79  Pulse: 83 76  Resp: 18   Temp: 98.2 F (36.8 C) 98.2 F (36.8 C)  SpO2: 98% 93%   11.  Hypoalb- start prostat LOS  (Days) 3 A FACE TO FACE EVALUATION WAS PERFORMED  KIRSTEINS,ANDREW E 08/05/2017, 6:44 AM

## 2017-08-06 ENCOUNTER — Inpatient Hospital Stay (HOSPITAL_COMMUNITY): Payer: Medicare Other | Admitting: Occupational Therapy

## 2017-08-06 ENCOUNTER — Inpatient Hospital Stay (HOSPITAL_COMMUNITY): Payer: Medicare Other | Admitting: Physical Therapy

## 2017-08-06 DIAGNOSIS — I10 Essential (primary) hypertension: Secondary | ICD-10-CM

## 2017-08-06 NOTE — Progress Notes (Signed)
Occupational Therapy Session Note  Patient Details  Name: Charlene Duncan MRN: [id number] Date of Birth: 1936/04/18  Today's Date: 08/06/2017 OT Individual Time: 1130-1230 OT Individual Time Calculation (min): 60 min     Skilled Therapeutic Interventions/Progress Updates: Patient was sitting up in recliner with safety belt engaged and was agreeable to participate in OT session.     Focus included endurance, work simplification for managing pain through her activities, memory and focus for somewhat rote activities today  (toileting, grooming, home making skills).      She completed toileting with distant S for cleansing and clothing mgmt.    She complained her back her hurt, but went on to sink to apply makeupin standing before asking to sit down to relieve pressure/pain off her low back and right sacral area.     She was able to safely complete home skill actitivities such as making bed, straigtening her room and ADL apartment and appropriately self monitored when she needed to sit or lean due to right sacral area pain and fatigue.  She was left seated in her recliner with safety belt and phone and call bell in place at tne end of the session.  Therapy Documentation Precautions:  Precautions Precautions: Fall Restrictions Weight Bearing Restrictions: Yes RLE Weight Bearing: Weight bearing as tolerated LLE Weight Bearing: Weight bearing as tolerated   Pain:  RN had given meds already Pain Assessment Pain Assessment: 0-10 Pain Score: 8  Pain Type: Acute pain Pain Location: Hip Pain Orientation: Right Pain Descriptors / Indicators: Aching Pain Onset: With Activity Pain Intervention(s): Medication (See eMAR)       See Function Navigator for Current Functional Status.   Therapy/Group: Individual Therapy  Alfredia Ferguson [facility] 08/06/2017, 2:41 PM

## 2017-08-06 NOTE — Progress Notes (Addendum)
Charlene Duncan is a 81 y.o. female 1936-01-04 [id number]  Subjective: No new complaints. No new problems. Slept well. Feeling OK.  Objective: Vital signs in last 24 hours: Temp:  [97.9 F (36.6 C)-98.9 F (37.2 C)] 97.9 F (36.6 C) (09/08 0500) Pulse Rate:  [75-90] 75 (09/08 0500) Resp:  [16-17] 16 (09/08 0500) BP: (139-178)/(55-78) 178/78 (09/08 0500) SpO2:  [96 %-98 %] 98 % (09/08 0500) Weight change:  Last BM Date: 08/01/17 (pt reports day before yesterday?)  Intake/Output from previous day: 09/07 0701 - 09/08 0700 In: 59 [P.O.:960] Out: -  Last cbgs: CBG (last 3)  No results for input(s): GLUCAP in the last 72 hours.   Physical Exam General: No apparent distress  Eating breakfast HEENT: not dry Lungs: Normal effort. Lungs clear to auscultation, no crackles or wheezes. Cardiovascular: Regular rate and rhythm, no edema Abdomen: S/NT/ND; BS(+) Musculoskeletal:  unchanged Neurological: No new neurological deficits Wounds: N/A    Skin: clear  Aging changes Mental state: Alert, cooperative    Lab Results: BMET    Component Value Date/Time   NA 138 08/03/2017 0356   K 3.6 08/03/2017 0356   CL 104 08/03/2017 0356   CO2 28 08/03/2017 0356   GLUCOSE 103 (H) 08/03/2017 0356   BUN 23 (H) 08/03/2017 0356   CREATININE 0.96 08/03/2017 0356   CALCIUM 8.9 08/03/2017 0356   GFRNONAA 55 (L) 08/03/2017 0356   GFRAA >60 08/03/2017 0356   CBC    Component Value Date/Time   WBC 7.9 08/03/2017 0356   RBC 3.17 (L) 08/03/2017 0356   HGB 10.0 (L) 08/03/2017 0356   HCT 31.4 (L) 08/03/2017 0356   PLT 286 08/03/2017 0356   MCV 99.1 08/03/2017 0356   MCH 31.5 08/03/2017 0356   MCHC 31.8 08/03/2017 0356   RDW 15.7 (H) 08/03/2017 0356   LYMPHSABS 2.0 08/03/2017 0356   MONOABS 0.8 08/03/2017 0356   EOSABS 0.1 08/03/2017 0356   BASOSABS 0.0 08/03/2017 0356    Studies/Results: No results found.  Medications: I have reviewed the patient's current  medications.  Assessment/Plan:   1. Pelvic fx and L1 compression fx - CIR 2. DVT proph w/Lovenox 3. HTN - Amlodipine 4. LBP - Tramadol prn 5. Dementia - Aricept 6. RA - on Prednisone po 7. Hypothyroidism - on Synthroid 88 mcg/d    Length of stay, days: 4  Walker Kehr , MD 08/06/2017, 10:19 AM

## 2017-08-06 NOTE — Progress Notes (Signed)
Physical Therapy Session Note  Patient Details  Name: Charlene Duncan MRN: [id number] Date of Birth: 22-Feb-1936  Today's Date: 08/06/2017 PT Individual Time: 0814-0930 PT Individual Time Calculation (min): 76 min   Short Term Goals: Week 1:  PT Short Term Goal 1 (Week 1): STG =LTG due to ELOS   Skilled Therapeutic Interventions/Progress Updates:    Pt in bed upon arrival, agreeable to PT session. Bed mobility: supervision supine<>sitting, Transfers: supervision sit<>stand. Ambulation: 150 ft X2, 200 ft X2 - using rw and supervision. Exercise: supine; quad sets, heel slides, SAQ, hip abd/add, seated LAQ (each ex bilat. 1X10). Following session, pt returned to room, up in recliner chair with quick release belt on and telesitter on.   Therapy Documentation Precautions:  Precautions Precautions: Fall Restrictions Weight Bearing Restrictions: Yes RLE Weight Bearing: Weight bearing as tolerated LLE Weight Bearing: Weight bearing as tolerated Pain: Reports as moderate in Rt hip. Reports already having pain meds this am.   See Function Navigator for Current Functional Status.   Therapy/Group: Individual Therapy  Linard Millers, PT 08/06/2017, 8:55 AM

## 2017-08-06 NOTE — Progress Notes (Signed)
Physical Therapy Session Note  Patient Details  Name: Charlene Duncan MRN: [id number] Date of Birth: 1936/05/02  Today's Date: 08/06/2017 PT Individual Time: 7588-3254 PT Individual Time Calculation (min): 55 min    Skilled Therapeutic Interventions/Progress Updates:    Session initiated with pt up in bed requesting to use rest room and pain pill.  Nursing issued pain pill and pt reports 8/10 pain in R hip.  Session focused on improving endurance, stability, LE strength, and ROM to decrease burden of care upon D/C.  Pt ambulated to rest room with RW and CGA. Performed toilet transfer with CGA and supervision for pericare.  Ambulating through close quarters, pt demonstrates decreased safety with maneuvering around obstacles and requires verbal cues for technique. Pt ambulated during session with RW for 2 x 150 ft, 1 x 100 ft  With supervision to min A depending on the environment.  LE there ex performed consisting of supine hip abduction with R LE for 3 x 10, ankle pumps on B LE for 3 x 10, and heel slides into hip flexion for 3 x 10.  Pt performs sit to stand for 1 x 10 to RW utilizing errorless learning for technique, heel slides for 2 x 10 with B UE support.  Pt also performed standing activity folding washclothes and worked on finding and retrieving plate and silverware in the kitchen while ambulating.  Following session, pt returned to bed with verbal cues and RW.  Pt left up in bed with family in room and 2 rails up.  Nursing aware of pt location.  Therapy Documentation Precautions:  Precautions Precautions: Fall Restrictions Weight Bearing Restrictions: Yes RLE Weight Bearing: Weight bearing as tolerated LLE Weight Bearing: Weight bearing as tolerated Pain: Pain Assessment Pain Assessment: 0-10 Pain Score: 8  Pain Type: Acute pain Pain Location: Hip Pain Orientation: Right Pain Descriptors / Indicators: Aching Pain Onset: With Activity Pain Intervention(s): Medication (See  eMAR)   See Function Navigator for Current Functional Status.   Therapy/Group: Individual Therapy  Sudais Banghart Hilario Quarry 08/06/2017, 3:03 PM

## 2017-08-07 DIAGNOSIS — R0789 Other chest pain: Secondary | ICD-10-CM

## 2017-08-07 MED ORDER — SORBITOL 70 % SOLN: 30.0000 mL | Freq: Every day | Status: DC | PRN

## 2017-08-07 NOTE — Progress Notes (Signed)
Charlene Duncan is a 81 y.o. female Oct 19, 1936 [id number]  Subjective: No new complaints. Pt had an episode of CP this am relieved w/Mylanta. Slept well. Feeling OK - no CP.  Objective: Vital signs in last 24 hours: Temp:  [98 F (36.7 C)-98.2 F (36.8 C)] 98.2 F (36.8 C) (09/09 0455) Pulse Rate:  [79-96] 79 (09/09 0455) Resp:  [18] 18 (09/09 0455) BP: (147-177)/(66-74) 177/74 (09/09 0455) SpO2:  [94 %-98 %] 94 % (09/09 0455) Weight:  [111 lb (50.3 kg)] 111 lb (50.3 kg) (09/09 0455) Weight change:  Last BM Date: 08/01/17  Intake/Output from previous day: 09/08 0701 - 09/09 0700 In: 360 [P.O.:360] Out: -  Last cbgs: CBG (last 3)  No results for input(s): GLUCAP in the last 72 hours.   Physical Exam General: No apparent distress   HEENT: not dry Lungs: Normal effort. Lungs clear to auscultation, no crackles or wheezes. Cardiovascular: Regular rate and rhythm, no edema Abdomen: S/NT/ND; BS(+) Musculoskeletal:  unchanged Neurological: No new neurological deficits Wounds: N/A    Skin: clear  Aging changes Mental state: Alert, cooperative    Lab Results: BMET    Component Value Date/Time   NA 138 08/03/2017 0356   K 3.6 08/03/2017 0356   CL 104 08/03/2017 0356   CO2 28 08/03/2017 0356   GLUCOSE 103 (H) 08/03/2017 0356   BUN 23 (H) 08/03/2017 0356   CREATININE 0.96 08/03/2017 0356   CALCIUM 8.9 08/03/2017 0356   GFRNONAA 55 (L) 08/03/2017 0356   GFRAA >60 08/03/2017 0356   CBC    Component Value Date/Time   WBC 7.9 08/03/2017 0356   RBC 3.17 (L) 08/03/2017 0356   HGB 10.0 (L) 08/03/2017 0356   HCT 31.4 (L) 08/03/2017 0356   PLT 286 08/03/2017 0356   MCV 99.1 08/03/2017 0356   MCH 31.5 08/03/2017 0356   MCHC 31.8 08/03/2017 0356   RDW 15.7 (H) 08/03/2017 0356   LYMPHSABS 2.0 08/03/2017 0356   MONOABS 0.8 08/03/2017 0356   EOSABS 0.1 08/03/2017 0356   BASOSABS 0.0 08/03/2017 0356    Studies/Results: No results found.  Medications: I have  reviewed the patient's current medications.  Assessment/Plan:   1. Pelvic and L1 fx - CIR 2. DVT proph - Lovenox 3. HTN - cont w/Amlodipine 4. LBP - Tramadol prn 5. Dementia - cont w/Aricept 6. RA - cont w/Prednisone po 7. Hypothyroidism - cont w/Synthroid 8. Atypical CP - promptly resolved w/Mylanta     Length of stay, days: 5  Walker Kehr , MD 08/07/2017, 8:05 AM

## 2017-08-08 ENCOUNTER — Inpatient Hospital Stay (HOSPITAL_COMMUNITY): Payer: Medicare Other | Admitting: Physical Therapy

## 2017-08-08 ENCOUNTER — Inpatient Hospital Stay (HOSPITAL_COMMUNITY): Payer: Medicare Other

## 2017-08-08 ENCOUNTER — Inpatient Hospital Stay (HOSPITAL_COMMUNITY): Payer: Medicare Other | Admitting: Occupational Therapy

## 2017-08-08 DIAGNOSIS — S32010G Wedge compression fracture of first lumbar vertebra, subsequent encounter for fracture with delayed healing: Secondary | ICD-10-CM

## 2017-08-08 MED ORDER — CALCIUM CARBONATE ANTACID 500 MG PO CHEW
1.0000 | CHEWABLE_TABLET | Freq: Two times a day (BID) | ORAL | Status: DC | PRN
  Administered 2017-08-09: 200 mg via ORAL
  Filled 2017-08-08: qty 1

## 2017-08-08 NOTE — Progress Notes (Signed)
Occupational Therapy Session Note  Patient Details  Name: Charlene Duncan MRN: [id number] Date of Birth: 08/31/1936  Today's Date: 08/08/2017 OT Individual Time: 5726-2035 OT Individual Time Calculation (min): 73 min    Short Term Goals: Week 1:  OT Short Term Goal 1 (Week 1): STG=LTG  Skilled Therapeutic Interventions/Progress Updates:    OT treatment session focused on modified bathing/dressing, standing balance, functional ambulation, and UB strengthening. Pt collected clothing with verbal cues for RW position to access drawers. Pt then ambulated to the bathroom w/ RW and supervision. Clothing doffed seated on tub bench with min A to advance pants off of R LE. Pt needed supervision for safety while showering as pt often impulsive to stand. Pt with improved pain tolerance today to achieve digure 4 position to don socks. Pt needed min A to don TED hose, but able to complete all other dressing with supervision. Pt ambulated to day room with RW and supervision. She then completed UB there-ex on [employer] arm bike for 15 mins total. On level 1.5 and RPMs above 25. Pt needed min cues to keep RPMSs above stated range. Pt ambulated back to therapy apartment and practiced tub bench transfer with supervision. Pt left seated in recliner with needs met.   Therapy Documentation Precautions:  Precautions Precautions: Fall Restrictions Weight Bearing Restrictions: Yes RLE Weight Bearing: Weight bearing as tolerated LLE Weight Bearing: Weight bearing as tolerated Pain: Pain Assessment Pain Assessment: 0-10 Pain Score: 7  Pain Type: Acute pain Pain Location: Hip Pain Orientation: Right Pain Descriptors / Indicators: Aching Pain Onset: On-going Pain Intervention(s): Repositioned ADL: ADL ADL Comments: see functional navigator  See Function Navigator for Current Functional Status.   Therapy/Group: Individual Therapy  Valma Cava 08/08/2017, 2:21 PM

## 2017-08-08 NOTE — Progress Notes (Signed)
Subjective/Complaints: Pt asked me to sign her living will , asking about D/C date Had episode of Chest/epigastric pain, last noc, lasted ~30 min resolved with coke sips, has this at home and is relieved by TUMS   ROS:  No N/V/D, neg abd pain  Objective: Vital Signs: Blood pressure (!) 159/80, pulse 87, temperature 98.3 F (36.8 C), temperature source Oral, resp. rate 18, height 5' (1.524 m), weight 51.3 kg (113 lb), SpO2 98 %. No results found. No results found for this or any previous visit (from the past 72 hour(s)).   HEENT: normal Cardio: RRR and no murmur Resp: CTA B/L and unlabored GI: BS positive and NT, ND Extremity:  No Edema Skin:   Intact Neuro: Confused,  and Abnormal Motor 5/5 in Bilateral delt Bi tri grip Left HF, KE, ankle DF 4- Right HF/KE limited by pain, 5/5 in R ankle DF/PF Musc/Skel:  Other pain with Right LE movement in Right groin area Gen NAD   Assessment/Plan: 1. Functional deficits secondary to right pubic ramus fracture  which require 3+ hours per day of interdisciplinary therapy in a comprehensive inpatient rehab setting. Physiatrist is providing close team supervision and 24 hour management of active medical problems listed below. Physiatrist and rehab team continue to assess barriers to discharge/monitor patient progress toward functional and medical goals. FIM: Function - Bathing Position: Sitting EOB Body parts bathed by patient: Right arm, Left arm, Chest, Abdomen, Front perineal area, Buttocks, Right upper leg, Left upper leg, Right lower leg, Left lower leg Body parts bathed by helper: Back Assist Level: Touching or steadying assistance(Pt > 75%)  Function- Upper Body Dressing/Undressing What is the patient wearing?: Pull over shirt/dress Pull over shirt/dress - Perfomed by patient: Thread/unthread right sleeve, Thread/unthread left sleeve, Put head through opening, Pull shirt over trunk Assist Level: Set up Function - Lower Body  Dressing/Undressing What is the patient wearing?: Underwear, Pants Position: Other (comment) Underwear - Performed by patient: Pull underwear up/down Underwear - Performed by helper: Thread/unthread right underwear leg, Thread/unthread left underwear leg Pants- Performed by patient: Pull pants up/down Pants- Performed by helper: Thread/unthread right pants leg, Thread/unthread left pants leg Socks - Performed by helper: Don/doff right sock, Don/doff left sock Shoes - Performed by helper: Don/doff right shoe, Don/doff left shoe, Fasten left, Fasten right TED Hose - Performed by helper: Don/doff left TED hose, Don/doff right TED hose Assist for footwear: Maximal assist (helper completed, patient attempted) Assist for lower body dressing:  (20%, max assist)  Function - Toileting Toileting steps completed by patient: Adjust clothing prior to toileting, Performs perineal hygiene, Adjust clothing after toileting Toileting steps completed by helper: Adjust clothing prior to toileting, Adjust clothing after toileting Toileting Assistive Devices: Grab bar or rail Assist level: Touching or steadying assistance (Pt.75%)  Function - Air cabin crew transfer assistive device: Grab bar Assist level to toilet: Touching or steadying assistance (Pt > 75%) Assist level from toilet: Touching or steadying assistance (Pt > 75%)  Function - Chair/bed transfer Chair/bed transfer method: Ambulatory Chair/bed transfer assist level: Supervision or verbal cues Chair/bed transfer assistive device: Armrests, Walker  Function - Locomotion: Wheelchair Will patient use wheelchair at discharge?: No Type: Manual Max wheelchair distance: 120 Assist Level: Supervision or verbal cues Assist Level: Supervision or verbal cues Wheel 150 feet activity did not occur: Safety/medical concerns Turns around,maneuvers to table,bed, and toilet,negotiates 3% grade,maneuvers on rugs and over doorsills: No Function -  Locomotion: Ambulation Assistive device: Walker-rolling Max distance: 200 Assist level: Supervision  or verbal cues Assist level: Supervision or verbal cues Assist level: Supervision or verbal cues Walk 150 feet activity did not occur: Safety/medical concerns Assist level: Supervision or verbal cues Walk 10 feet on uneven surfaces activity did not occur: Safety/medical concerns  Function - Comprehension Comprehension: Auditory Comprehension assistive device: Hearing aids Comprehension assist level: Understands basic 90% of the time/cues < 10% of the time  Function - Expression Expression: Verbal Expression assist level: Expresses basic 90% of the time/requires cueing < 10% of the time.  Function - Social Interaction Social Interaction assist level: Interacts appropriately 90% of the time - Needs monitoring or encouragement for participation or interaction.  Function - Problem Solving Problem solving assist level: Solves basic 90% of the time/requires cueing < 10% of the time  Function - Memory Memory assist level: Recognizes or recalls 90% of the time/requires cueing < 10% of the time Patient normally able to recall (first 3 days only): That he or she is in a hospital  Medical Problem List and Plan: 1. Functional deficits and gait disordersecondary to right pubic rami fracture, L1 compression fracture -CIR PT, OT, SLP- should be able to D/C this week 2. DVT Prophylaxis/Anticoagulation: Pharmaceutical: Lovenox30mg  sq daily 3. Pain Management: tylenol and celebrex prn.  -tramadol for more severe pain 4. Mood: cymbalta per home regimen. Mood seems up beat -she's anxious to return home 5. Neuropsych: This patient iscapable of making decisions on herown behalf. -hx Alzheimer's dementia Aricept 10mg  qhs 6. Skin/Wound Care: local care, optimize nutrition 7. Fluids/Electrolytes/Nutrition: encourage PO -protein  supps -check admission labs 8. RA: continue prednisone per baseline 7.5mg  daily -MTX 10mg  weekly on Thursdays 9. Hypothyroid: synthroid 82mcg daily 10.  Hypertension- will start low dose amlodipine 2.5mg  9/6-increase to 5mg  Vitals:   08/07/17 1410 08/08/17 0601  BP: (!) 151/73 (!) 159/80  Pulse: 90 87  Resp: 18 18  Temp: 98.3 F (36.8 C) 98.3 F (36.8 C)  SpO2: 98% 98%   11.  Hypoalb- start prostat 12.  Hx GERD with epigastric discomfort will write for prn TUMs  , if pt has discomfort during therapy would check EKG, and enzymes       LOS (Days) 6 A FACE TO FACE EVALUATION WAS PERFORMED  Currie Dennin E 08/08/2017, 6:52 AM

## 2017-08-08 NOTE — Progress Notes (Signed)
Physical Therapy Session Note  Patient Details  Name: Charlene Duncan MRN: [id number] Date of Birth: 1936-02-04  Today's Date: 08/08/2017 PT Individual Time: 0800-0900 PT Individual Time Calculation (min): 60 min   Short Term Goals: Week 1:  PT Short Term Goal 1 (Week 1): STG =LTG due to ELOS   Skilled Therapeutic Interventions/Progress Updates:    Pt sitting EOB finishing breakfast upon PT arrival, agreeable to therapy tx and reports pain 8/10 in R posterior hip. Pt seated EOB donned clean shirt, socks and shoes all with supervision. Pt ambulated x 5 ft to the sink with RW and pt standing without UE support working on dynamic balance to don lipstick with supervision. Pt ambulated x 100 ft to the gym using RW and with supervision, verbal cues for increased step length. Pt performed standing therex for hip strengthening: 2 x 10 hip abduction and 2 x 10 standing marches. Focused on dynamic balance pt performed card matching using single UE while standing on foam, x 2 trials. Pt worked on static standing balance while standing on foam without UE support x 2 trials and requiring intermittent assist to maintain balance. Pt ambulated x 100 ft to rehab apartment using RW with supervision. Pt performed transfers and bed mobility in the rehab apartment with supervision. Pt performed car transfer with supervision and verbal cues for technique. Pt ascended ramp, navigated uneven surfaces on mulch and descended curb using RW and with min assist secondary to increased fear of falling. Pt ambulated back to room and left in recliner with needs in reach, QRB in place.    Therapy Documentation Precautions:  Precautions Precautions: Fall Restrictions Weight Bearing Restrictions: Yes RLE Weight Bearing: Weight bearing as tolerated LLE Weight Bearing: Weight bearing as tolerated   See Function Navigator for Current Functional Status.   Therapy/Group: Individual Therapy  Netta Corrigan, PT,  DPT 08/08/2017, 8:24 AM

## 2017-08-08 NOTE — Progress Notes (Signed)
Physical Therapy Session Note  Patient Details  Name: Charlene Duncan MRN: [id number] Date of Birth: [birth year]  Today's Date: 08/08/2017 PT Individual Time: 1530-1620 PT Individual Time Calculation (min): 50 min   Short Term Goals: No short term goals set  Skilled Therapeutic Interventions/Progress Updates:    pt c/o "pretty bad" pain in hip at rest but agreeable to therapy session.  Session focus on gait training with RW and HEP.    Pt ambulates throughout unit, focus on step length and use of RW for R hip pain management during weight bearing.  Max distance 200' with supervision, min verbal cues for use of RW throughout.  Pt transfers throughout session with supervision.  PT instructs pt in 2x10 BLE therex for strengthening LAQ, hamstring curl and hip abd with level 2 theraband, pillow squeezes, minisquats, and standing heel/toe raises.  Pt returned to room at end of session, requesting back to bed and pain medication.  Supervision for bed mobility, RN in at end of session.   Therapy Documentation Precautions:  Precautions Precautions: Fall Restrictions Weight Bearing Restrictions: Yes RLE Weight Bearing: Weight bearing as tolerated LLE Weight Bearing: Weight bearing as tolerated General: PT Amount of Missed Time (min): 10 Minutes PT Missed Treatment Reason: Pain (9/10 pain)   See Function Navigator for Current Functional Status.   Therapy/Group: Individual Therapy  Michel Santee 08/08/2017, 4:20 PM

## 2017-08-09 ENCOUNTER — Inpatient Hospital Stay (HOSPITAL_COMMUNITY): Payer: Medicare Other | Admitting: Occupational Therapy

## 2017-08-09 ENCOUNTER — Inpatient Hospital Stay (HOSPITAL_COMMUNITY): Payer: Medicare Other | Admitting: Physical Therapy

## 2017-08-09 LAB — CBC
HCT: 33.7 % — ABNORMAL LOW (ref 36.0–46.0)
HEMOGLOBIN: 10.5 g/dL — AB (ref 12.0–15.0)
MCH: 31.4 pg (ref 26.0–34.0)
MCHC: 31.2 g/dL (ref 30.0–36.0)
MCV: 100.9 fL — AB (ref 78.0–100.0)
PLATELETS: 455 10*3/uL — AB (ref 150–400)
RBC: 3.34 MIL/uL — AB (ref 3.87–5.11)
RDW: 16 % — ABNORMAL HIGH (ref 11.5–15.5)
WBC: 9.5 10*3/uL (ref 4.0–10.5)

## 2017-08-09 LAB — BASIC METABOLIC PANEL
ANION GAP: 7 (ref 5–15)
BUN: 29 mg/dL — ABNORMAL HIGH (ref 6–20)
CO2: 31 mmol/L (ref 22–32)
Calcium: 9 mg/dL (ref 8.9–10.3)
Chloride: 97 mmol/L — ABNORMAL LOW (ref 101–111)
Creatinine, Ser: 0.82 mg/dL (ref 0.44–1.00)
GFR calc Af Amer: >60 mL/min (ref >60)
GLUCOSE: 86 mg/dL (ref 65–99)
POTASSIUM: 3.9 mmol/L (ref 3.5–5.1)
SODIUM: 135 mmol/L (ref 135–145)

## 2017-08-09 NOTE — Progress Notes (Signed)
Physical Therapy Discharge Summary  Patient Details  Name: Charlene Duncan MRN: [id number] Date of Birth: 1936-09-08  Today's Date: 08/09/2017 PT Individual Time: 0900-1000 PT Individual Time Calculation (min): 60 min    Patient has met 9 of 9 long term goals due to improved activity tolerance, improved balance and ability to compensate for deficits.  Patient to discharge at an ambulatory level Supervision.    Recommendation:  Patient will benefit from ongoing skilled PT services in home health setting to continue to advance safe functional mobility, address ongoing impairments in balance, strength, and activity tolerance, and minimize fall risk.  Equipment: RW  Reasons for discharge: treatment goals met  Patient/family agrees with progress made and goals achieved: Yes   Skilled PT intervention: Pt c/o pain in hip, does not rate, but grimaces with movement.  States pre-medicated but RN alerted to pain level.  Session focus on d/c assessment, transfers, gait, and activity tolerance.    Pt transfers throughout session mod I for sit<>stand and stand/pivot.  Toileting with distant supervision.  Gait with distant supervision, max distance 150' with RW.  Pt negotiates 8 steps and ambulates up/down ramp and on uneven surfaces with supervision and min verbal cues for walker positioning.  Pt returned to room at end of session and positioned in recliner with call bell in reach and needs met.   PT Discharge Precautions/Restrictions Precautions Precautions: Fall Restrictions RLE Weight Bearing: Weight bearing as tolerated  Pain Pain Assessment Pain Assessment: Faces Pain Score: 7  Faces Pain Scale: Hurts even more Pain Type: Acute pain Pain Location: Hip Pain Orientation: Right Pain Descriptors / Indicators: Aching Pain Onset: On-going Pain Intervention(s): Ambulation/increased activity;Emotional support;RN made aware Vision/Perception  Perception Perception: Within Functional  Limits Praxis Praxis: Intact  Cognition Overall Cognitive Status: History of cognitive impairments - at baseline Arousal/Alertness: Awake/alert Orientation Level: Oriented to person;Oriented to situation (oriented to month and year, requires cues for orientation to hospital) Memory: Impaired Awareness: Impaired Awareness Impairment: Anticipatory impairment Safety/Judgment: Appears intact Sensation Sensation Light Touch: Appears Intact Hot/Cold: Appears Intact Coordination Gross Motor Movements are Fluid and Coordinated: Yes Fine Motor Movements are Fluid and Coordinated: Yes Motor  Motor Motor - Discharge Observations: decreased strength RLE>LLE due to pain  Mobility Bed Mobility Bed Mobility: Supine to Sit Supine to Sit: 6: Modified independent (Device/Increase time) Sit to Supine: 6: Modified independent (Device/Increase time) Transfers Transfers: Yes Sit to Stand: 6: Modified independent (Device/Increase time) Stand to Sit: 6: Modified independent (Device/Increase time) Stand Pivot Transfers: 6: Modified independent (Device/Increase time) Locomotion  Ambulation Ambulation: Yes Ambulation/Gait Assistance: 5: Supervision Ambulation Distance (Feet): 150 Feet Assistive device: Rolling walker Gait Gait: Yes Gait Pattern: Step-through pattern;Antalgic;Trunk flexed Stairs / Additional Locomotion Stairs: Yes Stairs Assistance: 5: Supervision Stairs Assistance Details: Verbal cues for gait pattern Stair Management Technique: Two rails Number of Stairs: 8 Ramp: 5: Supervision Wheelchair Mobility Wheelchair Mobility: No  Trunk/Postural Assessment  Cervical Assessment Cervical Assessment: Within Functional Limits Thoracic Assessment Thoracic Assessment: Within Functional Limits Lumbar Assessment Lumbar Assessment: Within Functional Limits Postural Control Postural Control: Deficits on evaluation (limited by pain)  Balance Balance Balance Assessed: Yes Static  Sitting Balance Static Sitting - Level of Assistance: 6: Modified independent (Device/Increase time) Dynamic Sitting Balance Dynamic Sitting - Level of Assistance: 6: Modified independent (Device/Increase time) Static Standing Balance Static Standing - Level of Assistance: 6: Modified independent (Device/Increase time) Dynamic Standing Balance Dynamic Standing - Level of Assistance: 5: Stand by assistance Extremity Assessment      RLE  Assessment RLE Assessment:  (continues to be limited by pain, no change since eval) LLE Assessment LLE Assessment: Within Functional Limits   See Function Navigator for Current Functional Status.  Michel Santee 08/09/2017, 9:46 AM

## 2017-08-09 NOTE — Plan of Care (Signed)
Problem: RH Tub/Shower Transfers Goal: LTG Patient will perform tub/shower transfers w/assist (OT) LTG: Patient will perform tub/shower transfers with assist, with/without cues using equipment (OT)  Outcome: Not Met (add Reason) Pt needs supervision for tub shower transfers for safety

## 2017-08-09 NOTE — Progress Notes (Signed)
Subjective/Complaints:  Trouble remembering her dog's name.  No c/o bowel and bladder ok  ROS:  No N/V/D, neg abd pain  Objective: Vital Signs: Blood pressure (!) 158/62, pulse 79, temperature 98 F (36.7 C), temperature source Oral, resp. rate 18, height 5' (1.524 m), weight 50.8 kg (112 lb), SpO2 98 %. No results found. Results for orders placed or performed during the hospital encounter of 08/02/17 (from the past 72 hour(s))  CBC     Status: Abnormal   Collection Time: 08/09/17  5:31 AM  Result Value Ref Range   WBC 9.5 4.0 - 10.5 K/uL   RBC 3.34 (L) 3.87 - 5.11 MIL/uL   Hemoglobin 10.5 (L) 12.0 - 15.0 g/dL   HCT 33.7 (L) 36.0 - 46.0 %   MCV 100.9 (H) 78.0 - 100.0 fL   MCH 31.4 26.0 - 34.0 pg   MCHC 31.2 30.0 - 36.0 g/dL   RDW 16.0 (H) 11.5 - 15.5 %   Platelets 455 (H) 150 - 400 K/uL  Basic metabolic panel     Status: Abnormal   Collection Time: 08/09/17  5:31 AM  Result Value Ref Range   Sodium 135 135 - 145 mmol/L   Potassium 3.9 3.5 - 5.1 mmol/L   Chloride 97 (L) 101 - 111 mmol/L   CO2 31 22 - 32 mmol/L   Glucose, Bld 86 65 - 99 mg/dL   BUN 29 (H) 6 - 20 mg/dL   Creatinine, Ser 0.82 0.44 - 1.00 mg/dL   Calcium 9.0 8.9 - 10.3 mg/dL   GFR calc non Af Amer >60 >60 mL/min   GFR calc Af Amer >60 >60 mL/min    Comment: (NOTE) The eGFR has been calculated using the CKD EPI equation. This calculation has not been validated in all clinical situations. eGFR's persistently <60 mL/min signify possible Chronic Kidney Disease.    Anion gap 7 5 - 15     HEENT: normal Cardio: RRR and no murmur Resp: CTA B/L and unlabored GI: BS positive and NT, ND Extremity:  No Edema Skin:   Intact Neuro: Confused,  and Abnormal Motor 5/5 in Bilateral delt Bi tri grip Left HF, KE, ankle DF 4- Right HF/KE limited by pain, 5/5 in R ankle DF/PF Musc/Skel:  Other pain with Right LE movement in Right groin area Gen NAD   Assessment/Plan: 1. Functional deficits secondary to right pubic  ramus fracture  which require 3+ hours per day of interdisciplinary therapy in a comprehensive inpatient rehab setting. Physiatrist is providing close team supervision and 24 hour management of active medical problems listed below. Physiatrist and rehab team continue to assess barriers to discharge/monitor patient progress toward functional and medical goals. FIM: Function - Bathing Position: Wheelchair/chair at sink Body parts bathed by patient: Right arm, Left arm, Chest, Abdomen, Front perineal area, Buttocks, Right upper leg, Left upper leg, Right lower leg, Left lower leg Body parts bathed by helper: Back Assist Level: Supervision or verbal cues  Function- Upper Body Dressing/Undressing What is the patient wearing?: Pull over shirt/dress Pull over shirt/dress - Perfomed by patient: Thread/unthread right sleeve, Thread/unthread left sleeve, Put head through opening, Pull shirt over trunk Assist Level: More than reasonable time Function - Lower Body Dressing/Undressing What is the patient wearing?: Underwear, Pants, [employer], Shoes Position: Sitting EOB Underwear - Performed by patient: Thread/unthread right underwear leg, Thread/unthread left underwear leg, Pull underwear up/down Underwear - Performed by helper: Thread/unthread right underwear leg, Thread/unthread left underwear leg Pants- Performed by patient:  Thread/unthread left pants leg, Thread/unthread right pants leg, Pull pants up/down Pants- Performed by helper: Thread/unthread right pants leg, Thread/unthread left pants leg Socks - Performed by patient: Don/doff right sock, Don/doff left sock Socks - Performed by helper: Don/doff right sock, Don/doff left sock Shoes - Performed by patient: Don/doff right shoe, Don/doff left shoe, Fasten right, Fasten left Shoes - Performed by helper: Don/doff right shoe, Don/doff left shoe, Fasten left, Fasten right TED Hose - Performed by helper: Don/doff left TED hose, Don/doff right TED  hose Assist for footwear: Supervision/touching assist Assist for lower body dressing: Supervision or verbal cues  Function - Toileting Toileting steps completed by patient: Adjust clothing prior to toileting, Performs perineal hygiene, Adjust clothing after toileting Toileting steps completed by helper: Adjust clothing prior to toileting, Adjust clothing after toileting Toileting Assistive Devices: Grab bar or rail Assist level: Touching or steadying assistance (Pt.75%)  Function - Air cabin crew transfer assistive device: Grab bar Assist level to toilet: Moderate assist (Pt 50 - 74%/lift or lower) Assist level from toilet: Moderate assist (Pt 50 - 74%/lift or lower)  Function - Chair/bed transfer Chair/bed transfer method: Ambulatory Chair/bed transfer assist level: Supervision or verbal cues Chair/bed transfer assistive device: Armrests, Walker  Function - Locomotion: Wheelchair Will patient use wheelchair at discharge?: No Type: Manual Max wheelchair distance: 120 Assist Level: Supervision or verbal cues Assist Level: Supervision or verbal cues Wheel 150 feet activity did not occur: Safety/medical concerns Turns around,maneuvers to table,bed, and toilet,negotiates 3% grade,maneuvers on rugs and over doorsills: No Function - Locomotion: Ambulation Assistive device: Walker-rolling Max distance: 150 ft Assist level: Supervision or verbal cues Assist level: Supervision or verbal cues Assist level: Supervision or verbal cues Walk 150 feet activity did not occur: Safety/medical concerns Assist level: Supervision or verbal cues Walk 10 feet on uneven surfaces activity did not occur: Safety/medical concerns  Function - Comprehension Comprehension: Auditory Comprehension assistive device: Hearing aids Comprehension assist level: Understands basic 90% of the time/cues < 10% of the time  Function - Expression Expression: Verbal Expression assist level: Expresses basic  90% of the time/requires cueing < 10% of the time.  Function - Social Interaction Social Interaction assist level: Interacts appropriately 90% of the time - Needs monitoring or encouragement for participation or interaction.  Function - Problem Solving Problem solving assist level: Solves basic 90% of the time/requires cueing < 10% of the time  Function - Memory Memory assist level: Recognizes or recalls 90% of the time/requires cueing < 10% of the time Patient normally able to recall (first 3 days only): That he or she is in a hospital  Medical Problem List and Plan: 1. Functional deficits and gait disordersecondary to right pubic rami fracture, L1 compression fracture -CIR PT, OT, SLP-team conf in am2. DVT Prophylaxis/Anticoagulation: Pharmaceutical: Lovenox4m sq daily 3. Pain Management: tylenol and celebrex prn.  -tramadol for more severe pain 4. Mood: cymbalta per home regimen. Mood seems up beat -she's anxious to return home 5. Neuropsych: This patient iscapable of making decisions on herown behalf. -hx Alzheimer's dementia Aricept 132mqhs 6. Skin/Wound Care: local care, optimize nutrition 7. Fluids/Electrolytes/Nutrition: encourage PO -protein supps -check admission labs 8. RA: continue prednisone per baseline 43m36maily -MTX 63m21mekly on Thursdays 9. Hypothyroid: synthroid 88mc61mily 10.  Hypertension- will start low dose amlodipine 2.43mg 970mincrease to 43mg, s243m lability noted Vitals:   08/08/17 1437 08/09/17 0555  BP: (!) 107/52 (!) 158/62  Pulse: 85 79  Resp: 15 18  Temp: 98.1  F (36.7 C) 98 F (36.7 C)  SpO2: 97% 98%   11.  Hypoalb- start prostat 12.  Hx GERD with epigastric discomfort will write for prn TUMs  , amb 100' in PT asymptomatic    LOS (Days) 7 A FACE TO FACE EVALUATION WAS PERFORMED  Aedin Jeansonne E 08/09/2017, 7:01 AM

## 2017-08-09 NOTE — Progress Notes (Signed)
Social Work  Discharge Note  The overall goal for the admission was met for:   Discharge location: [place]  Length of Stay: Yes-8 DAYS  Discharge activity level: Yes-SUPERVISION LEVEL  Home/community participation: Yes  Services provided included: MD, RD, PT, OT, SLP, RN, CM, Pharmacy and [place]: Medicare and Private Insurance: [place]  Follow-up services arranged: Home Health: [place], DME: [place] and Patient/Family has no preference for HH/DME agencies  Comments (or additional information):SON TO INCREASE THE CAREGIVER'S HOURS-NOT TO [place]. DAUGHTER AND SON IN[facility]ISIT[facility] FROM [facility] AND WILL BE HERE FOR A SHORT TIME. SON AWARE OF TEAM'S RECOMMENDATION OF 24 HR SUPERVISION. UNSURE IF WILL HAVE 24 HR SUPERVISION AT DISCHARGE. DISCUSSED OPTION OF ASSISTED LIVING.  Patient/Family verbalized understanding of follow-up arrangements: Yes  Individual responsible for coordination of the follow-up plan: JIMMY-SON  Confirmed correct DME delivered: [name] 08/09/2017    [name]

## 2017-08-09 NOTE — Progress Notes (Signed)
Occupational Therapy Discharge Summary  Patient Details  Name: Charlene Duncan MRN: [id number] Date of Birth: [birth year]  Today's Date: 08/09/2017 OT Individual Time: 1400-1530 OT Individual Time Calculation (min): 90 min   Pt much more anxious this afternoon and in more pain. Informed RN and she administered pain medication. OT treatment session focused on modified bathing/dressing, activity tolerance, safety awareness and pain management. Pt collected clothing with supervision and RW. She needed min cues for RW placement when turning to sit onto tub bench. Pt able to wash 10/10 body parts without assistance. UB and LB dressing completed mod I  With increased time 2/2 painful R LE. B UE coordination and standing balance/endurance with hair blow drying task. Pt with no LOB during grooming activity and demonstrated safe use of RW at the sink. Pt then ambulated to therapy apartment and was unable to recall practicing tub transfer yesterday using tub transfer bench. OT demonstrated tub shower transfer with tub bench and then pt completed transfer with encouragement and supervision/verbal cues 2/2 unfamiliar task and new environment. Discussed OT recommendation of tub transfer bench being the only safe tub  Shower transfer for pt to complete at this time, pt reports understanding. Pt ambulated back to room at end of session with RW and transferred back to bed without assistance. Pt left with bed alarm on and needs met.    Patient has met 9 of 10 long term goals due to improved activity tolerance, improved balance, ability to compensate for deficits and improved attention.  Patient to discharge at overall Supervision/ Modified Independent level.  Patient's care partner is independent to provide the necessary cognitive assistance at discharge.    Reasons goals not met: Pt requires set-up A and supervision for bathing 2/2 risk of falls. Recommend obtains tub transfer bench as this is the only safe way for pt  to transfer in and out of tub shower at home.   Recommendation:  Patient will benefit from ongoing skilled OT services in home health setting to continue to advance functional skills in the area of BADL.  Equipment: Conservation officer, nature, Tub Producer, television/film/video  Reasons for discharge: treatment goals met and discharge from hospital  Patient/family agrees with progress made and goals achieved: Yes  OT Discharge Precautions/Restrictions  Precautions Precautions: Fall Restrictions Weight Bearing Restrictions: Yes RLE Weight Bearing: Weight bearing as tolerated LLE Weight Bearing: Weight bearing as tolerated Pain Pain Assessment Pain Assessment: 0-10 Pain Score: 8  Pain Type: Acute pain Pain Location: Hip Pain Orientation: Right Pain Descriptors / Indicators: Aching Pain Onset: On-going Pain Intervention(s): Repositioned (RN administered pain meds) ADL ADL Eating: Independent Grooming: Modified independent Upper Body Bathing: Modified independent Where Assessed-Upper Body Bathing: Shower Lower Body Bathing: Modified independent Where Assessed-Lower Body Bathing: Shower Upper Body Dressing: Independent Lower Body Dressing: Modified independent Toileting: Modified independent Toilet Transfer: Modified independent Armed forces technical officer Method: Ambulating Tub/Shower Transfer: Close supervison Clinical cytogeneticist Method: Ambulating Tub/Shower Equipment: Radio broadcast assistant ADL Comments: Please see functional navigator Perception  Perception: Within Functional Limits Praxis Praxis: Intact Cognition Overall Cognitive Status: History of cognitive impairments - at baseline Arousal/Alertness: Awake/alert Orientation Level: Oriented to person;Oriented to situation (oriented to month and year, requires cues for orientation to hospital) Memory: Impaired Awareness: Impaired Problem Solving: Impaired Safety/Judgment: Appears intact Sensation Sensation Light Touch: Appears  Intact Stereognosis: Appears Intact Hot/Cold: Appears Intact Proprioception: Appears Intact Coordination Gross Motor Movements are Fluid and Coordinated: Yes Fine Motor Movements are Fluid and Coordinated: Yes Finger Nose Finger Test: [facility] Motor  Motor Motor: Other (comment) Motor - Discharge Observations: decreased strength RLE>LLE due to pain Mobility  Bed Mobility Supine to Sit: 6: Modified independent (Device/Increase time) Sit to Supine: 6: Modified independent (Device/Increase time) Sit to Stand: 6: Modified independent (Device/Increase time) Stand to Sit: 6: Modified independent (Device/Increase time)  Trunk/Postural Assessment  Cervical Assessment Cervical Assessment: Within Functional Limits Thoracic Assessment Thoracic Assessment: Within Functional Limits Lumbar Assessment Lumbar Assessment: Within Functional Limits  Balance Balance Balance Assessed: Yes Static Sitting Balance Static Sitting - Level of Assistance: 6: Modified independent (Device/Increase time) Dynamic Sitting Balance Dynamic Sitting - Level of Assistance: 6: Modified independent (Device/Increase time) Static Standing Balance Static Standing - Level of Assistance: 6: Modified independent (Device/Increase time) Dynamic Standing Balance Dynamic Standing - Level of Assistance: 6: Modified independent (Device/Increase time) Extremity/Trunk Assessment RUE Assessment RUE Assessment: Within Functional Limits LUE Assessment LUE Assessment: Within Functional Limits   See Function Navigator for Current Functional Status.  Daneen Schick Freddi Forster 08/09/2017, 7:25 PM

## 2017-08-09 NOTE — Progress Notes (Signed)
   08/09/17 1300  Clinical Encounter Type  Visited With Patient;Health care provider  Visit Type Follow-up  Referral From Nurse  Consult/Referral To Chaplain  Spiritual Encounters  Spiritual Needs Literature  Stress Factors  Patient Stress Factors Health changes  Family Stress Factors Health changes;Lack of knowledge  Patient request was for advanced directive.  The son was not present to sign the document

## 2017-08-09 NOTE — Plan of Care (Signed)
Problem: RH Wheelchair Mobility Goal: LTG Patient will propel w/c in controlled environment (PT) LTG: Patient will propel wheelchair in controlled environment, # of feet with assist (PT)  Outcome: Not Applicable Date Met: 49/75/30 Pt ambulatory >150'

## 2017-08-09 NOTE — Progress Notes (Signed)
Patient's son was not present today to sign Press photographer with chaplain. Patient son reported he would be unable to be here tomorrow for discharge during business hours and patient's health aide might be the one to pick her up. Patient's son reported he would be able to have the advance directives done later after discharge.

## 2017-08-09 NOTE — Progress Notes (Signed)
Social Work Patient ID: Charlene Duncan, female   DOB: 1936/09/22, 81 y.o.   MRN: [id number]  Team feels pt will meet goals and ready for discharge tomorrow. MD aware and agreeable to this. Contacted son and made aware Of discharge tomorrow, wants rolling walker and reports has a tub seat. Prepare for discharge tomorrow.

## 2017-08-09 NOTE — Patient Care Conference (Signed)
Inpatient RehabilitationTeam Conference and Plan of Care Update Date: 08/10/2017   Time: 11:00 AM    Patient Name: Charlene Duncan      Medical Record Number: [id number]  Date of Birth: 06/29/1936 Sex: Female         Room/Bed: 4M01C/4M01C-01 Payor Info: Payor: MEDICARE / Plan: MEDICARE PART A AND B / Product Type: *No Product type* /    Admitting Diagnosis: pelvic fx  Admit Date/Time:  08/02/2017  6:13 PM Admission Comments: No comment available   Primary Diagnosis:  Closed fracture of multiple pubic rami, right, sequela Principal Problem: Closed fracture of multiple pubic rami, right, sequela  Patient Active Problem List   Diagnosis Date Noted  . HTN (hypertension) 08/10/2017  . Lumbar compression fracture ([place]) 08/02/2017  . Closed fracture of multiple pubic rami, right, sequela   . Anemia 08/01/2017  . Hyperglycemia 08/01/2017  . Compression fracture of first lumbar vertebra ([place]) 07/31/2017  . Alzheimer disease 05/16/2017  . Arthritis 06/16/2014  . Congenital hypothyroidism without goiter 06/16/2014  . History of breast cancer 06/16/2014  . Hyperlipidemia 06/16/2014  . Iron deficiency 06/16/2014    Expected Discharge Date: Expected Discharge Date: 08/10/17  Team Members Present: Physician leading conference: Dr. Alysia Penna Social Worker Present: Ovidio Kin, LCSW Nurse Present: Dorien Chihuahua, RN PT Present: Dwyane Dee, PT OT Present: Cherylynn Ridges, OT SLP Present: Windell Moulding, SLP PPS Coordinator present : Daiva Nakayama, RN, CRRN     Current Status/Progress Goal Weekly Team Focus  Medical     pain managed   medically stability     Bowel/Bladder        cont B & B     Swallow/Nutrition/ Hydration             ADL's   Mod I/supervision   mod I for functional tasks (should live with 24 hr supervision due to memory)  Pt/family education, activity tolerance, pain management   Mobility   supervision/set up  supervision/set up  d/c wednesday     Communication             Safety/Cognition/ Behavioral Observations    recommend 24 hr supervision   needs to be monitored due to orientation and confusion     Pain        pain managed     Skin        no skin issues        *See Care Plan and progress notes for long and short-term goals.     Barriers to Discharge  Current Status/Progress Possible Resolutions Date Resolved   Physician                    Nursing                  PT                    OT                  SLP                SW                Discharge Planning/Teaching Needs:    Home with caregiver and daughter who is here visiting to assist with her care-24 hr supervision     Team Discussion:  Goals supervision level. Doing well and reaching her goals, son aware of team's recommendations. Medically stable and ready for  DC today.  Revisions to Treatment Plan:  DC 9/12       [name] 08/10/2017, 1:23 PM

## 2017-08-09 NOTE — Progress Notes (Signed)
Occupational Therapy Discharge Summary  Patient Details  Name: Charlene Duncan MRN: [id number] Date of Birth: 02/18/1936  Today's Date: 08/09/2017 OT Individual Time: 1400-1530 OT Individual Time Calculation (min): 90 min   Pt much more anxious this afternoon and in more pain. Informed RN and she administered pain medication. OT treatment session focused on modified bathing/dressing, activity tolerance, safety awareness and pain management. Pt collected clothing with supervision and RW. She needed min cues for RW placement when turning to sit onto tub bench. Pt able to wash 10/10 body parts without assistance. UB and LB dressing completed mod I  With increased time 2/2 painful R LE. B UE coordination and standing balance/endurance with hair blow drying task. Pt with no LOB during grooming activity and demonstrated safe use of RW at the sink. Pt then ambulated to therapy apartment and was unable to recall practicing tub transfer yesterday using tub transfer bench. OT demonstrated tub shower transfer with tub bench and then pt completed transfer with encouragement and supervision/verbal cues 2/2 unfamiliar task and new environment. Discussed OT recommendation of tub transfer bench being the only safe tub  Shower transfer for pt to complete at this time, pt reports understanding. Pt ambulated back to room at end of session with RW and transferred back to bed without assistance. Pt left with bed alarm on and needs met.    Patient has met 9 of 10 long term goals due to improved activity tolerance, improved balance, ability to compensate for deficits and improved attention.  Patient to discharge at overall Supervision/ Modified Independent level.  Patient's care partner is independent to provide the necessary cognitive assistance at discharge.    Reasons goals not met: Pt requires set-up A and supervision for bathing 2/2 risk of falls. Recommend obtains tub transfer bench as this is the only safe way for pt  to transfer in and out of tub shower at home.   Recommendation:  Patient will benefit from ongoing skilled OT services in home health setting to continue to advance functional skills in the area of BADL.  Equipment: Rolling Walker, Tub Transfer Bench  Reasons for discharge: treatment goals met and discharge from hospital  Patient/family agrees with progress made and goals achieved: Yes  OT Discharge Precautions/Restrictions  Precautions Precautions: Fall Restrictions Weight Bearing Restrictions: Yes RLE Weight Bearing: Weight bearing as tolerated LLE Weight Bearing: Weight bearing as tolerated Pain Pain Assessment Pain Assessment: 0-10 Pain Score: 8  Pain Type: Acute pain Pain Location: Hip Pain Orientation: Right Pain Descriptors / Indicators: Aching Pain Onset: On-going Pain Intervention(s): Repositioned (RN administered pain meds) ADL ADL Eating: Independent Grooming: Modified independent Upper Body Bathing: Modified independent Where Assessed-Upper Body Bathing: Shower Lower Body Bathing: Modified independent Where Assessed-Lower Body Bathing: Shower Upper Body Dressing: Independent Lower Body Dressing: Modified independent Toileting: Modified independent Toilet Transfer: Modified independent Toilet Transfer Method: Ambulating Tub/Shower Transfer: Close supervison Tub/Shower Transfer Method: Ambulating Tub/Shower Equipment: Transfer tub bench ADL Comments: Please see functional navigator Perception  Perception: Within Functional Limits Praxis Praxis: Intact Cognition Overall Cognitive Status: History of cognitive impairments - at baseline Arousal/Alertness: Awake/alert Orientation Level: Oriented to person;Oriented to situation (oriented to month and year, requires cues for orientation to hospital) Memory: Impaired Awareness: Impaired Problem Solving: Impaired Safety/Judgment: Appears intact Sensation Sensation Light Touch: Appears  Intact Stereognosis: Appears Intact Hot/Cold: Appears Intact Proprioception: Appears Intact Coordination Gross Motor Movements are Fluid and Coordinated: Yes Fine Motor Movements are Fluid and Coordinated: Yes Finger Nose Finger Test: WFL Motor    Motor Motor: Other (comment) Motor - Discharge Observations: decreased strength RLE>LLE due to pain Mobility  Bed Mobility Supine to Sit: 6: Modified independent (Device/Increase time) Sit to Supine: 6: Modified independent (Device/Increase time) Sit to Stand: 6: Modified independent (Device/Increase time) Stand to Sit: 6: Modified independent (Device/Increase time)  Trunk/Postural Assessment  Cervical Assessment Cervical Assessment: Within Functional Limits Thoracic Assessment Thoracic Assessment: Within Functional Limits Lumbar Assessment Lumbar Assessment: Within Functional Limits  Balance Balance Balance Assessed: Yes Static Sitting Balance Static Sitting - Level of Assistance: 6: Modified independent (Device/Increase time) Dynamic Sitting Balance Dynamic Sitting - Level of Assistance: 6: Modified independent (Device/Increase time) Static Standing Balance Static Standing - Level of Assistance: 6: Modified independent (Device/Increase time) Dynamic Standing Balance Dynamic Standing - Level of Assistance: 6: Modified independent (Device/Increase time) Extremity/Trunk Assessment RUE Assessment RUE Assessment: Within Functional Limits LUE Assessment LUE Assessment: Within Functional Limits   See Function Navigator for Current Functional Status.  Milam Allbaugh S Shaletha Humble 08/09/2017, 7:25 PM  

## 2017-08-09 NOTE — Progress Notes (Addendum)
Occupational Therapy Session Note  Patient Details  Name: Charlene Duncan MRN: [id number] Date of Birth: 09/12/1936  Today's Date: 08/09/2017 OT Individual Time: 1607-3710 OT Individual Time Calculation (min): 30 min   Short Term Goals: Week 1:  OT Short Term Goal 1 (Week 1): STG=LTG  Skilled Therapeutic Interventions/Progress Updates:  Session 1   Entered pt room this am and pt laying supine in bed. Pt reports feeling "weakish" this morning and feels she needs to rest. Encouraged pt to participate in therapy by explaining correlation for going home, OT goals and plan of care. Pt states " I want to go home, but I just don't feel like I can do much right now." Explained importance of nutrition for energy and pt agreeable to try to sit EOB for breakfast. Pt completed sup<>sit with supervision and increased time. Pt attempted 1 bite of food but stated she wasn't hungry and couldn't continue. OT discussed energy conservation strategies within home management and self-care tasks-pt verbalized understanding.Pt again requested to rest despite encouragement. Pt returned to supine with supervision and left with needs met.   Therapy Documentation Precautions:  Precautions Precautions: Fall Restrictions Weight Bearing Restrictions: Yes RLE Weight Bearing: Weight bearing as tolerated LLE Weight Bearing: Weight bearing as tolerated Pain: Pain Assessment Pain Assessment: 0-10 Pain Score: 8  Pain Type: Acute pain Pain Location: Back Pain Descriptors / Indicators: Aching Pain Onset: On-going Pain Intervention(s): Repositioned ADL: ADL ADL Comments: see functional navigator  See Function Navigator for Current Functional Status.   Therapy/Group: Individual Therapy  Valma Cava 08/09/2017, 3:44 PM

## 2017-08-10 ENCOUNTER — Encounter (HOSPITAL_COMMUNITY): Payer: Self-pay | Admitting: Physical Medicine and Rehabilitation

## 2017-08-10 DIAGNOSIS — I10 Essential (primary) hypertension: Secondary | ICD-10-CM

## 2017-08-10 HISTORY — DX: Essential (primary) hypertension: I10

## 2017-08-10 MED ORDER — TRAMADOL HCL 50 MG PO TABS: 50.0000 mg | ORAL_TABLET | Freq: Four times a day (QID) | ORAL | 0 refills | Status: AC | PRN

## 2017-08-10 MED ORDER — AMLODIPINE BESYLATE 2.5 MG PO TABS: 2.5000 mg | ORAL_TABLET | Freq: Every day | ORAL | 0 refills | Status: AC

## 2017-08-10 NOTE — Discharge Instructions (Signed)
Inpatient Rehab Discharge Instructions  Charlene Duncan Discharge date and time: 08/10/17   Activities/Precautions/ Functional Status: Activity: activity as tolerated Diet: regular diet Increase fluid intake--you are getting dehydrated.  Wound Care: none needed   Functional status:  ___ No restrictions     ___ Walk up steps independently __X_ 24/7 supervision/assistance   ___ Walk up steps with assistance ___ Intermittent supervision/assistance  ___ Bathe/dress independently ___ Walk with walker     ___ Bathe/dress with assistance ___ Walk Independently    ___ Shower independently ___ Walk with assistance    _X__ Shower with supervision _X__ No alcohol     ___ Return to work/school ________   Special Instructions: 1. Needs assistance with medication management. Recommend using a pill box.  2. Offer fluids frequently thorough out the day.     COMMUNITY REFERRALS UPON DISCHARGE:    Home Health:   Learned [id number]:[phone]   Date of last service:08/10/2017  Medical Equipment/Items [place]     My questions have been answered and I understand these instructions. I will adhere to these goals and the provided educational materials after my discharge from the hospital.  Patient/Caregiver Signature _______________________________ Date __________  Clinician Signature _______________________________________ Date __________  Please bring this form and your medication list with you to all your follow-up doctor's appointments.

## 2017-08-10 NOTE — Discharge Summary (Signed)
Physician Discharge Summary  Patient ID: Charlene Duncan MRN: [id number] DOB/AGE: 04/19/1936 81 y.o.  Admit date: 08/02/2017 Discharge date: 08/10/2017  Discharge Diagnoses:  Principal Problem:   Closed fracture of multiple pubic rami, right, sequela Active Problems:   Alzheimer disease   Compression fracture of first lumbar vertebra (HCC)   HTN (hypertension)   Discharged Condition: stable.   Significant Diagnostic Studies:   Labs:  Basic Metabolic Panel: BMP Latest Ref Rng & Units 08/09/2017 08/03/2017 08/01/2017  Glucose 65 - 99 mg/dL 86 103(H) 95  BUN 6 - 20 mg/dL 29(H) 23(H) 21(H)  Creatinine 0.44 - 1.00 mg/dL 0.82 0.96 0.83  Sodium 135 - 145 mmol/L 135 138 137  Potassium 3.5 - 5.1 mmol/L 3.9 3.6 3.6  Chloride 101 - 111 mmol/L 97(L) 104 102  CO2 22 - 32 mmol/L 31 28 28   Calcium 8.9 - 10.3 mg/dL 9.0 8.9 8.9    CBC: CBC Latest Ref Rng & Units 08/09/2017 08/03/2017 08/01/2017  WBC 4.0 - 10.5 K/uL 9.5 7.9 9.1  Hemoglobin 12.0 - 15.0 g/dL 10.5(L) 10.0(L) 10.1(L)  Hematocrit 36.0 - 46.0 % 33.7(L) 31.4(L) 31.8(L)  Platelets 150 - 400 K/uL 455(H) 286 262    CBG: No results for input(s): GLUCAP in the last 168 hours.  Brief HPI:   Charlene Duncan a 81 y.o.femalewith dementia, RA who lives with intermittent assistance at the home, who fell at home on 07/31/17 suffering acute fractures of the right puc rami as well as an L1 compression fracture. Imaging was also notable for multiple chronic lumbar and thoracic compression fractures. Pt has struggled with mobility due to pain and lower extremity weakness. PM&R was consulted to assess for rehab needs and felt that the patient would benefit from an inpatient rehab admission. Pt is admitted today to maximize functional gains   Hospital Course: Charlene Duncan was admitted to rehab 08/02/2017 for inpatient therapies to consist of PT and OT at least three hours five days a week. Past admission physiatrist, therapy team and rehab RN  have worked together to provide customized collaborative inpatient rehab. Pelvic pain has been managed with use of ultram and/or tylenol. Follow up labs showed that ABLA is resolving and platelets are stable.  She continues to have issues with poor fluid intake with rise in BUN and her aide was instructed to continue to encourage fluid intake after discharge. Her anxiety levels have improved with ego support provided by the team. Blood pressures were noted to be trending upwards and low dose amlodipine was added with good results. Family was educated about need for 24 hours supervision recommended due to dementia, safety awareness, medication compliance as well as history of multiple falls.  She is currently at supervision level and needs to use RW for safety. She will continue to receive follow up HHPT and [facility] after discharge.      Rehab course: During patient's stay in rehab weekly team conferences were held to monitor patient's progress, set goals and discuss barriers to discharge. At admission, patient required mod assist with basic self-care tasks and min assist with mobility. She has had improvement in activity tolerance, balance, postural control, as well as ability to compensate for deficits. She requires supervision to gather items for B/D and is able to complete ADL tasks at modified independent level. She is modified independent for transfers and is able to ambulate 150; with RW and supervision. She requires supervision to climb 8 stairs.   Disposition: Home.   Diet: Regular.  Special Instructions: 1. Needs 24 hours supervision.  2. Offer fluids frequently thorough out the day.  3. Needs BMET rechecked in 1-2 weeks.    Discharge Instructions    Ambulatory referral to Physical Medicine Rehab    Complete by:  As directed    1-2 weeks transitional care appt     Discharge Medication List as of 08/10/2017 10:54 AM    START taking these medications   Details   amLODipine (NORVASC) 2.5 MG tablet Take 1 tablet (2.5 mg total) by mouth daily., Starting Thu 08/11/2017, Normal      CONTINUE these medications which have CHANGED   Details  traMADol (ULTRAM) 50 MG tablet Take 1 tablet (50 mg total) by mouth every 6 (six) hours as needed for severe pain., Starting Wed 08/10/2017, Print--Rx # 60 pills      CONTINUE these medications which have NOT CHANGED   Details  acetaminophen (TYLENOL) 325 MG tablet Take 2 tablets (650 mg total) by mouth every 6 (six) hours as needed for mild pain (or Fever >/= 101)., Starting Tue 08/02/2017, No Print    Cholecalciferol (VITAMIN D PO) Take 1 tablet by mouth daily., Historical Med    donepezil (ARICEPT) 5 MG tablet TAKE 1 TABLET BY MOUTH AT BEDTIME, Normal    DULoxetine (CYMBALTA) 30 MG capsule Take 30 mg by mouth daily. , Starting Sat 06/04/2017, Historical Med    Iron-FA-B Cmp-C-Biot-Probiotic (FUSION PLUS PO) Take 1 capsule by mouth daily., Historical Med    levothyroxine (SYNTHROID, LEVOTHROID) 88 MCG tablet Take 88 mcg by mouth every other day. , Starting Mon 03/21/2017, Historical Med    methotrexate 2.5 MG tablet Take 10 mg by mouth once a week. THURSDAYS, Starting Mon 07/11/2017, Historical Med    predniSONE (DELTASONE) 10 MG tablet Take 5 mg by mouth daily. , Starting Tue 06/07/2017, Historical Med    vitamin B-12 (CYANOCOBALAMIN) 500 MCG tablet Take 500 mcg by mouth daily., Historical Med      STOP taking these medications     acetaZOLAMIDE (DIAMOX) 250 MG tablet      celecoxib (CELEBREX) 200 MG capsule        Follow-up Information    [name], MD Follow up on 08/17/2017.   Specialty:  Family Medicine Why:  Appointment @ 11:00 Am Contact information: [place] [address] [phone]        Charlett Blake, MD Follow up.   Specialty:  Physical Medicine and Rehabilitation Why:  office will call you with follow up appointment Contact information: [place] [address] [phone]        Brooks, [place], MD. Call in 1 day(s).   Specialty:  Orthopedic Surgery Why:  for follow up appointment Contact information: [address] [place] [address] [phone]        Gavin Pound, MD. Call.   Specialty:  Rheumatology Why:  for follow up appointment Contact information: [place] [address] [phone]           Signed: Bary Leriche 08/10/2017, 3:02 PM

## 2017-08-10 NOTE — Progress Notes (Signed)
Social Work Seaira Byus, Eliezer Champagne Social Worker Signed   Patient Care Conference Date of Service: 08/09/2017 10:58 AM      Hide copied text Hover for attribution information Inpatient RehabilitationTeam Conference and Plan of Care Update Date: 08/10/2017   Time: 11:00 AM      Patient Name: Charlene Duncan      Medical Record Number: [id number]  Date of Birth: February 26, 1936 Sex: Female         Room/Bed: 4M01C/4M01C-01 Payor Info: Payor: MEDICARE / Plan: MEDICARE PART A AND B / Product Type: *No Product type* /     Admitting Diagnosis: pelvic fx  Admit Date/Time:  08/02/2017  6:13 PM Admission Comments: No comment available    Primary Diagnosis:  Closed fracture of multiple pubic rami, right, sequela Principal Problem: Closed fracture of multiple pubic rami, right, sequela       Patient Active Problem List    Diagnosis Date Noted  . HTN (hypertension) 08/10/2017  . Lumbar compression fracture ([place]) 08/02/2017  . Closed fracture of multiple pubic rami, right, sequela    . Anemia 08/01/2017  . Hyperglycemia 08/01/2017  . Compression fracture of first lumbar vertebra ([place]) 07/31/2017  . Alzheimer disease 05/16/2017  . Arthritis 06/16/2014  . Congenital hypothyroidism without goiter 06/16/2014  . History of breast cancer 06/16/2014  . Hyperlipidemia 06/16/2014  . Iron deficiency 06/16/2014      Expected Discharge Date: Expected Discharge Date: 08/10/17   Team Members Present: Physician leading conference: Dr. Alysia Penna Social Worker Present: Ovidio Kin, LCSW Nurse Present: Dorien Chihuahua, RN PT Present: Dwyane Dee, PT OT Present: Cherylynn Ridges, OT SLP Present: Windell Moulding, SLP PPS Coordinator present : Daiva Nakayama, RN, CRRN       Current Status/Progress Goal Weekly Team Focus  Medical       pain managed   medically stability     Bowel/Bladder          cont B & B     Swallow/Nutrition/ Hydration               ADL's     Mod I/supervision   mod I for  functional tasks (should live with 24 hr supervision due to memory)  Pt/family education, activity tolerance, pain management   Mobility     supervision/set up  supervision/set up  d/c wednesday    Communication               Safety/Cognition/ Behavioral Observations     recommend 24 hr supervision   needs to be monitored due to orientation and confusion     Pain          pain managed     Skin          no skin issues       *See Care Plan and progress notes for long and short-term goals.      Barriers to Discharge   Current Status/Progress Possible Resolutions Date Resolved   Physician                    Nursing                 PT                    OT                 SLP            SW  Discharge Planning/Teaching Needs:    Home with caregiver and daughter who is here visiting to assist with her care-24 hr supervision     Team Discussion:  Goals supervision level. Doing well and reaching her goals, son aware of team's recommendations. Medically stable and ready for DC today.  Revisions to Treatment Plan:  DC 9/12      [name] 08/10/2017, 1:23 PM          Daphene Chisholm, Gardiner Rhyme, LCSW Social Worker Signed   Patient Care Conference Date of Service: 08/03/2017  2:01 PM      Hide copied text Hover for attribution information Inpatient RehabilitationTeam Conference and Plan of Care Update Date: 08/03/2017   Time: 11:30 Am      Patient Name: Charlene Duncan      Medical Record Number: [id number]  Date of Birth: 03-01-1936 Sex: Female         Room/Bed: 4M01C/4M01C-01 Payor Info: Payor: MEDICARE / Plan: MEDICARE PART A AND B / Product Type: *No Product type* /     Admitting Diagnosis: pelvic fx  Admit Date/Time:  08/02/2017  6:13 PM Admission Comments: No comment available    Primary Diagnosis:  <principal problem not specified> Principal Problem: <principal problem not specified>       Patient Active Problem List    Diagnosis Date Noted  .  Lumbar compression fracture ([place]) 08/02/2017  . Closed fracture of multiple pubic rami, right, sequela    . Anemia 08/01/2017  . Hyperglycemia 08/01/2017  . Compression fracture of first lumbar vertebra ([place]) 07/31/2017  . Alzheimer disease 05/16/2017  . Arthritis 06/16/2014  . Congenital hypothyroidism without goiter 06/16/2014  . History of breast cancer 06/16/2014  . Hyperlipidemia 06/16/2014  . Iron deficiency 06/16/2014      Expected Discharge Date:     Team Members Present: Physician leading conference: Dr. Alysia Penna Social Worker Present: Ovidio Kin, LCSW Nurse Present: Arelia Sneddon, RN PT Present: Dwyane Dee, PT OT Present: Cherylynn Ridges, OT SLP Present: Weston Anna, SLP PPS Coordinator present : Daiva Nakayama, RN, CRRN       Current Status/Progress Goal Weekly Team Focus  Medical       adjusting meds-monitoring pain    medical  stability     Bowel/Bladder     Continent. Uses pad for leaking.  For patient to continue to be continent.  Assist with toileting as needed.   Swallow/Nutrition/ Hydration               ADL's       eval pending        Mobility       eval pending        Communication               Safety/Cognition/ Behavioral Observations             Pain     Occassional hip pain at 5 or 6.  <3.  Treat pain as needed.   Skin     Bruising in various places.  Maintain skin integrity.  Keep patient from getting up without assisstance.     *See Care Plan and progress notes for long and short-term goals.      Barriers to Discharge   Current Status/Progress Possible Resolutions Date Resolved   Physician                    Nursing   Incontinence  PT  Decreased caregiver support;Home environment access/layout;Lack of/limited family support;Behavior  Baseline dementia.               OT                 SLP            SW              Discharge Planning/Teaching Needs:    Home with added services via private duty  aide. Son here today to observe in therapies.     Team Discussion:  Being evaluated today and goals being set. Possibly supervision level for safety. Somewhat confused today with move in rooms and units.  Revisions to Treatment Plan:  New evaluation      [name] 08/03/2017, 2:01 PM           Patient ID: Darl Householder, female   DOB: 1936/06/22, 81 y.o.   MRN: [id number]

## 2017-08-10 NOTE — Progress Notes (Signed)
Subjective/Complaints:  Pt unaware of d/c date. RIght hip pain with movement only  ROS:  No N/V/D, neg abd pain  Objective: Vital Signs: Blood pressure 121/68, pulse 85, temperature 98.6 F (37 C), temperature source Oral, resp. rate 17, height 5' (1.524 m), weight 47.6 kg (105 lb), SpO2 98 %. No results found. Results for orders placed or performed during the hospital encounter of 08/02/17 (from the past 72 hour(s))  CBC     Status: Abnormal   Collection Time: 08/09/17  5:31 AM  Result Value Ref Range   WBC 9.5 4.0 - 10.5 K/uL   RBC 3.34 (L) 3.87 - 5.11 MIL/uL   Hemoglobin 10.5 (L) 12.0 - 15.0 g/dL   HCT 42.4 (L) 24.4 - 45.3 %   MCV 100.9 (H) 78.0 - 100.0 fL   MCH 31.4 26.0 - 34.0 pg   MCHC 31.2 30.0 - 36.0 g/dL   RDW 67.6 (H) 11.4 - 11.2 %   Platelets 455 (H) 150 - 400 K/uL  Basic metabolic panel     Status: Abnormal   Collection Time: 08/09/17  5:31 AM  Result Value Ref Range   Sodium 135 135 - 145 mmol/L   Potassium 3.9 3.5 - 5.1 mmol/L   Chloride 97 (L) 101 - 111 mmol/L   CO2 31 22 - 32 mmol/L   Glucose, Bld 86 65 - 99 mg/dL   BUN 29 (H) 6 - 20 mg/dL   Creatinine, Ser 5.93 0.44 - 1.00 mg/dL   Calcium 9.0 8.9 - 27.2 mg/dL   GFR calc non Af Amer >60 >60 mL/min   GFR calc Af Amer >60 >60 mL/min    Comment: (NOTE) The eGFR has been calculated using the CKD EPI equation. This calculation has not been validated in all clinical situations. eGFR's persistently <60 mL/min signify possible Chronic Kidney Disease.    Anion gap 7 5 - 15     HEENT: normal Cardio: RRR and no murmur Resp: CTA B/L and unlabored GI: BS positive and NT, ND Extremity:  No Edema Skin:   Intact Neuro: Confused,  and Abnormal Motor 5/5 in Bilateral delt Bi tri grip Left HF, KE, ankle DF 4- Right HF/KE limited by pain, 5/5 in R ankle DF/PF Musc/Skel:  Other pain with Right LE movement in Right groin area Gen NAD   Assessment/Plan: 1. Functional deficits secondary to right pubic ramus fracture   Stable for D/C today F/u PCP in 3-4 weeks F/u PM&R 2 weeks See D/C summary See D/C instructions FIM: Function - Bathing Position: Shower Body parts bathed by patient: Left arm, Right arm, Chest, Abdomen, Buttocks, Front perineal area, Right lower leg, Left lower leg, Right upper leg, Left upper leg Body parts bathed by helper: Back Assist Level: No help, No cues  Function- Upper Body Dressing/Undressing What is the patient wearing?: Pull over shirt/dress Pull over shirt/dress - Perfomed by patient: Thread/unthread right sleeve, Put head through opening, Thread/unthread left sleeve, Pull shirt over trunk Assist Level: No help, No cues Function - Lower Body Dressing/Undressing What is the patient wearing?: Underwear, Pants, Shoes, Socks Position: Sitting EOB Underwear - Performed by patient: Thread/unthread right underwear leg, Thread/unthread left underwear leg, Pull underwear up/down Underwear - Performed by helper: Thread/unthread right underwear leg, Thread/unthread left underwear leg Pants- Performed by patient: Thread/unthread right pants leg, Thread/unthread left pants leg, Pull pants up/down Pants- Performed by helper: Thread/unthread right pants leg, Thread/unthread left pants leg Socks - Performed by patient: Don/doff right sock, Don/doff left sock  Socks - Performed by helper: Don/doff right sock, Don/doff left sock Shoes - Performed by patient: Fasten right, Don/doff right shoe, Don/doff left shoe, Fasten left Shoes - Performed by helper: Don/doff right shoe, Don/doff left shoe, Fasten left, Fasten right TED Hose - Performed by helper: Don/doff left TED hose, Don/doff right TED hose Assist for footwear: Independent Assist for lower body dressing: More than reasonable time  Function - Toileting Toileting steps completed by patient: Adjust clothing prior to toileting, Performs perineal hygiene, Adjust clothing after toileting Toileting steps completed by helper: Adjust  clothing prior to toileting, Adjust clothing after toileting Toileting Assistive Devices: Grab bar or rail Assist level: No help/no cues  Function Midwife transfer assistive device: Grab bar Assist level to toilet: No Help, no cues, assistive device, takes more than a reasonable amount of time Assist level from toilet: No Help, no cues, assistive device, takes more than a reasonable amount of time  Function - Chair/bed transfer Chair/bed transfer method: Stand pivot Chair/bed transfer assist level: No Help, no cues, assistive device, takes more than a reasonable amount of time Chair/bed transfer assistive device: Armrests, Walker  Function - Locomotion: Wheelchair Will patient use wheelchair at discharge?: No Type: Manual Max wheelchair distance: 120 Assist Level: Supervision or verbal cues Assist Level: Supervision or verbal cues Wheel 150 feet activity did not occur: Safety/medical concerns Turns around,maneuvers to table,bed, and toilet,negotiates 3% grade,maneuvers on rugs and over doorsills: No Function - Locomotion: Ambulation Assistive device: Walker-rolling Max distance: 150 ft Assist level: Supervision or verbal cues Assist level: Supervision or verbal cues Assist level: Supervision or verbal cues Walk 150 feet activity did not occur: Safety/medical concerns Assist level: Supervision or verbal cues Walk 10 feet on uneven surfaces activity did not occur: Safety/medical concerns Assist level: Supervision or verbal cues  Function - Comprehension Comprehension: Auditory Comprehension assistive device: Hearing aids Comprehension assist level: Understands basic 90% of the time/cues < 10% of the time  Function - Expression Expression: Verbal Expression assist level: Expresses basic 90% of the time/requires cueing < 10% of the time.  Function - Social Interaction Social Interaction assist level: Interacts appropriately 90% of the time - Needs monitoring  or encouragement for participation or interaction.  Function - Problem Solving Problem solving assist level: Solves basic 90% of the time/requires cueing < 10% of the time  Function - Memory Memory assist level: Recognizes or recalls 90% of the time/requires cueing < 10% of the time Patient normally able to recall (first 3 days only): That he or she is in a hospital, Current season  Medical Problem List and Plan: 1. Functional deficits and gait disordersecondary to right pubic rami fracture, L1 compression fracture D/C today   2. DVT Prophylaxis/Anticoagulation: Pharmaceutical: Lovenox'30mg'$  sq daily 3. Pain Management: tylenol and celebrex prn.  -tramadol for more severe pain 4. Mood: cymbalta per home regimen. Mood seems up beat -she's anxious to return home 5. Neuropsych: This patient iscapable of making decisions on herown behalf. -hx Alzheimer's dementia Aricept '10mg'$  qhs 6. Skin/Wound Care: local care, optimize nutrition 7. Fluids/Electrolytes/Nutrition: encourage PO -protein supps -check admission labs 8. RA: continue prednisone per baseline '5mg'$  daily -MTX '10mg'$  weekly on Thursdays 9. Hypothyroid: synthroid 44mg daily 10.  Hypertension-  amlodipine '5mg'$ , controlled Vitals:   08/09/17 1603 08/10/17 0633  BP: (!) 112/55 121/68  Pulse: 84 85  Resp: 17   Temp: 98.3 F (36.8 C) 98.6 F (37 C)  SpO2: 95% 98%   11.  Hypoalb- start  prostat 12.  Hx GERD with epigastric discomfort will write for prn TUMs  , amb 100' in PT asymptomatic    LOS (Days) 8 A FACE TO FACE EVALUATION WAS PERFORMED  Regis Hinton E 08/10/2017, 7:53 AM

## 2017-08-10 NOTE — Plan of Care (Signed)
Problem: RH PAIN MANAGEMENT Goal: RH STG PAIN MANAGED AT OR BELOW PT'S PAIN GOAL <4 out of 10.   Outcome: Completed/Met Date Met: 08/10/17 Patient states current pain regimen is effective for her, although she rates pain level at 8-9 consistantly.

## 2017-08-11 ENCOUNTER — Telehealth: Payer: Self-pay | Admitting: *Deleted

## 2017-08-11 DIAGNOSIS — S32059D Unspecified fracture of fifth lumbar vertebra, subsequent encounter for fracture with routine healing: Secondary | ICD-10-CM | POA: Diagnosis not present

## 2017-08-11 DIAGNOSIS — Z853 Personal history of malignant neoplasm of breast: Secondary | ICD-10-CM | POA: Diagnosis not present

## 2017-08-11 DIAGNOSIS — G309 Alzheimer's disease, unspecified: Secondary | ICD-10-CM | POA: Diagnosis not present

## 2017-08-11 DIAGNOSIS — Z9181 History of falling: Secondary | ICD-10-CM | POA: Diagnosis not present

## 2017-08-11 DIAGNOSIS — F0390 Unspecified dementia without behavioral disturbance: Secondary | ICD-10-CM | POA: Diagnosis not present

## 2017-08-11 DIAGNOSIS — F0280 Dementia in other diseases classified elsewhere without behavioral disturbance: Secondary | ICD-10-CM | POA: Diagnosis not present

## 2017-08-11 DIAGNOSIS — E785 Hyperlipidemia, unspecified: Secondary | ICD-10-CM | POA: Diagnosis not present

## 2017-08-11 DIAGNOSIS — I10 Essential (primary) hypertension: Secondary | ICD-10-CM | POA: Diagnosis not present

## 2017-08-11 DIAGNOSIS — M069 Rheumatoid arthritis, unspecified: Secondary | ICD-10-CM | POA: Diagnosis not present

## 2017-08-11 DIAGNOSIS — S32501D Unspecified fracture of right pubis, subsequent encounter for fracture with routine healing: Secondary | ICD-10-CM | POA: Diagnosis not present

## 2017-08-11 DIAGNOSIS — S32010D Wedge compression fracture of first lumbar vertebra, subsequent encounter for fracture with routine healing: Secondary | ICD-10-CM | POA: Diagnosis not present

## 2017-08-11 NOTE — Telephone Encounter (Signed)
Contacted patient's son Dayane Hillenburg who is listed as emergency contact, Transitional care call completed, Appointment confirmed, address confirmed, new patient packet mailed  Transitional Care Questions   Questions for our staff to ask patients on Transitional care 48 hour phone call:   1. Are you/is patient experiencing any problems since coming home? Patient having extreme difficulty getting out of bed due to pain,  EMS was called to assist.  Son questions if his mother came home to soon.  Are there any questions regarding any aspect of care?  No  2. Are there any questions regarding medications administration/dosing? No  Are meds being taken as prescribed? Yes  Patient should review meds with caller to confirm   3. Have there been any falls? No  4. Has Home Health been to the house and/or have they contacted you? HH PT has been to the house  If not, have you tried to contact them? Can we help you contact them?   5. Are bowels and bladder emptying properly? Yes  Are there any unexpected incontinence issues? No If applicable, is patient following bowel/bladder programs?   6. Any fevers, problems with breathing, unexpected pain? No, No, No unexpected pain  7. Are there any skin problems or new areas of breakdown?  No  8. Has the patient/family member arranged specialty MD follow up (ie cardiology/neurology/renal/surgical/etc)?  Yes Can we help arrange? No  9. Does the patient need any other services or support that we can help arrange?  No  10. Are caregivers following through as expected in assisting the patient? Just the son and his wife, feeling overwhelmed at times, I advised discussing with [facility] or PT about [place] aide to assist  11. Has the patient quit smoking, drinking alcohol, or using drugs as recommended? No drinky, no smoky, no illicit drug use

## 2017-08-11 NOTE — [place] FL2 (Signed)
[place] MEDICAID FL2 LEVEL OF CARE SCREENING TOOL     IDENTIFICATION  Patient Name: Charlene Duncan Birthdate: July 25, 1936 Sex: female Admission Date (Current Location): 08/02/2017  [facility] and [place] Number:  Herbalist and Address:  [facility], [place] [address], [place], [place] [address]      Provider Number: [id number]  Attending Physician Name and Address:  No att. providers found  Relative Name and Phone Number:  Emmaline Wahba [phone]    Current Level of Care: Home Recommended Level of Care: [place] Prior Approval Number:    Date Approved/Denied:   PASRR Number:    Discharge Plan: SNF    Current Diagnoses: Patient Active Problem List   Diagnosis Date Noted  . HTN (hypertension) 08/10/2017  . Lumbar compression fracture ([place]) 08/02/2017  . Closed fracture of multiple pubic rami, right, sequela   . Anemia 08/01/2017  . Hyperglycemia 08/01/2017  . Compression fracture of first lumbar vertebra ([place]) 07/31/2017  . Alzheimer disease 05/16/2017  . Arthritis 06/16/2014  . Congenital hypothyroidism without goiter 06/16/2014  . History of breast cancer 06/16/2014  . Hyperlipidemia 06/16/2014  . Iron deficiency 06/16/2014    Orientation RESPIRATION BLADDER Height & Weight     Self, Place  Normal Continent Weight: 105 lb (47.6 kg) Height:  5' (152.4 cm)  BEHAVIORAL SYMPTOMS/MOOD NEUROLOGICAL BOWEL NUTRITION STATUS      Continent Diet (regular diet)  AMBULATORY STATUS COMMUNICATION OF NEEDS Skin   Supervision Verbally Skin abrasions                       Personal Care Assistance Level of Assistance  Bathing, Dressing Bathing Assistance: Limited assistance   Dressing Assistance: Limited assistance     Functional Limitations Info             SPECIAL CARE FACTORS FREQUENCY  PT (By licensed PT), OT (By licensed OT)     PT Frequency: 5x week OT Frequency: 5x week             Contractures      Additional Factors Info  Allergies   Allergies Info: Naproxen           Current Medications (08/11/2017):  This is the current hospital active medication list No current facility-administered medications for this encounter.    Current Outpatient Prescriptions  Medication Sig Dispense Refill  . acetaminophen (TYLENOL) 325 MG tablet Take 2 tablets (650 mg total) by mouth every 6 (six) hours as needed for mild pain (or Fever >/= 101).    [name] amLODipine (NORVASC) 2.5 MG tablet Take 1 tablet (2.5 mg total) by mouth daily. 30 tablet 0  . Cholecalciferol (VITAMIN D PO) Take 1 tablet by mouth daily.    [name] donepezil (ARICEPT) 5 MG tablet TAKE 1 TABLET BY MOUTH AT BEDTIME (Patient not taking: Reported on 07/31/2017) 30 tablet 2  . DULoxetine (CYMBALTA) 30 MG capsule Take 30 mg by mouth daily.     . Iron-FA-B Cmp-C-Biot-Probiotic (FUSION PLUS PO) Take 1 capsule by mouth daily.    [name] levothyroxine (SYNTHROID, LEVOTHROID) 88 MCG tablet Take 88 mcg by mouth every other day.     . methotrexate 2.5 MG tablet Take 10 mg by mouth once a week. THURSDAYS    . predniSONE (DELTASONE) 10 MG tablet Take 5 mg by mouth daily.     . traMADol (ULTRAM) 50 MG tablet Take 1 tablet (50 mg total) by mouth every 6 (six)  hours as needed for severe pain. 60 tablet 0  . vitamin B-12 (CYANOCOBALAMIN) 500 MCG tablet Take 500 mcg by mouth daily.       Discharge Medications: Please see discharge summary for a list of discharge medications.  Relevant Imaging Results:  Relevant Lab Results:   Additional Information SSN: [id number]  Angelise Petrich, Gardiner Rhyme, LCSW

## 2017-08-15 DIAGNOSIS — I10 Essential (primary) hypertension: Secondary | ICD-10-CM | POA: Diagnosis not present

## 2017-08-15 DIAGNOSIS — M069 Rheumatoid arthritis, unspecified: Secondary | ICD-10-CM | POA: Diagnosis not present

## 2017-08-15 DIAGNOSIS — G309 Alzheimer's disease, unspecified: Secondary | ICD-10-CM | POA: Diagnosis not present

## 2017-08-15 DIAGNOSIS — F0280 Dementia in other diseases classified elsewhere without behavioral disturbance: Secondary | ICD-10-CM | POA: Diagnosis not present

## 2017-08-15 DIAGNOSIS — S32501D Unspecified fracture of right pubis, subsequent encounter for fracture with routine healing: Secondary | ICD-10-CM | POA: Diagnosis not present

## 2017-08-15 DIAGNOSIS — S32010D Wedge compression fracture of first lumbar vertebra, subsequent encounter for fracture with routine healing: Secondary | ICD-10-CM | POA: Diagnosis not present

## 2017-08-16 DIAGNOSIS — F0280 Dementia in other diseases classified elsewhere without behavioral disturbance: Secondary | ICD-10-CM | POA: Diagnosis not present

## 2017-08-16 DIAGNOSIS — G309 Alzheimer's disease, unspecified: Secondary | ICD-10-CM | POA: Diagnosis not present

## 2017-08-16 DIAGNOSIS — S32501D Unspecified fracture of right pubis, subsequent encounter for fracture with routine healing: Secondary | ICD-10-CM | POA: Diagnosis not present

## 2017-08-16 DIAGNOSIS — S32010D Wedge compression fracture of first lumbar vertebra, subsequent encounter for fracture with routine healing: Secondary | ICD-10-CM | POA: Diagnosis not present

## 2017-08-16 DIAGNOSIS — M069 Rheumatoid arthritis, unspecified: Secondary | ICD-10-CM | POA: Diagnosis not present

## 2017-08-16 DIAGNOSIS — I10 Essential (primary) hypertension: Secondary | ICD-10-CM | POA: Diagnosis not present

## 2017-08-17 DIAGNOSIS — S32010A Wedge compression fracture of first lumbar vertebra, initial encounter for closed fracture: Secondary | ICD-10-CM | POA: Diagnosis not present

## 2017-08-17 DIAGNOSIS — E559 Vitamin D deficiency, unspecified: Secondary | ICD-10-CM | POA: Diagnosis not present

## 2017-08-17 DIAGNOSIS — S329XXA Fracture of unspecified parts of lumbosacral spine and pelvis, initial encounter for closed fracture: Secondary | ICD-10-CM | POA: Diagnosis not present

## 2017-08-17 DIAGNOSIS — E039 Hypothyroidism, unspecified: Secondary | ICD-10-CM | POA: Diagnosis not present

## 2017-08-17 DIAGNOSIS — D649 Anemia, unspecified: Secondary | ICD-10-CM | POA: Diagnosis not present

## 2017-08-17 DIAGNOSIS — M069 Rheumatoid arthritis, unspecified: Secondary | ICD-10-CM | POA: Diagnosis not present

## 2017-08-17 DIAGNOSIS — I10 Essential (primary) hypertension: Secondary | ICD-10-CM | POA: Diagnosis not present

## 2017-08-17 DIAGNOSIS — Z09 Encounter for follow-up examination after completed treatment for conditions other than malignant neoplasm: Secondary | ICD-10-CM | POA: Diagnosis not present

## 2017-08-19 DIAGNOSIS — S32501D Unspecified fracture of right pubis, subsequent encounter for fracture with routine healing: Secondary | ICD-10-CM | POA: Diagnosis not present

## 2017-08-19 DIAGNOSIS — I10 Essential (primary) hypertension: Secondary | ICD-10-CM | POA: Diagnosis not present

## 2017-08-19 DIAGNOSIS — S32010D Wedge compression fracture of first lumbar vertebra, subsequent encounter for fracture with routine healing: Secondary | ICD-10-CM | POA: Diagnosis not present

## 2017-08-19 DIAGNOSIS — G309 Alzheimer's disease, unspecified: Secondary | ICD-10-CM | POA: Diagnosis not present

## 2017-08-19 DIAGNOSIS — F0280 Dementia in other diseases classified elsewhere without behavioral disturbance: Secondary | ICD-10-CM | POA: Diagnosis not present

## 2017-08-19 DIAGNOSIS — M069 Rheumatoid arthritis, unspecified: Secondary | ICD-10-CM | POA: Diagnosis not present

## 2017-08-22 ENCOUNTER — Encounter: Payer: Medicare Other | Attending: Physical Medicine & Rehabilitation

## 2017-08-22 ENCOUNTER — Ambulatory Visit (HOSPITAL_BASED_OUTPATIENT_CLINIC_OR_DEPARTMENT_OTHER): Payer: Medicare Other | Admitting: Physical Medicine & Rehabilitation

## 2017-08-22 ENCOUNTER — Encounter: Payer: Self-pay | Admitting: Physical Medicine & Rehabilitation

## 2017-08-22 VITALS — BP 148/72 | HR 82 | Resp 14

## 2017-08-22 DIAGNOSIS — F0280 Dementia in other diseases classified elsewhere without behavioral disturbance: Secondary | ICD-10-CM | POA: Insufficient documentation

## 2017-08-22 DIAGNOSIS — E039 Hypothyroidism, unspecified: Secondary | ICD-10-CM | POA: Diagnosis not present

## 2017-08-22 DIAGNOSIS — M069 Rheumatoid arthritis, unspecified: Secondary | ICD-10-CM | POA: Diagnosis not present

## 2017-08-22 DIAGNOSIS — W1830XD Fall on same level, unspecified, subsequent encounter: Secondary | ICD-10-CM | POA: Diagnosis not present

## 2017-08-22 DIAGNOSIS — Z111 Encounter for screening for respiratory tuberculosis: Secondary | ICD-10-CM | POA: Diagnosis not present

## 2017-08-22 DIAGNOSIS — I10 Essential (primary) hypertension: Secondary | ICD-10-CM | POA: Insufficient documentation

## 2017-08-22 DIAGNOSIS — E7800 Pure hypercholesterolemia, unspecified: Secondary | ICD-10-CM | POA: Diagnosis not present

## 2017-08-22 DIAGNOSIS — G309 Alzheimer's disease, unspecified: Secondary | ICD-10-CM | POA: Diagnosis not present

## 2017-08-22 DIAGNOSIS — S32010D Wedge compression fracture of first lumbar vertebra, subsequent encounter for fracture with routine healing: Secondary | ICD-10-CM | POA: Insufficient documentation

## 2017-08-22 DIAGNOSIS — S32591D Other specified fracture of right pubis, subsequent encounter for fracture with routine healing: Secondary | ICD-10-CM | POA: Insufficient documentation

## 2017-08-22 DIAGNOSIS — S32591S Other specified fracture of right pubis, sequela: Secondary | ICD-10-CM | POA: Diagnosis not present

## 2017-08-22 NOTE — Progress Notes (Signed)
Subjective:    Patient ID: Charlene Duncan, female    DOB: 1936/11/13, 81 y.o.   MRN: [id number]  81 y.o. female with dementia, RA who lives with intermittent assistance at the home, who fell at home on  07/31/17 suffering acute fractures of the right puc rami as well as an L1 compression fracture. Imaging was also notable for multiple chronic lumbar and thoracic compression fractures. Pt has struggled with mobility due to pain and lower extremity weakness.Patient was admitted to rehab 08/02/2017 for inpatient therapies to consist of PT and OT at least three hours five days a week. Past admission physiatrist, therapy team and rehab RN have worked together to provide customized collaborative inpatient rehab. Pelvic pain has been managed with use of ultram and/or tylenol. Follow up labs showed that ABLA is resolving and platelets are stable.  She continues to have issues with poor fluid intake with rise in BUN and her aide was instructed to continue to encourage fluid intake after discharge Admit date: 08/02/2017 Discharge date: 08/10/2017  HPI Patient is living with son 19 7 supervision, her son has taken a lot of time off work and is unable to continue to do so. Patient has home health PT, OT Has followed up with her primary care physician No falls at home. No other new issues but continues to have pain mainly in her right side of the buttocks.  The patient is not taking anything except for Tylenol Her son left out her Tylenol one day and she ended up taking 11 tablets. We discussed that this was far too much. We also discussed that she is to take tramadol for pain. There was some question whether this caused her to have some increased mental status issues. However, we discussed that a half a tablet should be fine for her Pain Inventory Average Pain 8 Pain Right Now 10 My pain is aching  In the last 24 hours, has pain interfered with the following? General activity 3 Relation with others  10 Enjoyment of life 5 What TIME of day is your pain at its worst? all Sleep (in general) Fair  Pain is worse with: unsure Pain improves with: medication Relief from Meds: 6  Mobility walk with assistance use a walker do you drive?  no  Function retired  Neuro/Psych bladder control problems bowel control problems weakness trouble walking  Prior Studies hospital f/u  Physicians involved in your care hospital f/u   Family History  Problem Relation Age of Onset  . Arthritis Mother   . Diabetes Mother   . Cirrhosis Sister   . Cirrhosis Brother    Social History   Social History  . Marital status: Married    Spouse name: N/A  . Number of children: 3  . Years of education: 97   Social History Main Topics  . Smoking status: Never Smoker  . Smokeless tobacco: Never Used  . Alcohol use No  . Drug use: No  . Sexual activity: Not Asked   Other Topics Concern  . None   Social History Narrative   Lives alone   Caffeine use: Drinks coffee (daily)/hot chocolate   Left handed   Past Surgical History:  Procedure Laterality Date  . BREAST LUMPECTOMY    . CATARACT EXTRACTION     Past Medical History:  Diagnosis Date  . Alzheimer disease 05/16/2017  . Anemia   . Breast CA ([place])   . Cancer (HCC)    breast  . High cholesterol   .  HTN (hypertension) 08/10/2017  . Iron deficiency   . Meniere disease   . Multiple falls   . Myalgia   . OA (osteoarthritis) of knee   . Peripheral edema   . RA (rheumatoid arthritis) (HCC)    Dr. Trudie Reed  . Thyroid disease    hypothyroid  . Vitamin B 12 deficiency    BP (!) 148/72 (BP Location: Left Arm, Patient Position: Sitting, Cuff Size: Normal)   Pulse 82   Resp 14   SpO2 98%   Opioid Risk Score:   Fall Risk Score:  `1  Depression screen PHQ 2/9  No flowsheet data found.  Review of Systems  HENT: Negative.   Eyes: Negative.   Respiratory: Negative.   Cardiovascular: Negative.   Gastrointestinal: Positive for  constipation and diarrhea.  Endocrine: Negative.   Genitourinary: Positive for difficulty urinating.  Musculoskeletal: Positive for arthralgias and back pain.  Skin: Negative.   Allergic/Immunologic: Negative.   Neurological: Positive for weakness.  Hematological: Negative.   Psychiatric/Behavioral: Negative.   All other systems reviewed and are negative.      Objective:   Physical Exam  Constitutional: She is oriented to person, place, and time. She appears well-developed and well-nourished.  HENT:  Head: Normocephalic and atraumatic.  Eyes: Pupils are equal, round, and reactive to light. Conjunctivae are normal.  Neck: Normal range of motion.  Cardiovascular: Normal rate, regular rhythm and normal heart sounds.  Exam reveals no friction rub.   No murmur heard. Pulmonary/Chest: Effort normal and breath sounds normal. No respiratory distress. She has no wheezes.  Abdominal: Soft. Bowel sounds are normal. She exhibits no distension. There is no tenderness.  Neurological: She is alert and oriented to person, place, and time. Gait abnormal. Coordination normal.  Ambulates with a walker. No evidence to trigger knee stability. Her standing balance is fair, she is unsteady without holding onto something. 4/5, bilateral deltoid, biceps, wrist grip, hip flexion, knee extension, ankle dorsi flexion  Psychiatric: She has a normal mood and affect.  Nursing note and vitals reviewed.         Assessment & Plan:    1. Right pubic ramus fracture, L1 compression fracture. Still having pain, however, it is more in the gluteal area on the right side, which was not fractured. She has made good progress with her therapy, but continues to require home health PT, OT. 2. Improved balance. The patient is limited by her pre-existing Alzheimer's dementia and she is in able to stay by herself. She needs supervision for meds, meals, as well as bathing I agree with assisted-living and discussed this with  the patient as well as her son.  She will need follow-up with orthopedics, Dr. Rolena Infante at [facility]  She will follow up with her primary care physician, Dr. [name] at [facility]  She has a rheumatology follow-up this week and a neurology follow-up in November  No physical medicine and rehabilitation follow-up needed

## 2017-08-22 NOTE — Patient Instructions (Addendum)
Try tramadol 1/2 tablet up to 4 times a day  I agree with assisted living  Follow up with Dr Orland Mustard  Follow-up with Dr. Rolena Infante from [facility] orthopedics

## 2017-08-23 DIAGNOSIS — M069 Rheumatoid arthritis, unspecified: Secondary | ICD-10-CM | POA: Diagnosis not present

## 2017-08-23 DIAGNOSIS — S32010D Wedge compression fracture of first lumbar vertebra, subsequent encounter for fracture with routine healing: Secondary | ICD-10-CM | POA: Diagnosis not present

## 2017-08-23 DIAGNOSIS — G309 Alzheimer's disease, unspecified: Secondary | ICD-10-CM | POA: Diagnosis not present

## 2017-08-23 DIAGNOSIS — S32501D Unspecified fracture of right pubis, subsequent encounter for fracture with routine healing: Secondary | ICD-10-CM | POA: Diagnosis not present

## 2017-08-23 DIAGNOSIS — I10 Essential (primary) hypertension: Secondary | ICD-10-CM | POA: Diagnosis not present

## 2017-08-23 DIAGNOSIS — F0280 Dementia in other diseases classified elsewhere without behavioral disturbance: Secondary | ICD-10-CM | POA: Diagnosis not present

## 2017-08-24 DIAGNOSIS — M0609 Rheumatoid arthritis without rheumatoid factor, multiple sites: Secondary | ICD-10-CM | POA: Diagnosis not present

## 2017-08-24 DIAGNOSIS — R5382 Chronic fatigue, unspecified: Secondary | ICD-10-CM | POA: Diagnosis not present

## 2017-08-24 DIAGNOSIS — R636 Underweight: Secondary | ICD-10-CM | POA: Diagnosis not present

## 2017-08-24 DIAGNOSIS — R768 Other specified abnormal immunological findings in serum: Secondary | ICD-10-CM | POA: Diagnosis not present

## 2017-08-24 DIAGNOSIS — Z681 Body mass index (BMI) 19 or less, adult: Secondary | ICD-10-CM | POA: Diagnosis not present

## 2017-08-24 DIAGNOSIS — M2550 Pain in unspecified joint: Secondary | ICD-10-CM | POA: Diagnosis not present

## 2017-08-24 DIAGNOSIS — R531 Weakness: Secondary | ICD-10-CM | POA: Diagnosis not present

## 2017-08-25 DIAGNOSIS — F0280 Dementia in other diseases classified elsewhere without behavioral disturbance: Secondary | ICD-10-CM | POA: Diagnosis not present

## 2017-08-25 DIAGNOSIS — S32010D Wedge compression fracture of first lumbar vertebra, subsequent encounter for fracture with routine healing: Secondary | ICD-10-CM | POA: Diagnosis not present

## 2017-08-25 DIAGNOSIS — G309 Alzheimer's disease, unspecified: Secondary | ICD-10-CM | POA: Diagnosis not present

## 2017-08-25 DIAGNOSIS — S32501D Unspecified fracture of right pubis, subsequent encounter for fracture with routine healing: Secondary | ICD-10-CM | POA: Diagnosis not present

## 2017-08-25 DIAGNOSIS — M069 Rheumatoid arthritis, unspecified: Secondary | ICD-10-CM | POA: Diagnosis not present

## 2017-08-25 DIAGNOSIS — I10 Essential (primary) hypertension: Secondary | ICD-10-CM | POA: Diagnosis not present

## 2017-08-26 DIAGNOSIS — G309 Alzheimer's disease, unspecified: Secondary | ICD-10-CM | POA: Diagnosis not present

## 2017-08-26 DIAGNOSIS — F0280 Dementia in other diseases classified elsewhere without behavioral disturbance: Secondary | ICD-10-CM | POA: Diagnosis not present

## 2017-08-26 DIAGNOSIS — M069 Rheumatoid arthritis, unspecified: Secondary | ICD-10-CM | POA: Diagnosis not present

## 2017-08-26 DIAGNOSIS — S32010D Wedge compression fracture of first lumbar vertebra, subsequent encounter for fracture with routine healing: Secondary | ICD-10-CM | POA: Diagnosis not present

## 2017-08-26 DIAGNOSIS — S32501D Unspecified fracture of right pubis, subsequent encounter for fracture with routine healing: Secondary | ICD-10-CM | POA: Diagnosis not present

## 2017-08-26 DIAGNOSIS — I10 Essential (primary) hypertension: Secondary | ICD-10-CM | POA: Diagnosis not present

## 2017-08-31 DIAGNOSIS — M069 Rheumatoid arthritis, unspecified: Secondary | ICD-10-CM | POA: Diagnosis not present

## 2017-08-31 DIAGNOSIS — S32501D Unspecified fracture of right pubis, subsequent encounter for fracture with routine healing: Secondary | ICD-10-CM | POA: Diagnosis not present

## 2017-08-31 DIAGNOSIS — S32010D Wedge compression fracture of first lumbar vertebra, subsequent encounter for fracture with routine healing: Secondary | ICD-10-CM | POA: Diagnosis not present

## 2017-08-31 DIAGNOSIS — G309 Alzheimer's disease, unspecified: Secondary | ICD-10-CM | POA: Diagnosis not present

## 2017-08-31 DIAGNOSIS — F0280 Dementia in other diseases classified elsewhere without behavioral disturbance: Secondary | ICD-10-CM | POA: Diagnosis not present

## 2017-08-31 DIAGNOSIS — I10 Essential (primary) hypertension: Secondary | ICD-10-CM | POA: Diagnosis not present

## 2017-09-01 DIAGNOSIS — I10 Essential (primary) hypertension: Secondary | ICD-10-CM | POA: Diagnosis not present

## 2017-09-01 DIAGNOSIS — M069 Rheumatoid arthritis, unspecified: Secondary | ICD-10-CM | POA: Diagnosis not present

## 2017-09-01 DIAGNOSIS — S32010D Wedge compression fracture of first lumbar vertebra, subsequent encounter for fracture with routine healing: Secondary | ICD-10-CM | POA: Diagnosis not present

## 2017-09-01 DIAGNOSIS — G309 Alzheimer's disease, unspecified: Secondary | ICD-10-CM | POA: Diagnosis not present

## 2017-09-01 DIAGNOSIS — S32501D Unspecified fracture of right pubis, subsequent encounter for fracture with routine healing: Secondary | ICD-10-CM | POA: Diagnosis not present

## 2017-09-01 DIAGNOSIS — F0280 Dementia in other diseases classified elsewhere without behavioral disturbance: Secondary | ICD-10-CM | POA: Diagnosis not present

## 2017-09-02 DIAGNOSIS — I10 Essential (primary) hypertension: Secondary | ICD-10-CM | POA: Diagnosis not present

## 2017-09-02 DIAGNOSIS — M069 Rheumatoid arthritis, unspecified: Secondary | ICD-10-CM | POA: Diagnosis not present

## 2017-09-02 DIAGNOSIS — S32010D Wedge compression fracture of first lumbar vertebra, subsequent encounter for fracture with routine healing: Secondary | ICD-10-CM | POA: Diagnosis not present

## 2017-09-02 DIAGNOSIS — S32501D Unspecified fracture of right pubis, subsequent encounter for fracture with routine healing: Secondary | ICD-10-CM | POA: Diagnosis not present

## 2017-09-02 DIAGNOSIS — G309 Alzheimer's disease, unspecified: Secondary | ICD-10-CM | POA: Diagnosis not present

## 2017-09-02 DIAGNOSIS — F0280 Dementia in other diseases classified elsewhere without behavioral disturbance: Secondary | ICD-10-CM | POA: Diagnosis not present

## 2017-09-06 DIAGNOSIS — I10 Essential (primary) hypertension: Secondary | ICD-10-CM | POA: Diagnosis not present

## 2017-09-06 DIAGNOSIS — M069 Rheumatoid arthritis, unspecified: Secondary | ICD-10-CM | POA: Diagnosis not present

## 2017-09-06 DIAGNOSIS — F0280 Dementia in other diseases classified elsewhere without behavioral disturbance: Secondary | ICD-10-CM | POA: Diagnosis not present

## 2017-09-06 DIAGNOSIS — S32010D Wedge compression fracture of first lumbar vertebra, subsequent encounter for fracture with routine healing: Secondary | ICD-10-CM | POA: Diagnosis not present

## 2017-09-06 DIAGNOSIS — G309 Alzheimer's disease, unspecified: Secondary | ICD-10-CM | POA: Diagnosis not present

## 2017-09-06 DIAGNOSIS — S32501D Unspecified fracture of right pubis, subsequent encounter for fracture with routine healing: Secondary | ICD-10-CM | POA: Diagnosis not present

## 2017-09-07 DIAGNOSIS — M069 Rheumatoid arthritis, unspecified: Secondary | ICD-10-CM | POA: Diagnosis not present

## 2017-09-07 DIAGNOSIS — G309 Alzheimer's disease, unspecified: Secondary | ICD-10-CM | POA: Diagnosis not present

## 2017-09-07 DIAGNOSIS — F0280 Dementia in other diseases classified elsewhere without behavioral disturbance: Secondary | ICD-10-CM | POA: Diagnosis not present

## 2017-09-07 DIAGNOSIS — S32501D Unspecified fracture of right pubis, subsequent encounter for fracture with routine healing: Secondary | ICD-10-CM | POA: Diagnosis not present

## 2017-09-07 DIAGNOSIS — S32010D Wedge compression fracture of first lumbar vertebra, subsequent encounter for fracture with routine healing: Secondary | ICD-10-CM | POA: Diagnosis not present

## 2017-09-07 DIAGNOSIS — I10 Essential (primary) hypertension: Secondary | ICD-10-CM | POA: Diagnosis not present

## 2017-09-11 ENCOUNTER — Emergency Department (HOSPITAL_COMMUNITY)
Admission: EM | Admit: 2017-09-11 | Discharge: 2017-09-11 | Disposition: A | Discharge status: Home / Self Care | Payer: Medicare Other | Attending: Emergency Medicine | Admitting: Emergency Medicine

## 2017-09-11 ENCOUNTER — Emergency Department (HOSPITAL_COMMUNITY): Payer: Medicare Other

## 2017-09-11 ENCOUNTER — Encounter (HOSPITAL_COMMUNITY): Payer: Self-pay | Admitting: Emergency Medicine

## 2017-09-11 DIAGNOSIS — I10 Essential (primary) hypertension: Secondary | ICD-10-CM | POA: Insufficient documentation

## 2017-09-11 DIAGNOSIS — Z79899 Other long term (current) drug therapy: Secondary | ICD-10-CM | POA: Insufficient documentation

## 2017-09-11 DIAGNOSIS — E031 Congenital hypothyroidism without goiter: Secondary | ICD-10-CM | POA: Insufficient documentation

## 2017-09-11 DIAGNOSIS — R600 Localized edema: Secondary | ICD-10-CM | POA: Insufficient documentation

## 2017-09-11 DIAGNOSIS — R0902 Hypoxemia: Secondary | ICD-10-CM | POA: Diagnosis not present

## 2017-09-11 DIAGNOSIS — G309 Alzheimer's disease, unspecified: Secondary | ICD-10-CM | POA: Diagnosis not present

## 2017-09-11 DIAGNOSIS — Z853 Personal history of malignant neoplasm of breast: Secondary | ICD-10-CM | POA: Insufficient documentation

## 2017-09-11 DIAGNOSIS — R609 Edema, unspecified: Secondary | ICD-10-CM

## 2017-09-11 DIAGNOSIS — R229 Localized swelling, mass and lump, unspecified: Secondary | ICD-10-CM | POA: Diagnosis not present

## 2017-09-11 LAB — I-STAT TROPONIN, ED: Troponin i, poc: 0.03 ng/mL (ref 0.00–0.08)

## 2017-09-11 LAB — CBC
HEMATOCRIT: 33.9 % — AB (ref 36.0–46.0)
Hemoglobin: 10.8 g/dL — ABNORMAL LOW (ref 12.0–15.0)
MCH: 32.3 pg (ref 26.0–34.0)
MCHC: 31.9 g/dL (ref 30.0–36.0)
MCV: 101.5 fL — AB (ref 78.0–100.0)
Platelets: 503 10*3/uL — ABNORMAL HIGH (ref 150–400)
RBC: 3.34 MIL/uL — AB (ref 3.87–5.11)
RDW: 15.1 % (ref 11.5–15.5)
WBC: 7.9 10*3/uL (ref 4.0–10.5)

## 2017-09-11 LAB — BASIC METABOLIC PANEL
Anion gap: 10 (ref 5–15)
BUN: 20 mg/dL (ref 6–20)
CHLORIDE: 95 mmol/L — AB (ref 101–111)
CO2: 30 mmol/L (ref 22–32)
Calcium: 9.4 mg/dL (ref 8.9–10.3)
Creatinine, Ser: 0.76 mg/dL (ref 0.44–1.00)
GFR calc Af Amer: >60 mL/min (ref >60)
GFR calc non Af Amer: >60 mL/min (ref >60)
Glucose, Bld: 113 mg/dL — ABNORMAL HIGH (ref 65–99)
POTASSIUM: 3.7 mmol/L (ref 3.5–5.1)
SODIUM: 135 mmol/L (ref 135–145)

## 2017-09-11 MED ORDER — FUROSEMIDE 20 MG PO TABS: 20.0000 mg | ORAL_TABLET | Freq: Every day | ORAL | 0 refills | Status: AC

## 2017-09-11 MED ORDER — POTASSIUM CHLORIDE ER 10 MEQ PO TBCR: 10.0000 meq | EXTENDED_RELEASE_TABLET | Freq: Every day | ORAL | 0 refills | Status: AC

## 2017-09-11 NOTE — ED Notes (Signed)
Doctor at bedside.

## 2017-09-11 NOTE — ED Notes (Signed)
EKG given to EDP.  

## 2017-09-11 NOTE — ED Triage Notes (Signed)
Arrived via EMS from nursing home patient developed worsening bilateral lower extremity edema. Denies pain.

## 2017-09-11 NOTE — Discharge Instructions (Signed)
To treat the edema of your legs, several things are important.  Elevate your legs frequently during the day, and avoid keeping them in a dependent position, more than 2 hours.  We advise at least 6 hours of leg elevation during the time while you are awake during the day.  Use compression stockings which go above the knees, during the day to help mobilize the fluid from your legs.  We are trying a low-dose diuretic, which may help your swelling, but will need to be followed closely by your primary care doctor for appropriateness of dosing and treatment.  The diuretic may lower your potassium level so you need to take potassium when taking the diuretic medication.

## 2017-09-11 NOTE — ED Provider Notes (Signed)
[facility] DEPT Provider Note   CSN: [id number] Arrival date & time: 09/11/17  8299     History   Chief Complaint Chief Complaint  Patient presents with  . Leg Swelling    HPI Charlene Duncan is a 81 y.o. female.  She presents for evaluation of swelling of her legs for several days.  She has been living in an assisted living facility for 2 weeks, following an injury with pelvic fracture about 6 weeks ago then rehabilitation than a trial at home.  She needed assisted living because of inability to manage her activities, due to her disability.  She is here with her son who last saw her yesterday her leg swelling has worsened overnight.  Recently she has been spending more time in a wheelchair, because of the leg swelling, which has made it difficult to walk.  She does not spend time elevating legs during the day.  There is been no recent changes in appetite, medications, or other illnesses.  She does not have a cardiac history.  The patient is a vague historian, but able to answer general questions about her health.  There are no other known modifying factors.   HPI  Past Medical History:  Diagnosis Date  . Alzheimer disease 05/16/2017  . Anemia   . Breast CA ([place])   . Cancer (HCC)    breast  . High cholesterol   . HTN (hypertension) 08/10/2017  . Iron deficiency   . Meniere disease   . Multiple falls   . Myalgia   . OA (osteoarthritis) of knee   . Peripheral edema   . RA (rheumatoid arthritis) (HCC)    Dr. Trudie Reed  . Thyroid disease    hypothyroid  . Vitamin B 12 deficiency     Patient Active Problem List   Diagnosis Date Noted  . HTN (hypertension) 08/10/2017  . Lumbar compression fracture ([place]) 08/02/2017  . Closed fracture of multiple pubic rami, right, sequela   . Anemia 08/01/2017  . Hyperglycemia 08/01/2017  . Compression fracture of first lumbar vertebra ([place]) 07/31/2017  . Alzheimer disease 05/16/2017  . Arthritis 06/16/2014  . Congenital  hypothyroidism without goiter 06/16/2014  . History of breast cancer 06/16/2014  . Hyperlipidemia 06/16/2014  . Iron deficiency 06/16/2014    Past Surgical History:  Procedure Laterality Date  . BREAST LUMPECTOMY    . CATARACT EXTRACTION      OB History    No data available       Home Medications    Prior to Admission medications   Medication Sig Start Date End Date Taking? Authorizing Provider  acetaminophen (TYLENOL) 325 MG tablet Take 2 tablets (650 mg total) by mouth every 6 (six) hours as needed for mild pain (or Fever >/= 101). 08/02/17   [name], MD  amLODipine (NORVASC) 2.5 MG tablet Take 1 tablet (2.5 mg total) by mouth daily. 08/11/17   Love, Ivan Anchors, PA-C  Cholecalciferol (VITAMIN D PO) Take 1 tablet by mouth daily.    [provider]  donepezil (ARICEPT) 5 MG tablet TAKE 1 TABLET BY MOUTH AT BEDTIME 07/21/17   [name], MD  DULoxetine (CYMBALTA) 30 MG capsule Take 30 mg by mouth daily.  06/04/17   [provider]  Iron-FA-B Cmp-C-Biot-Probiotic (FUSION PLUS PO) Take 1 capsule by mouth daily.    [provider]  levothyroxine (SYNTHROID, LEVOTHROID) 88 MCG tablet Take 88 mcg by mouth every other day.  03/21/17   [provider]  methotrexate 2.5  MG tablet Take 10 mg by mouth once a week. THURSDAYS 07/11/17   [provider]  predniSONE (DELTASONE) 10 MG tablet Take 5 mg by mouth daily.  06/07/17   [provider]  traMADol (ULTRAM) 50 MG tablet Take 1 tablet (50 mg total) by mouth every 6 (six) hours as needed for severe pain. 08/10/17   Love, Ivan Anchors, PA-C  vitamin B-12 (CYANOCOBALAMIN) 500 MCG tablet Take 500 mcg by mouth daily.    [provider]    Family History Family History  Problem Relation Age of Onset  . Arthritis Mother   . Diabetes Mother   . Cirrhosis Sister   . Cirrhosis Brother     Social History Social History  Substance Use Topics  . Smoking status: Never Smoker  .  Smokeless tobacco: Never Used  . Alcohol use No     Allergies   Naproxen   Review of Systems Review of Systems  All other systems reviewed and are negative.    Physical Exam Updated Vital Signs BP (!) 195/80   Pulse 90   Temp 98.2 F (36.8 C)   Resp 16   Ht 5\' 5"  (1.651 m)   Wt 49.9 kg (110 lb)   BMI 18.30 kg/m   Physical Exam  Constitutional: She appears well-developed and well-nourished.  HENT:  Head: Normocephalic and atraumatic.  Eyes: Pupils are equal, round, and reactive to light. Conjunctivae and EOM are normal.  Neck: Normal range of motion and phonation normal. Neck supple.  Cardiovascular: Normal rate and regular rhythm.   Pulmonary/Chest: Effort normal and breath sounds normal. She exhibits no tenderness.  Abdominal: Soft. She exhibits no distension. There is no tenderness. There is no guarding.  Musculoskeletal: Normal range of motion. She exhibits edema (3+ edema knees to toes bilaterally).  Mild diffuse lower extremity tenderness associated with the swelling.  No associated areas of induration, erythema, drainage, or skin changes.  Neurological: She is alert. She exhibits normal muscle tone.  Skin: Skin is warm and dry.  Psychiatric: She has a normal mood and affect. Her behavior is normal. Judgment and thought content normal.  Nursing note and vitals reviewed.    ED Treatments / Results  Labs (all labs ordered are listed, but only abnormal results are displayed) Labs Reviewed  BASIC METABOLIC PANEL  CBC  I-STAT TROPONIN, ED    EKG  EKG Interpretation  Date/Time:  Sunday September 11 2017 09:22:25 EDT Ventricular Rate:  83 PR Interval:    QRS Duration: 97 QT Interval:  397 QTC Calculation: 467 R Axis:   59 Text Interpretation:  Sinus rhythm Supraventricular bigeminy Probable left ventricular hypertrophy Minimal ST elevation, inferior leads since last tracing no significant change Confirmed by Daleen Bo [phone]) on 09/11/2017 9:39:22 AM        Radiology No results found.  Procedures Procedures (including critical care time)  Medications Ordered in ED Medications - No data to display   Initial Impression / Assessment and Plan / ED Course  I have reviewed the triage vital signs and the nursing notes.  Pertinent labs & imaging results that were available during my care of the patient were reviewed by me and considered in my medical decision making (see chart for details).  Clinical Course as of Sep 11 1136  Sun Sep 11, 2017  1135 Low Hemoglobin: (!) 10.8 [EW]  1136 Normal WBC: 7.9 [EW]  1136 Normal Troponin i, poc: 0.03 [EW]  1136 Sodium: 135 [EW]  1136 Normal  [  EW]  1136 Normal Potassium: 3.7 [EW]  1136 No acute abnormalities DG Chest 2 View [EW]  1137 No acute abnormality EKG 12-Lead [EW]    Clinical Course User Index [EW] Daleen Bo, MD     Patient Vitals for the past 24 hrs:  BP Temp Pulse Resp SpO2 Height Weight  09/11/17 0930 (!) 170/70 - 85 14 99 % - -  09/11/17 0924 - - - - - 5\' 5"  (1.651 m) 49.9 kg (110 lb)  09/11/17 0923 (!) 195/80 98.2 F (36.8 C) 90 16 - - -    11:39 AM Reevaluation with update and discussion. After initial assessment and treatment, an updated evaluation reveals no change in clinical status.  Findings discussed with patient, and son, all questions answered. Lexiana Spindel L      Final Clinical Impressions(s) / ED Diagnoses   Final diagnoses:  Peripheral edema    Peripheral edema, likely multifactorial, but symptomatic at this time.  Treatments will include aggressive elevation, compression stocking, and low-dose diuretic, trial with close supervision, by PCP.  Doubt ACS, PE, pneumonia, metabolic instability or impending vascular collapse.  Nursing Notes Reviewed/ Care Coordinated Applicable Imaging Reviewed Interpretation of Laboratory Data incorporated into ED treatment  The patient appears reasonably screened and/or stabilized for discharge and I doubt any  other medical condition or other [facility] requiring further screening, evaluation, or treatment in the ED at this time prior to discharge.  Plan: Home Medications-continue usual medications; Home Treatments-rest, compression stockings, aggressive elevation; return here if the recommended treatment, does not improve the symptoms; Recommended follow up-PCP checkup 1 week and as needed.   New Prescriptions New Prescriptions   No medications on file     Daleen Bo, MD 09/11/17 1145

## 2017-09-13 DIAGNOSIS — M2550 Pain in unspecified joint: Secondary | ICD-10-CM | POA: Diagnosis not present

## 2017-09-13 DIAGNOSIS — R531 Weakness: Secondary | ICD-10-CM | POA: Diagnosis not present

## 2017-09-13 DIAGNOSIS — R5382 Chronic fatigue, unspecified: Secondary | ICD-10-CM | POA: Diagnosis not present

## 2017-09-13 DIAGNOSIS — R601 Generalized edema: Secondary | ICD-10-CM | POA: Diagnosis not present

## 2017-09-13 DIAGNOSIS — Z681 Body mass index (BMI) 19 or less, adult: Secondary | ICD-10-CM | POA: Diagnosis not present

## 2017-09-13 DIAGNOSIS — S32591A Other specified fracture of right pubis, initial encounter for closed fracture: Secondary | ICD-10-CM | POA: Diagnosis not present

## 2017-09-13 DIAGNOSIS — R636 Underweight: Secondary | ICD-10-CM | POA: Diagnosis not present

## 2017-09-13 DIAGNOSIS — R768 Other specified abnormal immunological findings in serum: Secondary | ICD-10-CM | POA: Diagnosis not present

## 2017-09-13 DIAGNOSIS — M0609 Rheumatoid arthritis without rheumatoid factor, multiple sites: Secondary | ICD-10-CM | POA: Diagnosis not present

## 2017-09-27 DIAGNOSIS — D519 Vitamin B12 deficiency anemia, unspecified: Secondary | ICD-10-CM | POA: Diagnosis not present

## 2017-09-27 DIAGNOSIS — R413 Other amnesia: Secondary | ICD-10-CM | POA: Diagnosis not present

## 2017-09-27 DIAGNOSIS — F331 Major depressive disorder, recurrent, moderate: Secondary | ICD-10-CM | POA: Diagnosis not present

## 2017-09-27 DIAGNOSIS — E039 Hypothyroidism, unspecified: Secondary | ICD-10-CM | POA: Diagnosis not present

## 2017-09-27 DIAGNOSIS — N390 Urinary tract infection, site not specified: Secondary | ICD-10-CM | POA: Diagnosis not present

## 2017-09-27 DIAGNOSIS — R600 Localized edema: Secondary | ICD-10-CM | POA: Diagnosis not present

## 2017-09-27 DIAGNOSIS — E785 Hyperlipidemia, unspecified: Secondary | ICD-10-CM | POA: Diagnosis not present

## 2017-09-27 DIAGNOSIS — S329XXD Fracture of unspecified parts of lumbosacral spine and pelvis, subsequent encounter for fracture with routine healing: Secondary | ICD-10-CM | POA: Diagnosis not present

## 2017-09-27 DIAGNOSIS — I499 Cardiac arrhythmia, unspecified: Secondary | ICD-10-CM | POA: Diagnosis not present

## 2017-09-27 DIAGNOSIS — K5900 Constipation, unspecified: Secondary | ICD-10-CM | POA: Diagnosis not present

## 2017-09-27 DIAGNOSIS — M05611 Rheumatoid arthritis of right shoulder with involvement of other organs and systems: Secondary | ICD-10-CM | POA: Diagnosis not present

## 2017-09-27 DIAGNOSIS — I10 Essential (primary) hypertension: Secondary | ICD-10-CM | POA: Diagnosis not present

## 2017-09-27 DIAGNOSIS — S32019D Unspecified fracture of first lumbar vertebra, subsequent encounter for fracture with routine healing: Secondary | ICD-10-CM | POA: Diagnosis not present

## 2017-09-27 DIAGNOSIS — E559 Vitamin D deficiency, unspecified: Secondary | ICD-10-CM | POA: Diagnosis not present

## 2017-09-27 DIAGNOSIS — R4182 Altered mental status, unspecified: Secondary | ICD-10-CM | POA: Diagnosis not present

## 2017-09-27 DIAGNOSIS — R609 Edema, unspecified: Secondary | ICD-10-CM | POA: Diagnosis not present

## 2017-09-27 DIAGNOSIS — M069 Rheumatoid arthritis, unspecified: Secondary | ICD-10-CM | POA: Diagnosis not present

## 2017-09-27 DIAGNOSIS — G8929 Other chronic pain: Secondary | ICD-10-CM | POA: Diagnosis not present

## 2017-09-28 DIAGNOSIS — I10 Essential (primary) hypertension: Secondary | ICD-10-CM | POA: Diagnosis not present

## 2017-09-28 DIAGNOSIS — R609 Edema, unspecified: Secondary | ICD-10-CM | POA: Diagnosis not present

## 2017-09-28 DIAGNOSIS — M069 Rheumatoid arthritis, unspecified: Secondary | ICD-10-CM | POA: Diagnosis not present

## 2017-09-28 DIAGNOSIS — R413 Other amnesia: Secondary | ICD-10-CM | POA: Diagnosis not present

## 2017-09-28 DIAGNOSIS — S32019D Unspecified fracture of first lumbar vertebra, subsequent encounter for fracture with routine healing: Secondary | ICD-10-CM | POA: Diagnosis not present

## 2017-09-28 DIAGNOSIS — S329XXD Fracture of unspecified parts of lumbosacral spine and pelvis, subsequent encounter for fracture with routine healing: Secondary | ICD-10-CM | POA: Diagnosis not present

## 2017-09-29 DIAGNOSIS — R609 Edema, unspecified: Secondary | ICD-10-CM | POA: Diagnosis not present

## 2017-09-29 DIAGNOSIS — R413 Other amnesia: Secondary | ICD-10-CM | POA: Diagnosis not present

## 2017-09-29 DIAGNOSIS — M069 Rheumatoid arthritis, unspecified: Secondary | ICD-10-CM | POA: Diagnosis not present

## 2017-09-29 DIAGNOSIS — I10 Essential (primary) hypertension: Secondary | ICD-10-CM | POA: Diagnosis not present

## 2017-09-29 DIAGNOSIS — S32019D Unspecified fracture of first lumbar vertebra, subsequent encounter for fracture with routine healing: Secondary | ICD-10-CM | POA: Diagnosis not present

## 2017-09-29 DIAGNOSIS — S329XXD Fracture of unspecified parts of lumbosacral spine and pelvis, subsequent encounter for fracture with routine healing: Secondary | ICD-10-CM | POA: Diagnosis not present

## 2017-09-30 DIAGNOSIS — R413 Other amnesia: Secondary | ICD-10-CM | POA: Diagnosis not present

## 2017-09-30 DIAGNOSIS — S329XXD Fracture of unspecified parts of lumbosacral spine and pelvis, subsequent encounter for fracture with routine healing: Secondary | ICD-10-CM | POA: Diagnosis not present

## 2017-09-30 DIAGNOSIS — M069 Rheumatoid arthritis, unspecified: Secondary | ICD-10-CM | POA: Diagnosis not present

## 2017-09-30 DIAGNOSIS — S32019D Unspecified fracture of first lumbar vertebra, subsequent encounter for fracture with routine healing: Secondary | ICD-10-CM | POA: Diagnosis not present

## 2017-09-30 DIAGNOSIS — R609 Edema, unspecified: Secondary | ICD-10-CM | POA: Diagnosis not present

## 2017-09-30 DIAGNOSIS — I10 Essential (primary) hypertension: Secondary | ICD-10-CM | POA: Diagnosis not present

## 2017-10-03 DIAGNOSIS — R609 Edema, unspecified: Secondary | ICD-10-CM | POA: Diagnosis not present

## 2017-10-03 DIAGNOSIS — R413 Other amnesia: Secondary | ICD-10-CM | POA: Diagnosis not present

## 2017-10-03 DIAGNOSIS — I10 Essential (primary) hypertension: Secondary | ICD-10-CM | POA: Diagnosis not present

## 2017-10-03 DIAGNOSIS — M069 Rheumatoid arthritis, unspecified: Secondary | ICD-10-CM | POA: Diagnosis not present

## 2017-10-03 DIAGNOSIS — S329XXD Fracture of unspecified parts of lumbosacral spine and pelvis, subsequent encounter for fracture with routine healing: Secondary | ICD-10-CM | POA: Diagnosis not present

## 2017-10-03 DIAGNOSIS — G3184 Mild cognitive impairment, so stated: Secondary | ICD-10-CM | POA: Diagnosis not present

## 2017-10-03 DIAGNOSIS — F331 Major depressive disorder, recurrent, moderate: Secondary | ICD-10-CM | POA: Diagnosis not present

## 2017-10-03 DIAGNOSIS — S32019D Unspecified fracture of first lumbar vertebra, subsequent encounter for fracture with routine healing: Secondary | ICD-10-CM | POA: Diagnosis not present

## 2017-10-04 DIAGNOSIS — I4891 Unspecified atrial fibrillation: Secondary | ICD-10-CM | POA: Diagnosis not present

## 2017-10-04 DIAGNOSIS — M069 Rheumatoid arthritis, unspecified: Secondary | ICD-10-CM | POA: Diagnosis not present

## 2017-10-04 DIAGNOSIS — I10 Essential (primary) hypertension: Secondary | ICD-10-CM | POA: Diagnosis not present

## 2017-10-04 DIAGNOSIS — R413 Other amnesia: Secondary | ICD-10-CM | POA: Diagnosis not present

## 2017-10-04 DIAGNOSIS — S329XXD Fracture of unspecified parts of lumbosacral spine and pelvis, subsequent encounter for fracture with routine healing: Secondary | ICD-10-CM | POA: Diagnosis not present

## 2017-10-04 DIAGNOSIS — S32019D Unspecified fracture of first lumbar vertebra, subsequent encounter for fracture with routine healing: Secondary | ICD-10-CM | POA: Diagnosis not present

## 2017-10-04 DIAGNOSIS — R609 Edema, unspecified: Secondary | ICD-10-CM | POA: Diagnosis not present

## 2017-10-05 DIAGNOSIS — S32019D Unspecified fracture of first lumbar vertebra, subsequent encounter for fracture with routine healing: Secondary | ICD-10-CM | POA: Diagnosis not present

## 2017-10-05 DIAGNOSIS — R413 Other amnesia: Secondary | ICD-10-CM | POA: Diagnosis not present

## 2017-10-05 DIAGNOSIS — S329XXD Fracture of unspecified parts of lumbosacral spine and pelvis, subsequent encounter for fracture with routine healing: Secondary | ICD-10-CM | POA: Diagnosis not present

## 2017-10-05 DIAGNOSIS — I10 Essential (primary) hypertension: Secondary | ICD-10-CM | POA: Diagnosis not present

## 2017-10-05 DIAGNOSIS — M069 Rheumatoid arthritis, unspecified: Secondary | ICD-10-CM | POA: Diagnosis not present

## 2017-10-05 DIAGNOSIS — R609 Edema, unspecified: Secondary | ICD-10-CM | POA: Diagnosis not present

## 2017-10-06 DIAGNOSIS — R413 Other amnesia: Secondary | ICD-10-CM | POA: Diagnosis not present

## 2017-10-06 DIAGNOSIS — S32019D Unspecified fracture of first lumbar vertebra, subsequent encounter for fracture with routine healing: Secondary | ICD-10-CM | POA: Diagnosis not present

## 2017-10-06 DIAGNOSIS — S329XXD Fracture of unspecified parts of lumbosacral spine and pelvis, subsequent encounter for fracture with routine healing: Secondary | ICD-10-CM | POA: Diagnosis not present

## 2017-10-06 DIAGNOSIS — M069 Rheumatoid arthritis, unspecified: Secondary | ICD-10-CM | POA: Diagnosis not present

## 2017-10-06 DIAGNOSIS — I10 Essential (primary) hypertension: Secondary | ICD-10-CM | POA: Diagnosis not present

## 2017-10-06 DIAGNOSIS — R609 Edema, unspecified: Secondary | ICD-10-CM | POA: Diagnosis not present

## 2017-10-07 DIAGNOSIS — R413 Other amnesia: Secondary | ICD-10-CM | POA: Diagnosis not present

## 2017-10-07 DIAGNOSIS — M069 Rheumatoid arthritis, unspecified: Secondary | ICD-10-CM | POA: Diagnosis not present

## 2017-10-07 DIAGNOSIS — S329XXD Fracture of unspecified parts of lumbosacral spine and pelvis, subsequent encounter for fracture with routine healing: Secondary | ICD-10-CM | POA: Diagnosis not present

## 2017-10-07 DIAGNOSIS — R609 Edema, unspecified: Secondary | ICD-10-CM | POA: Diagnosis not present

## 2017-10-07 DIAGNOSIS — S32019D Unspecified fracture of first lumbar vertebra, subsequent encounter for fracture with routine healing: Secondary | ICD-10-CM | POA: Diagnosis not present

## 2017-10-07 DIAGNOSIS — I10 Essential (primary) hypertension: Secondary | ICD-10-CM | POA: Diagnosis not present

## 2017-10-10 DIAGNOSIS — S329XXD Fracture of unspecified parts of lumbosacral spine and pelvis, subsequent encounter for fracture with routine healing: Secondary | ICD-10-CM | POA: Diagnosis not present

## 2017-10-10 DIAGNOSIS — I10 Essential (primary) hypertension: Secondary | ICD-10-CM | POA: Diagnosis not present

## 2017-10-10 DIAGNOSIS — R413 Other amnesia: Secondary | ICD-10-CM | POA: Diagnosis not present

## 2017-10-10 DIAGNOSIS — M069 Rheumatoid arthritis, unspecified: Secondary | ICD-10-CM | POA: Diagnosis not present

## 2017-10-10 DIAGNOSIS — S32019D Unspecified fracture of first lumbar vertebra, subsequent encounter for fracture with routine healing: Secondary | ICD-10-CM | POA: Diagnosis not present

## 2017-10-10 DIAGNOSIS — R609 Edema, unspecified: Secondary | ICD-10-CM | POA: Diagnosis not present

## 2017-10-11 DIAGNOSIS — S329XXD Fracture of unspecified parts of lumbosacral spine and pelvis, subsequent encounter for fracture with routine healing: Secondary | ICD-10-CM | POA: Diagnosis not present

## 2017-10-11 DIAGNOSIS — I10 Essential (primary) hypertension: Secondary | ICD-10-CM | POA: Diagnosis not present

## 2017-10-11 DIAGNOSIS — R627 Adult failure to thrive: Secondary | ICD-10-CM | POA: Diagnosis not present

## 2017-10-11 DIAGNOSIS — R413 Other amnesia: Secondary | ICD-10-CM | POA: Diagnosis not present

## 2017-10-11 DIAGNOSIS — M069 Rheumatoid arthritis, unspecified: Secondary | ICD-10-CM | POA: Diagnosis not present

## 2017-10-11 DIAGNOSIS — R609 Edema, unspecified: Secondary | ICD-10-CM | POA: Diagnosis not present

## 2017-10-11 DIAGNOSIS — S32019D Unspecified fracture of first lumbar vertebra, subsequent encounter for fracture with routine healing: Secondary | ICD-10-CM | POA: Diagnosis not present

## 2017-10-11 DIAGNOSIS — I4891 Unspecified atrial fibrillation: Secondary | ICD-10-CM | POA: Diagnosis not present

## 2017-10-12 DIAGNOSIS — R413 Other amnesia: Secondary | ICD-10-CM | POA: Diagnosis not present

## 2017-10-12 DIAGNOSIS — S329XXD Fracture of unspecified parts of lumbosacral spine and pelvis, subsequent encounter for fracture with routine healing: Secondary | ICD-10-CM | POA: Diagnosis not present

## 2017-10-12 DIAGNOSIS — R609 Edema, unspecified: Secondary | ICD-10-CM | POA: Diagnosis not present

## 2017-10-12 DIAGNOSIS — I10 Essential (primary) hypertension: Secondary | ICD-10-CM | POA: Diagnosis not present

## 2017-10-12 DIAGNOSIS — S32019D Unspecified fracture of first lumbar vertebra, subsequent encounter for fracture with routine healing: Secondary | ICD-10-CM | POA: Diagnosis not present

## 2017-10-12 DIAGNOSIS — M069 Rheumatoid arthritis, unspecified: Secondary | ICD-10-CM | POA: Diagnosis not present

## 2017-10-13 DIAGNOSIS — S32019D Unspecified fracture of first lumbar vertebra, subsequent encounter for fracture with routine healing: Secondary | ICD-10-CM | POA: Diagnosis not present

## 2017-10-13 DIAGNOSIS — R413 Other amnesia: Secondary | ICD-10-CM | POA: Diagnosis not present

## 2017-10-13 DIAGNOSIS — I10 Essential (primary) hypertension: Secondary | ICD-10-CM | POA: Diagnosis not present

## 2017-10-13 DIAGNOSIS — R609 Edema, unspecified: Secondary | ICD-10-CM | POA: Diagnosis not present

## 2017-10-13 DIAGNOSIS — M069 Rheumatoid arthritis, unspecified: Secondary | ICD-10-CM | POA: Diagnosis not present

## 2017-10-13 DIAGNOSIS — S329XXD Fracture of unspecified parts of lumbosacral spine and pelvis, subsequent encounter for fracture with routine healing: Secondary | ICD-10-CM | POA: Diagnosis not present

## 2017-10-14 DIAGNOSIS — S32019D Unspecified fracture of first lumbar vertebra, subsequent encounter for fracture with routine healing: Secondary | ICD-10-CM | POA: Diagnosis not present

## 2017-10-14 DIAGNOSIS — R413 Other amnesia: Secondary | ICD-10-CM | POA: Diagnosis not present

## 2017-10-14 DIAGNOSIS — S329XXD Fracture of unspecified parts of lumbosacral spine and pelvis, subsequent encounter for fracture with routine healing: Secondary | ICD-10-CM | POA: Diagnosis not present

## 2017-10-14 DIAGNOSIS — R609 Edema, unspecified: Secondary | ICD-10-CM | POA: Diagnosis not present

## 2017-10-14 DIAGNOSIS — M069 Rheumatoid arthritis, unspecified: Secondary | ICD-10-CM | POA: Diagnosis not present

## 2017-10-14 DIAGNOSIS — I10 Essential (primary) hypertension: Secondary | ICD-10-CM | POA: Diagnosis not present

## 2017-10-17 DIAGNOSIS — R413 Other amnesia: Secondary | ICD-10-CM | POA: Diagnosis not present

## 2017-10-17 DIAGNOSIS — S329XXD Fracture of unspecified parts of lumbosacral spine and pelvis, subsequent encounter for fracture with routine healing: Secondary | ICD-10-CM | POA: Diagnosis not present

## 2017-10-17 DIAGNOSIS — R609 Edema, unspecified: Secondary | ICD-10-CM | POA: Diagnosis not present

## 2017-10-17 DIAGNOSIS — M069 Rheumatoid arthritis, unspecified: Secondary | ICD-10-CM | POA: Diagnosis not present

## 2017-10-17 DIAGNOSIS — S32019D Unspecified fracture of first lumbar vertebra, subsequent encounter for fracture with routine healing: Secondary | ICD-10-CM | POA: Diagnosis not present

## 2017-10-17 DIAGNOSIS — I10 Essential (primary) hypertension: Secondary | ICD-10-CM | POA: Diagnosis not present

## 2017-10-18 ENCOUNTER — Encounter (INDEPENDENT_AMBULATORY_CARE_PROVIDER_SITE_OTHER): Payer: Self-pay

## 2017-10-18 ENCOUNTER — Ambulatory Visit (INDEPENDENT_AMBULATORY_CARE_PROVIDER_SITE_OTHER): Payer: Medicare Other | Admitting: Adult Health

## 2017-10-18 ENCOUNTER — Encounter: Payer: Self-pay | Admitting: Adult Health

## 2017-10-18 VITALS — BP 163/75 | HR 73 | Wt 104.0 lb

## 2017-10-18 DIAGNOSIS — I10 Essential (primary) hypertension: Secondary | ICD-10-CM | POA: Diagnosis not present

## 2017-10-18 DIAGNOSIS — L853 Xerosis cutis: Secondary | ICD-10-CM | POA: Diagnosis not present

## 2017-10-18 DIAGNOSIS — Q845 Enlarged and hypertrophic nails: Secondary | ICD-10-CM | POA: Diagnosis not present

## 2017-10-18 DIAGNOSIS — M069 Rheumatoid arthritis, unspecified: Secondary | ICD-10-CM | POA: Diagnosis not present

## 2017-10-18 DIAGNOSIS — S32019D Unspecified fracture of first lumbar vertebra, subsequent encounter for fracture with routine healing: Secondary | ICD-10-CM | POA: Diagnosis not present

## 2017-10-18 DIAGNOSIS — S329XXD Fracture of unspecified parts of lumbosacral spine and pelvis, subsequent encounter for fracture with routine healing: Secondary | ICD-10-CM | POA: Diagnosis not present

## 2017-10-18 DIAGNOSIS — R413 Other amnesia: Secondary | ICD-10-CM

## 2017-10-18 DIAGNOSIS — L603 Nail dystrophy: Secondary | ICD-10-CM | POA: Diagnosis not present

## 2017-10-18 DIAGNOSIS — M2010 Hallux valgus (acquired), unspecified foot: Secondary | ICD-10-CM | POA: Diagnosis not present

## 2017-10-18 DIAGNOSIS — R609 Edema, unspecified: Secondary | ICD-10-CM | POA: Diagnosis not present

## 2017-10-18 DIAGNOSIS — I739 Peripheral vascular disease, unspecified: Secondary | ICD-10-CM | POA: Diagnosis not present

## 2017-10-18 DIAGNOSIS — B351 Tinea unguium: Secondary | ICD-10-CM | POA: Diagnosis not present

## 2017-10-18 DIAGNOSIS — R600 Localized edema: Secondary | ICD-10-CM | POA: Diagnosis not present

## 2017-10-18 MED ORDER — DONEPEZIL HCL 5 MG PO TABS: 5.0000 mg | ORAL_TABLET | Freq: Every day | ORAL | 11 refills | Status: AC

## 2017-10-18 NOTE — Progress Notes (Signed)
PATIENT: Charlene Duncan DOB: 20-Jun-1936  REASON FOR VISIT: follow up HISTORY FROM: patient  HISTORY OF PRESENT ILLNESS: Today 10/18/17  Charlene Duncan is a 81 year old female with a history of progressive memory disturbance.  She returns today for follow-up.  Her son is with her today.  He reports that she suffered a fall and broke her pelvis.  After that it was recommended that she go to an extended care facility.  She is now at [facility].  He feels that her memory has declined.  She does require assistance with ADLs.  She can feed herself although she reports that she does not eat that much.  She states that she does not always sleep well.  Reports that with any sounds she wakes up.  She denies hallucinations.  Her family manages her finances.  Her son states that when she was moved over to [facility] for some reason the Aricept did not get prescribed.  She has not been taking this medication for approximately 1-1/2 months.  She returns today for follow-up.  HISTORY 05/16/17: Charlene Duncan is a 81 year old white female with a history of a progressive memory disturbance that has been noticed by the family over the last 6 months. The patient has been living alone, she has not driven a car for at least 3 years. She can having problems with keeping up with finances, she was not paying her bills. The family has taken over this over the last 2 months. The patient has required assistance keeping up with medications and appointments as well. She has had difficulty cooking, she cannot figure out how to use her oven anymore. The patient is now healing up food in the microwave. The patient had not been eating well, she was losing weight and became dehydrated necessitating an emergency room visit on 05/05/2017. The patient is back to eating better, her family is supervising this. The patient her self does not have any understanding that she has a memory problem. The patient does report some generalized  fatigue and weakness, she is sleeping fairly well at night. She does have a mild gait disorder, she is not had any falls. She has not had any significant issues controlling the bowels or the bladder. The patient has undergone a recent CT scan of brain that shows generalized atrophy with hydrocephalus ex vacuo. Blood work recently was done that included a vitamin B12 level and thyroid profile that was unremarkable. The patient is on B12 supplementation. She is sent to this office for further evaluation.  REVIEW OF SYSTEMS: Out of a complete 14 system review of symptoms, the patient complains only of the following symptoms, and all other reviewed systems are negative.  See HPI  ALLERGIES: Allergies  Allergen Reactions  . Naproxen Hives    HOME MEDICATIONS: Outpatient Medications Prior to Visit  Medication Sig Dispense Refill  . acetaminophen (TYLENOL) 325 MG tablet Take 2 tablets (650 mg total) by mouth every 6 (six) hours as needed for mild pain (or Fever >/= 101).    [name] amLODipine (NORVASC) 2.5 MG tablet Take 1 tablet (2.5 mg total) by mouth daily. (Patient not taking: Reported on 09/11/2017) 30 tablet 0  . Cholecalciferol (VITAMIN D PO) Take 1,000 Units by mouth daily.     [name] docusate sodium (COLACE) 100 MG capsule Take 100 mg by mouth daily as needed for mild constipation.    [name] donepezil (ARICEPT) 5 MG tablet TAKE 1 TABLET BY MOUTH AT BEDTIME (Patient not taking: Reported on  09/11/2017) 30 tablet 2  . DULoxetine (CYMBALTA) 30 MG capsule Take 30 mg by mouth daily.     . folic acid (FOLVITE) 1 MG tablet Take 1 mg by mouth daily.    . furosemide (LASIX) 20 MG tablet Take 1 tablet (20 mg total) by mouth daily. 10 tablet 0  . levothyroxine (SYNTHROID, LEVOTHROID) 88 MCG tablet Take 88 mcg by mouth every other day.     . methotrexate 2.5 MG tablet Take 10 mg by mouth once a week. THURSDAYS    . potassium chloride (K-DUR) 10 MEQ tablet Take 1 tablet (10 mEq total) by mouth daily. 10 tablet 0  .  predniSONE (DELTASONE) 2.5 MG tablet Take 2.5 mg by mouth daily with breakfast.    . traMADol (ULTRAM) 50 MG tablet Take 1 tablet (50 mg total) by mouth every 6 (six) hours as needed for severe pain. 60 tablet 0  . vitamin B-12 (CYANOCOBALAMIN) 500 MCG tablet Take 500 mcg by mouth daily.     No facility-administered medications prior to visit.     PAST MEDICAL HISTORY: Past Medical History:  Diagnosis Date  . Alzheimer disease 05/16/2017  . Anemia   . Breast CA ([place])   . Cancer (HCC)    breast  . High cholesterol   . HTN (hypertension) 08/10/2017  . Iron deficiency   . Memory loss   . Meniere disease   . Multiple falls   . Myalgia   . OA (osteoarthritis) of knee   . Peripheral edema   . RA (rheumatoid arthritis) (HCC)    Dr. Trudie Reed  . Thyroid disease    hypothyroid  . Vitamin B 12 deficiency     PAST SURGICAL HISTORY: Past Surgical History:  Procedure Laterality Date  . BREAST LUMPECTOMY    . CATARACT EXTRACTION      FAMILY HISTORY: Family History  Problem Relation Age of Onset  . Arthritis Mother   . Diabetes Mother   . Cirrhosis Sister   . Cirrhosis Brother     SOCIAL HISTORY: Social History   Socioeconomic History  . Marital status: Married    Spouse name: Not on file  . Number of children: 3  . Years of education: 9  . Highest education level: Not on file  Social Needs  . Financial resource strain: Not on file  . Food insecurity - worry: Not on file  . Food insecurity - inability: Not on file  . Transportation needs - medical: Not on file  . Transportation needs - non-medical: Not on file  Occupational History  . Not on file  Tobacco Use  . Smoking status: Never Smoker  . Smokeless tobacco: Never Used  Substance and Sexual Activity  . Alcohol use: No  . Drug use: No  . Sexual activity: Not on file  Other Topics Concern  . Not on file  Social History Narrative   Lives alone   Caffeine use: Drinks coffee (daily)/hot chocolate   Left handed        PHYSICAL EXAM  Vitals:   10/18/17 0857  BP: (!) 163/75  Pulse: 73  Weight: 104 lb (47.2 kg)   Body mass index is 17.31 kg/m.   MMSE - Mini Mental State Exam 10/18/2017 05/16/2017  Orientation to time 0 2  Orientation to Place 3 3  Registration 3 3  Attention/ Calculation 0 2  Recall 0 0  Language- name 2 objects 2 2  Language- repeat 1 1  Language- follow 3 step command  1 3  Language- follow 3 step command-comments folded twice, handed to RN -  Language- read & follow direction 1 1  Write a sentence 1 1  Copy design 0 0  Total score 12 18    Generalized: Well developed, in no acute distress   Neurological examination  Mentation: Alert. Follows all commands speech and language fluent Cranial nerve II-XII: Pupils were equal round reactive to light. Extraocular movements were full, visual field were full on confrontational test. Facial sensation and strength were normal. Uvula tongue midline. Head turning and shoulder shrug  were normal and symmetric. Motor: The motor testing reveals 5 over 5 strength of all 4 extremities. Good symmetric motor tone is noted throughout.  Sensory: Sensory testing is intact to soft touch on all 4 extremities. No evidence of extinction is noted.  Coordination: Cerebellar testing reveals good finger-nose-finger and heel-to-shin bilaterally.  Gait and station: Patient uses a walker when ambulating.  Tandem gait not attempted. Reflexes: Deep tendon reflexes are symmetric and normal bilaterally.   DIAGNOSTIC DATA (LABS, IMAGING, TESTING) - I reviewed patient records, labs, notes, testing and imaging myself where available.  Lab Results  Component Value Date   WBC 7.9 09/11/2017   HGB 10.8 (L) 09/11/2017   HCT 33.9 (L) 09/11/2017   MCV 101.5 (H) 09/11/2017   PLT 503 (H) 09/11/2017      Component Value Date/Time   NA 135 09/11/2017 0926   K 3.7 09/11/2017 0926   CL 95 (L) 09/11/2017 0926   CO2 30 09/11/2017 0926   GLUCOSE 113 (H)  09/11/2017 0926   BUN 20 09/11/2017 0926   CREATININE 0.76 09/11/2017 0926   CALCIUM 9.4 09/11/2017 0926   PROT 5.9 (L) 08/03/2017 0356   ALBUMIN 3.0 (L) 08/03/2017 0356   AST 25 08/03/2017 0356   ALT 19 08/03/2017 0356   ALKPHOS 51 08/03/2017 0356   BILITOT 0.7 08/03/2017 0356   GFRNONAA >60 09/11/2017 0926   GFRAA >60 09/11/2017 0926      ASSESSMENT AND PLAN 81 y.o. year old female  has a past medical history of Alzheimer disease (05/16/2017), Anemia, Breast CA ([place]), Cancer ([place]), High cholesterol, HTN (hypertension) (08/10/2017), Iron deficiency, Memory loss, Meniere disease, Multiple falls, Myalgia, OA (osteoarthritis) of knee, Peripheral edema, RA (rheumatoid arthritis) ([place]), Thyroid disease, and Vitamin B 12 deficiency. here with:  1. Dementia  Patient's memory score has declined slightly.  She will restart on Aricept 5 mg at bedtime.  I have again reviewed the side effects with the patient and her son.  She tolerated it well before  So I do not suspect that she will have any issues.  I did advise that if she tolerates it well we can increase to 10 mg in 1 month.  Patient and her son voiced understanding.  She will follow-up in 6 months or sooner if needed.   I spent 15 minutes with the patient. 50% of this time was spent doing memory score.   Ward Givens, MSN, NP-C 10/18/2017, 9:08 AM [facility] [address], [place], [place] [address] [phone]

## 2017-10-18 NOTE — Progress Notes (Signed)
Received call from [place], med tech, [facility]. She stated that in patient's papers it stated if she tolerated Aricept 5 mg it may be increased to 10 mg in future. Benjamine Mola stated they cannot do that without new Rx. This RN advised that it would only be after a month trial, and patient's son was with her. Advised he will call if he thinks dose should be increased. If so, this office will send new Rx. Elizabeth verbalized understanding, appreciation.

## 2017-10-18 NOTE — Progress Notes (Signed)
I have read the note, and I agree with the clinical assessment and plan.  Britini Garcilazo K Dequavious Harshberger   

## 2017-10-18 NOTE — Patient Instructions (Signed)
Your Plan:  Restart Aricept 5 mg at bedtime If your symptoms worsen or you develop new symptoms please let us know.   Thank you for coming to see [place] at [facility]. I hope we have been able to provide you high quality care today.  You may receive a patient satisfaction survey over the next few weeks. We would appreciate your feedback and comments so that we may continue to improve ourselves and the health of our patients.

## 2017-10-21 DIAGNOSIS — S329XXD Fracture of unspecified parts of lumbosacral spine and pelvis, subsequent encounter for fracture with routine healing: Secondary | ICD-10-CM | POA: Diagnosis not present

## 2017-10-21 DIAGNOSIS — R609 Edema, unspecified: Secondary | ICD-10-CM | POA: Diagnosis not present

## 2017-10-21 DIAGNOSIS — S32019D Unspecified fracture of first lumbar vertebra, subsequent encounter for fracture with routine healing: Secondary | ICD-10-CM | POA: Diagnosis not present

## 2017-10-21 DIAGNOSIS — R413 Other amnesia: Secondary | ICD-10-CM | POA: Diagnosis not present

## 2017-10-21 DIAGNOSIS — M069 Rheumatoid arthritis, unspecified: Secondary | ICD-10-CM | POA: Diagnosis not present

## 2017-10-21 DIAGNOSIS — I10 Essential (primary) hypertension: Secondary | ICD-10-CM | POA: Diagnosis not present

## 2017-10-24 DIAGNOSIS — R609 Edema, unspecified: Secondary | ICD-10-CM | POA: Diagnosis not present

## 2017-10-24 DIAGNOSIS — R413 Other amnesia: Secondary | ICD-10-CM | POA: Diagnosis not present

## 2017-10-24 DIAGNOSIS — S329XXD Fracture of unspecified parts of lumbosacral spine and pelvis, subsequent encounter for fracture with routine healing: Secondary | ICD-10-CM | POA: Diagnosis not present

## 2017-10-24 DIAGNOSIS — M069 Rheumatoid arthritis, unspecified: Secondary | ICD-10-CM | POA: Diagnosis not present

## 2017-10-24 DIAGNOSIS — S32019D Unspecified fracture of first lumbar vertebra, subsequent encounter for fracture with routine healing: Secondary | ICD-10-CM | POA: Diagnosis not present

## 2017-10-24 DIAGNOSIS — I10 Essential (primary) hypertension: Secondary | ICD-10-CM | POA: Diagnosis not present

## 2017-10-25 DIAGNOSIS — I4891 Unspecified atrial fibrillation: Secondary | ICD-10-CM | POA: Diagnosis not present

## 2017-10-25 DIAGNOSIS — I499 Cardiac arrhythmia, unspecified: Secondary | ICD-10-CM | POA: Diagnosis not present

## 2017-10-25 DIAGNOSIS — M05611 Rheumatoid arthritis of right shoulder with involvement of other organs and systems: Secondary | ICD-10-CM | POA: Diagnosis not present

## 2017-10-25 DIAGNOSIS — E039 Hypothyroidism, unspecified: Secondary | ICD-10-CM | POA: Diagnosis not present

## 2017-10-25 DIAGNOSIS — R600 Localized edema: Secondary | ICD-10-CM | POA: Diagnosis not present

## 2017-10-25 DIAGNOSIS — E785 Hyperlipidemia, unspecified: Secondary | ICD-10-CM | POA: Diagnosis not present

## 2017-10-25 DIAGNOSIS — R4182 Altered mental status, unspecified: Secondary | ICD-10-CM | POA: Diagnosis not present

## 2017-10-25 DIAGNOSIS — I10 Essential (primary) hypertension: Secondary | ICD-10-CM | POA: Diagnosis not present

## 2017-10-25 DIAGNOSIS — G8929 Other chronic pain: Secondary | ICD-10-CM | POA: Diagnosis not present

## 2017-10-25 DIAGNOSIS — E559 Vitamin D deficiency, unspecified: Secondary | ICD-10-CM | POA: Diagnosis not present

## 2017-10-25 DIAGNOSIS — K5900 Constipation, unspecified: Secondary | ICD-10-CM | POA: Diagnosis not present

## 2017-10-25 DIAGNOSIS — D519 Vitamin B12 deficiency anemia, unspecified: Secondary | ICD-10-CM | POA: Diagnosis not present

## 2017-10-26 DIAGNOSIS — R601 Generalized edema: Secondary | ICD-10-CM | POA: Diagnosis not present

## 2017-10-26 DIAGNOSIS — R768 Other specified abnormal immunological findings in serum: Secondary | ICD-10-CM | POA: Diagnosis not present

## 2017-10-26 DIAGNOSIS — R636 Underweight: Secondary | ICD-10-CM | POA: Diagnosis not present

## 2017-10-26 DIAGNOSIS — M2550 Pain in unspecified joint: Secondary | ICD-10-CM | POA: Diagnosis not present

## 2017-10-26 DIAGNOSIS — R5382 Chronic fatigue, unspecified: Secondary | ICD-10-CM | POA: Diagnosis not present

## 2017-10-26 DIAGNOSIS — Z681 Body mass index (BMI) 19 or less, adult: Secondary | ICD-10-CM | POA: Diagnosis not present

## 2017-10-26 DIAGNOSIS — R531 Weakness: Secondary | ICD-10-CM | POA: Diagnosis not present

## 2017-10-26 DIAGNOSIS — M0609 Rheumatoid arthritis without rheumatoid factor, multiple sites: Secondary | ICD-10-CM | POA: Diagnosis not present

## 2017-10-31 DIAGNOSIS — F331 Major depressive disorder, recurrent, moderate: Secondary | ICD-10-CM | POA: Diagnosis not present

## 2017-10-31 DIAGNOSIS — G3184 Mild cognitive impairment, so stated: Secondary | ICD-10-CM | POA: Diagnosis not present

## 2017-11-01 DIAGNOSIS — R413 Other amnesia: Secondary | ICD-10-CM | POA: Diagnosis not present

## 2017-11-01 DIAGNOSIS — F331 Major depressive disorder, recurrent, moderate: Secondary | ICD-10-CM | POA: Diagnosis not present

## 2017-11-08 DIAGNOSIS — F331 Major depressive disorder, recurrent, moderate: Secondary | ICD-10-CM | POA: Diagnosis not present

## 2017-11-08 DIAGNOSIS — R413 Other amnesia: Secondary | ICD-10-CM | POA: Diagnosis not present

## 2017-11-10 DIAGNOSIS — E782 Mixed hyperlipidemia: Secondary | ICD-10-CM | POA: Diagnosis not present

## 2017-11-10 DIAGNOSIS — D518 Other vitamin B12 deficiency anemias: Secondary | ICD-10-CM | POA: Diagnosis not present

## 2017-11-10 DIAGNOSIS — E119 Type 2 diabetes mellitus without complications: Secondary | ICD-10-CM | POA: Diagnosis not present

## 2017-11-10 DIAGNOSIS — I10 Essential (primary) hypertension: Secondary | ICD-10-CM | POA: Diagnosis not present

## 2017-11-10 DIAGNOSIS — E559 Vitamin D deficiency, unspecified: Secondary | ICD-10-CM | POA: Diagnosis not present

## 2017-11-10 DIAGNOSIS — E039 Hypothyroidism, unspecified: Secondary | ICD-10-CM | POA: Diagnosis not present

## 2017-11-15 DIAGNOSIS — R600 Localized edema: Secondary | ICD-10-CM | POA: Diagnosis not present

## 2017-11-15 DIAGNOSIS — M25569 Pain in unspecified knee: Secondary | ICD-10-CM | POA: Diagnosis not present

## 2017-11-15 DIAGNOSIS — I4891 Unspecified atrial fibrillation: Secondary | ICD-10-CM | POA: Diagnosis not present

## 2017-11-15 DIAGNOSIS — R4182 Altered mental status, unspecified: Secondary | ICD-10-CM | POA: Diagnosis not present

## 2017-11-15 DIAGNOSIS — M05611 Rheumatoid arthritis of right shoulder with involvement of other organs and systems: Secondary | ICD-10-CM | POA: Diagnosis not present

## 2017-11-15 DIAGNOSIS — E785 Hyperlipidemia, unspecified: Secondary | ICD-10-CM | POA: Diagnosis not present

## 2017-11-15 DIAGNOSIS — I10 Essential (primary) hypertension: Secondary | ICD-10-CM | POA: Diagnosis not present

## 2017-11-15 DIAGNOSIS — I499 Cardiac arrhythmia, unspecified: Secondary | ICD-10-CM | POA: Diagnosis not present

## 2017-11-15 DIAGNOSIS — N6310 Unspecified lump in the right breast, unspecified quadrant: Secondary | ICD-10-CM | POA: Diagnosis not present

## 2017-11-15 DIAGNOSIS — K5900 Constipation, unspecified: Secondary | ICD-10-CM | POA: Diagnosis not present

## 2017-11-15 DIAGNOSIS — E039 Hypothyroidism, unspecified: Secondary | ICD-10-CM | POA: Diagnosis not present

## 2017-11-15 DIAGNOSIS — F331 Major depressive disorder, recurrent, moderate: Secondary | ICD-10-CM | POA: Diagnosis not present

## 2017-11-15 DIAGNOSIS — R413 Other amnesia: Secondary | ICD-10-CM | POA: Diagnosis not present

## 2017-11-15 DIAGNOSIS — D519 Vitamin B12 deficiency anemia, unspecified: Secondary | ICD-10-CM | POA: Diagnosis not present

## 2017-11-30 DIAGNOSIS — G301 Alzheimer's disease with late onset: Secondary | ICD-10-CM | POA: Diagnosis not present

## 2017-11-30 DIAGNOSIS — F331 Major depressive disorder, recurrent, moderate: Secondary | ICD-10-CM | POA: Diagnosis not present

## 2017-11-30 DIAGNOSIS — G894 Chronic pain syndrome: Secondary | ICD-10-CM | POA: Diagnosis not present

## 2017-11-30 DIAGNOSIS — F0281 Dementia in other diseases classified elsewhere with behavioral disturbance: Secondary | ICD-10-CM | POA: Diagnosis not present

## 2017-12-01 DIAGNOSIS — R413 Other amnesia: Secondary | ICD-10-CM | POA: Diagnosis not present

## 2017-12-01 DIAGNOSIS — F331 Major depressive disorder, recurrent, moderate: Secondary | ICD-10-CM | POA: Diagnosis not present

## 2017-12-23 DIAGNOSIS — N390 Urinary tract infection, site not specified: Secondary | ICD-10-CM | POA: Diagnosis not present

## 2017-12-26 DIAGNOSIS — M170 Bilateral primary osteoarthritis of knee: Secondary | ICD-10-CM | POA: Diagnosis not present

## 2017-12-27 DIAGNOSIS — H18413 Arcus senilis, bilateral: Secondary | ICD-10-CM | POA: Diagnosis not present

## 2017-12-27 DIAGNOSIS — H35033 Hypertensive retinopathy, bilateral: Secondary | ICD-10-CM | POA: Diagnosis not present

## 2017-12-27 DIAGNOSIS — Z961 Presence of intraocular lens: Secondary | ICD-10-CM | POA: Diagnosis not present

## 2017-12-27 DIAGNOSIS — R413 Other amnesia: Secondary | ICD-10-CM | POA: Diagnosis not present

## 2017-12-27 DIAGNOSIS — H353131 Nonexudative age-related macular degeneration, bilateral, early dry stage: Secondary | ICD-10-CM | POA: Diagnosis not present

## 2017-12-27 DIAGNOSIS — Z0000 Encounter for general adult medical examination without abnormal findings: Secondary | ICD-10-CM | POA: Diagnosis not present

## 2017-12-27 DIAGNOSIS — F331 Major depressive disorder, recurrent, moderate: Secondary | ICD-10-CM | POA: Diagnosis not present

## 2017-12-27 DIAGNOSIS — H43813 Vitreous degeneration, bilateral: Secondary | ICD-10-CM | POA: Diagnosis not present

## 2018-01-05 DIAGNOSIS — M170 Bilateral primary osteoarthritis of knee: Secondary | ICD-10-CM | POA: Diagnosis not present

## 2018-01-10 DIAGNOSIS — F331 Major depressive disorder, recurrent, moderate: Secondary | ICD-10-CM | POA: Diagnosis not present

## 2018-01-10 DIAGNOSIS — F0281 Dementia in other diseases classified elsewhere with behavioral disturbance: Secondary | ICD-10-CM | POA: Diagnosis not present

## 2018-01-10 DIAGNOSIS — J111 Influenza due to unidentified influenza virus with other respiratory manifestations: Secondary | ICD-10-CM | POA: Diagnosis not present

## 2018-01-10 DIAGNOSIS — G894 Chronic pain syndrome: Secondary | ICD-10-CM | POA: Diagnosis not present

## 2018-01-10 DIAGNOSIS — G301 Alzheimer's disease with late onset: Secondary | ICD-10-CM | POA: Diagnosis not present

## 2018-01-20 ENCOUNTER — Emergency Department (HOSPITAL_COMMUNITY)
Admission: EM | Admit: 2018-01-20 | Discharge: 2018-01-20 | Disposition: A | Discharge status: Home / Self Care | Payer: Medicare Other | Attending: Emergency Medicine | Admitting: Emergency Medicine

## 2018-01-20 ENCOUNTER — Other Ambulatory Visit: Payer: Self-pay

## 2018-01-20 ENCOUNTER — Emergency Department (HOSPITAL_COMMUNITY): Payer: Medicare Other

## 2018-01-20 DIAGNOSIS — Z79899 Other long term (current) drug therapy: Secondary | ICD-10-CM | POA: Insufficient documentation

## 2018-01-20 DIAGNOSIS — Z7982 Long term (current) use of aspirin: Secondary | ICD-10-CM | POA: Insufficient documentation

## 2018-01-20 DIAGNOSIS — R079 Chest pain, unspecified: Secondary | ICD-10-CM | POA: Insufficient documentation

## 2018-01-20 DIAGNOSIS — I10 Essential (primary) hypertension: Secondary | ICD-10-CM | POA: Insufficient documentation

## 2018-01-20 DIAGNOSIS — R451 Restlessness and agitation: Secondary | ICD-10-CM | POA: Diagnosis not present

## 2018-01-20 DIAGNOSIS — G309 Alzheimer's disease, unspecified: Secondary | ICD-10-CM | POA: Insufficient documentation

## 2018-01-20 DIAGNOSIS — R0600 Dyspnea, unspecified: Secondary | ICD-10-CM | POA: Insufficient documentation

## 2018-01-20 DIAGNOSIS — E039 Hypothyroidism, unspecified: Secondary | ICD-10-CM | POA: Diagnosis not present

## 2018-01-20 DIAGNOSIS — R000 Tachycardia, unspecified: Secondary | ICD-10-CM | POA: Diagnosis present

## 2018-01-20 DIAGNOSIS — R002 Palpitations: Secondary | ICD-10-CM

## 2018-01-20 DIAGNOSIS — I6789 Other cerebrovascular disease: Secondary | ICD-10-CM | POA: Diagnosis not present

## 2018-01-20 LAB — BASIC METABOLIC PANEL
Anion gap: 14 (ref 5–15)
BUN: 14 mg/dL (ref 6–20)
CHLORIDE: 96 mmol/L — AB (ref 101–111)
CO2: 23 mmol/L (ref 22–32)
CREATININE: 0.79 mg/dL (ref 0.44–1.00)
Calcium: 9.5 mg/dL (ref 8.9–10.3)
Glucose, Bld: 109 mg/dL — ABNORMAL HIGH (ref 65–99)
POTASSIUM: 3.6 mmol/L (ref 3.5–5.1)
SODIUM: 133 mmol/L — AB (ref 135–145)

## 2018-01-20 LAB — CBC
HCT: 34.8 % — ABNORMAL LOW (ref 36.0–46.0)
Hemoglobin: 11.5 g/dL — ABNORMAL LOW (ref 12.0–15.0)
MCH: 32.6 pg (ref 26.0–34.0)
MCHC: 33 g/dL (ref 30.0–36.0)
MCV: 98.6 fL (ref 78.0–100.0)
PLATELETS: 395 10*3/uL (ref 150–400)
RBC: 3.53 MIL/uL — AB (ref 3.87–5.11)
RDW: 15.2 % (ref 11.5–15.5)
WBC: 9.7 10*3/uL (ref 4.0–10.5)

## 2018-01-20 LAB — I-STAT TROPONIN, ED: TROPONIN I, POC: 0.01 ng/mL (ref 0.00–0.08)

## 2018-01-20 NOTE — Discharge Instructions (Signed)
Tests showed no life-threatening condition.  No change in medications for the time being.  Follow-up with your primary care doctor.

## 2018-01-20 NOTE — ED Notes (Signed)
Patient transported to facility by her son and daughter-in law. D/c reviewed with family. No further question

## 2018-01-20 NOTE — ED Triage Notes (Addendum)
Arrived from [facility] facility via [address], received Asprin before EMS arrival pt initally presented with chest pain, hyperventilation, and anxiety. Pt has hx of dementia. Complaints of pain where touched. Vital signs NSR; HR 88' Spo2 99 on RA CBG 140  18 RAC

## 2018-01-20 NOTE — ED Provider Notes (Signed)
[facility] EMERGENCY DEPARTMENT Provider Note   CSN: [id number] Arrival date & time: 01/20/18  8299     History   Chief Complaint No chief complaint on file.   HPI Charlene Duncan is a 82 y.o. female.  Level 5 caveat for dementia.  Most of history obtained from daughter-in-law.  Patient complains of heart racing, dyspnea, chest pain earlier today.  Symptoms have completely resolved.  Daughter-in-law states she is back to her normal self.  No prodromal illnesses.  No fever, sweats, chills, dysuria, nausea, vomiting, diarrhea.      Past Medical History:  Diagnosis Date  . Alzheimer disease 05/16/2017  . Anemia   . Breast CA ([place])   . Cancer (HCC)    breast  . High cholesterol   . HTN (hypertension) 08/10/2017  . Iron deficiency   . Memory loss   . Meniere disease   . Multiple falls   . Myalgia   . OA (osteoarthritis) of knee   . Peripheral edema   . RA (rheumatoid arthritis) (HCC)    Dr. Trudie Reed  . Thyroid disease    hypothyroid  . Vitamin B 12 deficiency     Patient Active Problem List   Diagnosis Date Noted  . HTN (hypertension) 08/10/2017  . Lumbar compression fracture ([place]) 08/02/2017  . Closed fracture of multiple pubic rami, right, sequela   . Anemia 08/01/2017  . Hyperglycemia 08/01/2017  . Compression fracture of first lumbar vertebra ([place]) 07/31/2017  . Alzheimer disease 05/16/2017  . Arthritis 06/16/2014  . Congenital hypothyroidism without goiter 06/16/2014  . History of breast cancer 06/16/2014  . Hyperlipidemia 06/16/2014  . Iron deficiency 06/16/2014    Past Surgical History:  Procedure Laterality Date  . BREAST LUMPECTOMY    . CATARACT EXTRACTION      OB History    No data available       Home Medications    Prior to Admission medications   Medication Sig Start Date End Date Taking? Authorizing Provider  aspirin EC 81 MG tablet Take 81 mg daily by mouth.   Yes [provider]  Cholecalciferol  (VITAMIN D PO) Take 1,000 Units by mouth daily.    Yes [provider]  diclofenac sodium (VOLTAREN) 1 % GEL Apply 2 g topically 2 (two) times daily.   Yes [provider]  docusate sodium (COLACE) 100 MG capsule Take 100 mg by mouth daily as needed for mild constipation.   Yes [provider]  donepezil (ARICEPT) 5 MG tablet Take 1 tablet (5 mg total) at bedtime by mouth. 10/18/17  Yes Millikan, Megan, NP  DULoxetine (CYMBALTA) 30 MG capsule Take 30 mg by mouth daily.  06/04/17  Yes [provider]  folic acid (FOLVITE) 1 MG tablet Take 1 mg by mouth daily.   Yes [provider]  furosemide (LASIX) 20 MG tablet Take 1 tablet (20 mg total) by mouth daily. 09/11/17  Yes Daleen Bo, MD  levothyroxine (SYNTHROID, LEVOTHROID) 88 MCG tablet Take 88 mcg by mouth every other day.  03/21/17  Yes [provider]  Melatonin 3 MG CAPS Take 3 mg by mouth at bedtime.   Yes [provider]  metoprolol tartrate (LOPRESSOR) 25 MG tablet 12.5 mg daily. 10/04/17  Yes [provider]  traMADol (ULTRAM) 50 MG tablet Take 1 tablet (50 mg total) by mouth every 6 (six) hours as needed for severe pain. 08/10/17  Yes Love, Ivan Anchors, PA-C  vitamin B-12 (CYANOCOBALAMIN) 500 MCG  tablet Take 500 mcg by mouth daily.   Yes [provider]  acetaminophen (TYLENOL) 325 MG tablet Take 2 tablets (650 mg total) by mouth every 6 (six) hours as needed for mild pain (or Fever >/= 101). 08/02/17   [name], MD  amLODipine (NORVASC) 2.5 MG tablet Take 1 tablet (2.5 mg total) by mouth daily. Patient not taking: Reported on 09/11/2017 08/11/17   Love, Ivan Anchors, PA-C  potassium chloride (K-DUR) 10 MEQ tablet Take 1 tablet (10 mEq total) by mouth daily. Patient not taking: Reported on 10/18/2017 09/11/17   Daleen Bo, MD    Family History Family History  Problem Relation Age of Onset  . Arthritis Mother   . Diabetes Mother   . Cirrhosis Sister   .  Cirrhosis Brother     Social History Social History   Tobacco Use  . Smoking status: Never Smoker  . Smokeless tobacco: Never Used  Substance Use Topics  . Alcohol use: No  . Drug use: No     Allergies   Naproxen   Review of Systems Review of Systems  Unable to perform ROS: Dementia     Physical Exam Updated Vital Signs Ht 5\' 6"  (1.676 m)   Wt 47.2 kg (104 lb)   BMI 16.79 kg/m   Physical Exam  Constitutional: She is oriented to person, place, and time. She appears well-developed and well-nourished.  nad  HENT:  Head: Normocephalic and atraumatic.  Eyes: Conjunctivae are normal.  Neck: Neck supple.  Cardiovascular: Normal rate and regular rhythm.  Pulmonary/Chest: Effort normal and breath sounds normal.  Abdominal: Soft. Bowel sounds are normal.  Musculoskeletal: Normal range of motion.  Neurological: She is alert and oriented to person, place, and time.  Skin: Skin is warm and dry.  Psychiatric: She has a normal mood and affect. Her behavior is normal.  Nursing note and vitals reviewed.    ED Treatments / Results  Labs (all labs ordered are listed, but only abnormal results are displayed) Labs Reviewed  BASIC METABOLIC PANEL - Abnormal; Notable for the following components:      Result Value   Sodium 133 (*)    Chloride 96 (*)    Glucose, Bld 109 (*)    All other components within normal limits  CBC - Abnormal; Notable for the following components:   RBC 3.53 (*)    Hemoglobin 11.5 (*)    HCT 34.8 (*)    All other components within normal limits  I-STAT TROPONIN, ED    EKG  EKG Interpretation  Date/Time:  Friday January 20 2018 09:19:19 EST Ventricular Rate:  79 PR Interval:    QRS Duration: 93 QT Interval:  384 QTC Calculation: 441 R Axis:   49 Text Interpretation:  Normal sinus rhythm Probable anteroseptal infarct, old Reconfirmed by Nat Christen [phone]) on 01/20/2018 1:24:50 PM       Radiology Dg Chest 2 View  Result Date:  01/20/2018 CLINICAL DATA:  Chest pain EXAM: CHEST  2 VIEW COMPARISON:  09/11/2017 FINDINGS: There are postsurgical changes along the right lateral chest wall with rib deformity. There is no focal consolidation. There is mild bilateral chronic interstitial thickening. There is no pleural effusion or pneumothorax. The heart and mediastinal contours are unremarkable. There is no acute osseous abnormality. IMPRESSION: No active cardiopulmonary disease. Electronically Signed   By: Kathreen Devoid   On: 01/20/2018 10:13    Procedures Procedures (including critical care time)  Medications Ordered in ED Medications - No data  to display   Initial Impression / Assessment and Plan / ED Course  I have reviewed the triage vital signs and the nursing notes.  Pertinent labs & imaging results that were available during my care of the patient were reviewed by me and considered in my medical decision making (see chart for details).     Patient presents with fleeting palpitations, chest pain, dyspnea.  EKG, chest x-ray, labs, troponin all acceptable.  Patient remained stable throughout her emergency course.  She is symptom-free at discharge.  Final Clinical Impressions(s) / ED Diagnoses   Final diagnoses:  Palpitations    ED Discharge Orders    None       Nat Christen, MD 01/20/18 1326

## 2018-01-23 DIAGNOSIS — G301 Alzheimer's disease with late onset: Secondary | ICD-10-CM | POA: Diagnosis not present

## 2018-01-23 DIAGNOSIS — F0281 Dementia in other diseases classified elsewhere with behavioral disturbance: Secondary | ICD-10-CM | POA: Diagnosis not present

## 2018-01-23 DIAGNOSIS — Z79899 Other long term (current) drug therapy: Secondary | ICD-10-CM | POA: Diagnosis not present

## 2018-01-23 DIAGNOSIS — F331 Major depressive disorder, recurrent, moderate: Secondary | ICD-10-CM | POA: Diagnosis not present

## 2018-01-24 DIAGNOSIS — N6310 Unspecified lump in the right breast, unspecified quadrant: Secondary | ICD-10-CM | POA: Diagnosis not present

## 2018-01-24 DIAGNOSIS — J111 Influenza due to unidentified influenza virus with other respiratory manifestations: Secondary | ICD-10-CM | POA: Diagnosis not present

## 2018-01-24 DIAGNOSIS — E785 Hyperlipidemia, unspecified: Secondary | ICD-10-CM | POA: Diagnosis not present

## 2018-01-24 DIAGNOSIS — I4891 Unspecified atrial fibrillation: Secondary | ICD-10-CM | POA: Diagnosis not present

## 2018-01-24 DIAGNOSIS — I499 Cardiac arrhythmia, unspecified: Secondary | ICD-10-CM | POA: Diagnosis not present

## 2018-01-24 DIAGNOSIS — R4182 Altered mental status, unspecified: Secondary | ICD-10-CM | POA: Diagnosis not present

## 2018-01-24 DIAGNOSIS — M05611 Rheumatoid arthritis of right shoulder with involvement of other organs and systems: Secondary | ICD-10-CM | POA: Diagnosis not present

## 2018-01-24 DIAGNOSIS — K5900 Constipation, unspecified: Secondary | ICD-10-CM | POA: Diagnosis not present

## 2018-01-24 DIAGNOSIS — I10 Essential (primary) hypertension: Secondary | ICD-10-CM | POA: Diagnosis not present

## 2018-01-24 DIAGNOSIS — M25569 Pain in unspecified knee: Secondary | ICD-10-CM | POA: Diagnosis not present

## 2018-01-24 DIAGNOSIS — R079 Chest pain, unspecified: Secondary | ICD-10-CM | POA: Diagnosis not present

## 2018-01-24 DIAGNOSIS — E039 Hypothyroidism, unspecified: Secondary | ICD-10-CM | POA: Diagnosis not present

## 2018-01-25 DIAGNOSIS — F331 Major depressive disorder, recurrent, moderate: Secondary | ICD-10-CM | POA: Diagnosis not present

## 2018-01-25 DIAGNOSIS — R413 Other amnesia: Secondary | ICD-10-CM | POA: Diagnosis not present

## 2018-01-30 DIAGNOSIS — M2550 Pain in unspecified joint: Secondary | ICD-10-CM | POA: Diagnosis not present

## 2018-01-30 DIAGNOSIS — M0609 Rheumatoid arthritis without rheumatoid factor, multiple sites: Secondary | ICD-10-CM | POA: Diagnosis not present

## 2018-01-30 DIAGNOSIS — R768 Other specified abnormal immunological findings in serum: Secondary | ICD-10-CM | POA: Diagnosis not present

## 2018-01-30 DIAGNOSIS — R636 Underweight: Secondary | ICD-10-CM | POA: Diagnosis not present

## 2018-01-30 DIAGNOSIS — Z681 Body mass index (BMI) 19 or less, adult: Secondary | ICD-10-CM | POA: Diagnosis not present

## 2018-01-30 DIAGNOSIS — Z79899 Other long term (current) drug therapy: Secondary | ICD-10-CM | POA: Diagnosis not present

## 2018-01-31 DIAGNOSIS — R413 Other amnesia: Secondary | ICD-10-CM | POA: Diagnosis not present

## 2018-01-31 DIAGNOSIS — F331 Major depressive disorder, recurrent, moderate: Secondary | ICD-10-CM | POA: Diagnosis not present

## 2018-02-07 DIAGNOSIS — G301 Alzheimer's disease with late onset: Secondary | ICD-10-CM | POA: Diagnosis not present

## 2018-02-07 DIAGNOSIS — F0281 Dementia in other diseases classified elsewhere with behavioral disturbance: Secondary | ICD-10-CM | POA: Diagnosis not present

## 2018-02-07 DIAGNOSIS — F331 Major depressive disorder, recurrent, moderate: Secondary | ICD-10-CM | POA: Diagnosis not present

## 2018-02-21 DIAGNOSIS — J111 Influenza due to unidentified influenza virus with other respiratory manifestations: Secondary | ICD-10-CM | POA: Diagnosis not present

## 2018-02-21 DIAGNOSIS — I499 Cardiac arrhythmia, unspecified: Secondary | ICD-10-CM | POA: Diagnosis not present

## 2018-02-21 DIAGNOSIS — I4891 Unspecified atrial fibrillation: Secondary | ICD-10-CM | POA: Diagnosis not present

## 2018-02-21 DIAGNOSIS — K5900 Constipation, unspecified: Secondary | ICD-10-CM | POA: Diagnosis not present

## 2018-02-21 DIAGNOSIS — M05611 Rheumatoid arthritis of right shoulder with involvement of other organs and systems: Secondary | ICD-10-CM | POA: Diagnosis not present

## 2018-02-21 DIAGNOSIS — E039 Hypothyroidism, unspecified: Secondary | ICD-10-CM | POA: Diagnosis not present

## 2018-02-21 DIAGNOSIS — I10 Essential (primary) hypertension: Secondary | ICD-10-CM | POA: Diagnosis not present

## 2018-02-21 DIAGNOSIS — R079 Chest pain, unspecified: Secondary | ICD-10-CM | POA: Diagnosis not present

## 2018-02-21 DIAGNOSIS — R4182 Altered mental status, unspecified: Secondary | ICD-10-CM | POA: Diagnosis not present

## 2018-02-21 DIAGNOSIS — E785 Hyperlipidemia, unspecified: Secondary | ICD-10-CM | POA: Diagnosis not present

## 2018-02-21 DIAGNOSIS — N6310 Unspecified lump in the right breast, unspecified quadrant: Secondary | ICD-10-CM | POA: Diagnosis not present

## 2018-02-21 DIAGNOSIS — M25569 Pain in unspecified knee: Secondary | ICD-10-CM | POA: Diagnosis not present

## 2018-03-09 DIAGNOSIS — R413 Other amnesia: Secondary | ICD-10-CM | POA: Diagnosis not present

## 2018-03-09 DIAGNOSIS — F331 Major depressive disorder, recurrent, moderate: Secondary | ICD-10-CM | POA: Diagnosis not present

## 2018-03-14 DIAGNOSIS — F331 Major depressive disorder, recurrent, moderate: Secondary | ICD-10-CM | POA: Diagnosis not present

## 2018-03-14 DIAGNOSIS — R413 Other amnesia: Secondary | ICD-10-CM | POA: Diagnosis not present

## 2018-03-21 DIAGNOSIS — R413 Other amnesia: Secondary | ICD-10-CM | POA: Diagnosis not present

## 2018-03-21 DIAGNOSIS — F331 Major depressive disorder, recurrent, moderate: Secondary | ICD-10-CM | POA: Diagnosis not present

## 2018-03-28 DIAGNOSIS — K5900 Constipation, unspecified: Secondary | ICD-10-CM | POA: Diagnosis not present

## 2018-03-28 DIAGNOSIS — I10 Essential (primary) hypertension: Secondary | ICD-10-CM | POA: Diagnosis not present

## 2018-03-28 DIAGNOSIS — J111 Influenza due to unidentified influenza virus with other respiratory manifestations: Secondary | ICD-10-CM | POA: Diagnosis not present

## 2018-03-28 DIAGNOSIS — R413 Other amnesia: Secondary | ICD-10-CM | POA: Diagnosis not present

## 2018-03-28 DIAGNOSIS — N6310 Unspecified lump in the right breast, unspecified quadrant: Secondary | ICD-10-CM | POA: Diagnosis not present

## 2018-03-28 DIAGNOSIS — E039 Hypothyroidism, unspecified: Secondary | ICD-10-CM | POA: Diagnosis not present

## 2018-03-28 DIAGNOSIS — R600 Localized edema: Secondary | ICD-10-CM | POA: Diagnosis not present

## 2018-03-28 DIAGNOSIS — R079 Chest pain, unspecified: Secondary | ICD-10-CM | POA: Diagnosis not present

## 2018-03-28 DIAGNOSIS — I4891 Unspecified atrial fibrillation: Secondary | ICD-10-CM | POA: Diagnosis not present

## 2018-03-28 DIAGNOSIS — M25569 Pain in unspecified knee: Secondary | ICD-10-CM | POA: Diagnosis not present

## 2018-03-28 DIAGNOSIS — M05611 Rheumatoid arthritis of right shoulder with involvement of other organs and systems: Secondary | ICD-10-CM | POA: Diagnosis not present

## 2018-03-28 DIAGNOSIS — I499 Cardiac arrhythmia, unspecified: Secondary | ICD-10-CM | POA: Diagnosis not present

## 2018-03-28 DIAGNOSIS — Z79899 Other long term (current) drug therapy: Secondary | ICD-10-CM | POA: Diagnosis not present

## 2018-03-28 DIAGNOSIS — E785 Hyperlipidemia, unspecified: Secondary | ICD-10-CM | POA: Diagnosis not present

## 2018-03-28 DIAGNOSIS — F331 Major depressive disorder, recurrent, moderate: Secondary | ICD-10-CM | POA: Diagnosis not present

## 2018-04-04 DIAGNOSIS — L853 Xerosis cutis: Secondary | ICD-10-CM | POA: Diagnosis not present

## 2018-04-04 DIAGNOSIS — M2010 Hallux valgus (acquired), unspecified foot: Secondary | ICD-10-CM | POA: Diagnosis not present

## 2018-04-04 DIAGNOSIS — L603 Nail dystrophy: Secondary | ICD-10-CM | POA: Diagnosis not present

## 2018-04-04 DIAGNOSIS — B351 Tinea unguium: Secondary | ICD-10-CM | POA: Diagnosis not present

## 2018-04-04 DIAGNOSIS — I739 Peripheral vascular disease, unspecified: Secondary | ICD-10-CM | POA: Diagnosis not present

## 2018-04-04 DIAGNOSIS — Q845 Enlarged and hypertrophic nails: Secondary | ICD-10-CM | POA: Diagnosis not present

## 2018-04-11 DIAGNOSIS — G301 Alzheimer's disease with late onset: Secondary | ICD-10-CM | POA: Diagnosis not present

## 2018-04-11 DIAGNOSIS — E785 Hyperlipidemia, unspecified: Secondary | ICD-10-CM | POA: Diagnosis not present

## 2018-04-11 DIAGNOSIS — F0281 Dementia in other diseases classified elsewhere with behavioral disturbance: Secondary | ICD-10-CM | POA: Diagnosis not present

## 2018-04-11 DIAGNOSIS — N6310 Unspecified lump in the right breast, unspecified quadrant: Secondary | ICD-10-CM | POA: Diagnosis not present

## 2018-04-11 DIAGNOSIS — I499 Cardiac arrhythmia, unspecified: Secondary | ICD-10-CM | POA: Diagnosis not present

## 2018-04-11 DIAGNOSIS — M05611 Rheumatoid arthritis of right shoulder with involvement of other organs and systems: Secondary | ICD-10-CM | POA: Diagnosis not present

## 2018-04-11 DIAGNOSIS — I10 Essential (primary) hypertension: Secondary | ICD-10-CM | POA: Diagnosis not present

## 2018-04-11 DIAGNOSIS — M25569 Pain in unspecified knee: Secondary | ICD-10-CM | POA: Diagnosis not present

## 2018-04-11 DIAGNOSIS — E039 Hypothyroidism, unspecified: Secondary | ICD-10-CM | POA: Diagnosis not present

## 2018-04-11 DIAGNOSIS — F331 Major depressive disorder, recurrent, moderate: Secondary | ICD-10-CM | POA: Diagnosis not present

## 2018-04-11 DIAGNOSIS — R600 Localized edema: Secondary | ICD-10-CM | POA: Diagnosis not present

## 2018-04-11 DIAGNOSIS — J111 Influenza due to unidentified influenza virus with other respiratory manifestations: Secondary | ICD-10-CM | POA: Diagnosis not present

## 2018-04-11 DIAGNOSIS — R079 Chest pain, unspecified: Secondary | ICD-10-CM | POA: Diagnosis not present

## 2018-04-11 DIAGNOSIS — I4891 Unspecified atrial fibrillation: Secondary | ICD-10-CM | POA: Diagnosis not present

## 2018-04-11 DIAGNOSIS — K5900 Constipation, unspecified: Secondary | ICD-10-CM | POA: Diagnosis not present

## 2018-04-12 ENCOUNTER — Encounter: Payer: Self-pay | Admitting: Adult Health

## 2018-04-12 ENCOUNTER — Ambulatory Visit (INDEPENDENT_AMBULATORY_CARE_PROVIDER_SITE_OTHER): Payer: Medicare Other | Admitting: Adult Health

## 2018-04-12 VITALS — BP 144/76 | HR 72 | Ht 66.0 in | Wt 121.0 lb

## 2018-04-12 DIAGNOSIS — F0280 Dementia in other diseases classified elsewhere without behavioral disturbance: Secondary | ICD-10-CM | POA: Diagnosis not present

## 2018-04-12 DIAGNOSIS — G301 Alzheimer's disease with late onset: Secondary | ICD-10-CM | POA: Diagnosis not present

## 2018-04-12 NOTE — Progress Notes (Signed)
I have read the note, and I agree with the clinical assessment and plan.  Malaak Stach K Verdun Rackley   

## 2018-04-12 NOTE — Progress Notes (Signed)
PATIENT: Charlene Duncan DOB: 11-29-1936  REASON FOR VISIT: follow up HISTORY FROM: patient  HISTORY OF PRESENT ILLNESS: Today 04/12/18 Charlene Duncan is an 82 year old female with a history of progressive memory disturbance.  She returns today for follow-up.  She continues to live at [facility].  She is able to complete all ADLs independently.  She does have some supervision with showering.  The patient no longer operates a motor vehicle.  She does have some trouble sleeping.  She reports that she tends to be up a lot at night but does sleep late in the mornings.  Her medication list indicates that she received Ativan around 2 PM scheduled.  Her son reports that there was some issue of minimal agitation but he has not noticed this recently.  The son is unsure if she is sleeping a lot during the day.  Patient remains on Aricept 10 mg daily.  She returns today for an evaluation.  HISTORY: 10/18/17  Charlene Duncan is a 82 year old female with a history of progressive memory disturbance.  She returns today for follow-up.  Her son is with her today.  He reports that she suffered a fall and broke her pelvis.  After that it was recommended that she go to an extended care facility.  She is now at [facility].  He feels that her memory has declined.  She does require assistance with ADLs.  She can feed herself although she reports that she does not eat that much.  She states that she does not always sleep well.  Reports that with any sounds she wakes up.  She denies hallucinations.  Her family manages her finances.  Her son states that when she was moved over to [facility] for some reason the Aricept did not get prescribed.  She has not been taking this medication for approximately 1-1/2 months.  She returns today for follow-up.   REVIEW OF SYSTEMS: Out of a complete 14 system review of symptoms, the patient complains only of the following symptoms, and all other reviewed systems are  negative.  ALLERGIES: Allergies  Allergen Reactions  . Naproxen Hives    HOME MEDICATIONS: Outpatient Medications Prior to Visit  Medication Sig Dispense Refill  . acetaminophen (TYLENOL) 325 MG tablet Take 2 tablets (650 mg total) by mouth every 6 (six) hours as needed for mild pain (or Fever >/= 101).    [name] aspirin EC 81 MG tablet Take 81 mg daily by mouth.    . Cholecalciferol (VITAMIN D PO) Take 1,000 Units by mouth daily.     . diclofenac sodium (VOLTAREN) 1 % GEL Apply 2 g topically 2 (two) times daily.    [name] docusate sodium (COLACE) 100 MG capsule Take 100 mg by mouth daily as needed for mild constipation.    [name] donepezil (ARICEPT) 5 MG tablet Take 1 tablet (5 mg total) at bedtime by mouth. 30 tablet 11  . DULoxetine (CYMBALTA) 30 MG capsule Take 30 mg by mouth daily.     . folic acid (FOLVITE) 1 MG tablet Take 1 mg by mouth daily.    [name] levothyroxine (SYNTHROID, LEVOTHROID) 88 MCG tablet Take 88 mcg by mouth every other day.     . Melatonin 3 MG CAPS Take 3 mg by mouth at bedtime.    . methotrexate 2.5 MG tablet 2.5 mg once a week.    . metoprolol tartrate (LOPRESSOR) 25 MG tablet 12.5 mg daily.    . vitamin B-12 (CYANOCOBALAMIN) 500 MCG tablet  Take 500 mcg by mouth daily.    [name] amLODipine (NORVASC) 2.5 MG tablet Take 1 tablet (2.5 mg total) by mouth daily. (Patient not taking: Reported on 09/11/2017) 30 tablet 0  . furosemide (LASIX) 20 MG tablet Take 1 tablet (20 mg total) by mouth daily. (Patient not taking: Reported on 04/12/2018) 10 tablet 0  . LORazepam (ATIVAN) 0.5 MG tablet 0.5 mg. Daily at 2 pm    . potassium chloride (K-DUR) 10 MEQ tablet Take 1 tablet (10 mEq total) by mouth daily. (Patient not taking: Reported on 10/18/2017) 10 tablet 0  . traMADol (ULTRAM) 50 MG tablet Take 1 tablet (50 mg total) by mouth every 6 (six) hours as needed for severe pain. (Patient not taking: Reported on 04/12/2018) 60 tablet 0   No facility-administered medications prior to visit.     PAST  MEDICAL HISTORY: Past Medical History:  Diagnosis Date  . Alzheimer disease 05/16/2017  . Anemia   . Breast CA ([place])   . Cancer (HCC)    breast  . High cholesterol   . HTN (hypertension) 08/10/2017  . Iron deficiency   . Memory loss   . Meniere disease   . Multiple falls   . Myalgia   . OA (osteoarthritis) of knee   . Peripheral edema   . RA (rheumatoid arthritis) (HCC)    Dr. Trudie Reed  . Thyroid disease    hypothyroid  . Vitamin B 12 deficiency     PAST SURGICAL HISTORY: Past Surgical History:  Procedure Laterality Date  . BREAST LUMPECTOMY    . CATARACT EXTRACTION      FAMILY HISTORY: Family History  Problem Relation Age of Onset  . Arthritis Mother   . Diabetes Mother   . Cirrhosis Sister   . Cirrhosis Brother     SOCIAL HISTORY: Social History   Socioeconomic History  . Marital status: Married    Spouse name: Not on file  . Number of children: 3  . Years of education: 9  . Highest education level: Not on file  Occupational History  . Not on file  Social Needs  . Financial resource strain: Not on file  . Food insecurity:    Worry: Not on file    Inability: Not on file  . Transportation needs:    Medical: Not on file    Non-medical: Not on file  Tobacco Use  . Smoking status: Never Smoker  . Smokeless tobacco: Never Used  Substance and Sexual Activity  . Alcohol use: No  . Drug use: No  . Sexual activity: Not on file  Lifestyle  . Physical activity:    Days per week: Not on file    Minutes per session: Not on file  . Stress: Not on file  Relationships  . Social connections:    Talks on phone: Not on file    Gets together: Not on file    Attends religious service: Not on file    Active member of club or organization: Not on file    Attends meetings of clubs or organizations: Not on file    Relationship status: Not on file  . Intimate partner violence:    Fear of current or ex partner: Not on file    Emotionally abused: Not on file     Physically abused: Not on file    Forced sexual activity: Not on file  Other Topics Concern  . Not on file  Social History Narrative   Lives alone   Caffeine  use: Drinks coffee (daily)/hot chocolate   Left handed      PHYSICAL EXAM  Vitals:   04/12/18 1111  BP: (!) 144/76  Pulse: 72  Weight: 121 lb (54.9 kg)  Height: 5\' 6"  (1.676 m)   Body mass index is 19.53 kg/m.   MMSE - Mini Mental State Exam 04/12/2018 10/18/2017 05/16/2017  Orientation to time 1 0 2  Orientation to Place 1 3 3   Registration 3 3 3   Attention/ Calculation 0 0 2  Recall 0 0 0  Language- name 2 objects 2 2 2   Language- repeat 1 1 1   Language- follow 3 step command 3 1 3   Language- follow 3 step command-comments - folded twice, handed to RN -  Language- read & follow direction 1 1 1   Write a sentence 0 1 1  Copy design 0 0 0  Total score 12 12 18     Generalized: Well developed, in no acute distress   Neurological examination  Mentation: Alert. Follows all commands speech and language fluent Cranial nerve II-XII: Pupils were equal round reactive to light. Extraocular movements were full, visual field were full on confrontational test. Facial sensation and strength were normal. Uvula tongue midline. Head turning and shoulder shrug  were normal and symmetric. Motor: The motor testing reveals 5 over 5 strength of all 4 extremities. Good symmetric motor tone is noted throughout.  Sensory: Sensory testing is intact to soft touch on all 4 extremities. No evidence of extinction is noted.  Coordination: Cerebellar testing reveals good finger-nose-finger and heel-to-shin bilaterally.  Gait and station: Gait is normal. Tandem gait not attempted Reflexes: Deep tendon reflexes are symmetric and normal bilaterally.   DIAGNOSTIC DATA (LABS, IMAGING, TESTING) - I reviewed patient records, labs, notes, testing and imaging myself where available.  Lab Results  Component Value Date   WBC 9.7 01/20/2018   HGB 11.5  (L) 01/20/2018   HCT 34.8 (L) 01/20/2018   MCV 98.6 01/20/2018   PLT 395 01/20/2018      Component Value Date/Time   NA 133 (L) 01/20/2018 0925   K 3.6 01/20/2018 0925   CL 96 (L) 01/20/2018 0925   CO2 23 01/20/2018 0925   GLUCOSE 109 (H) 01/20/2018 0925   BUN 14 01/20/2018 0925   CREATININE 0.79 01/20/2018 0925   CALCIUM 9.5 01/20/2018 0925   PROT 5.9 (L) 08/03/2017 0356   ALBUMIN 3.0 (L) 08/03/2017 0356   AST 25 08/03/2017 0356   ALT 19 08/03/2017 0356   ALKPHOS 51 08/03/2017 0356   BILITOT 0.7 08/03/2017 0356   GFRNONAA >60 01/20/2018 0925   GFRAA >60 01/20/2018 0925     ASSESSMENT AND PLAN 82 y.o. year old female  has a past medical history of Alzheimer disease (05/16/2017), Anemia, Breast CA ([place]), Cancer ([place]), High cholesterol, HTN (hypertension) (08/10/2017), Iron deficiency, Memory loss, Meniere disease, Multiple falls, Myalgia, OA (osteoarthritis) of knee, Peripheral edema, RA (rheumatoid arthritis) ([place]), Thyroid disease, and Vitamin B 12 deficiency. here with:  1.  Memory disturbance  The patient's memory score has remained stable.  She will continue on Aricept 10 mg daily.  We discussed starting Namenda however the patient and her son deferred at this time.  I did advise the patient's son to ask about scheduled dose of Ativan.  This may can be given as needed unless the patient has issues with agitation during the day.  Patient is advised that if her symptoms worsen or she develops new symptoms they should let us know.  She will follow-up in 6 months or sooner if needed.   I spent 15 minutes with the patient. 50% of this time was spent discussing memory score and medication.   Ward Givens, MSN, NP-C 04/12/2018, 11:31 AM [facility] [address], [place], [place] [address] [phone]

## 2018-04-12 NOTE — Patient Instructions (Signed)
Your Plan:  Continue Aricept  If your symptoms worsen or you develop new symptoms please let us know.    Thank you for coming to see us at [facility]. I hope we have been able to provide you high quality care today.  You may receive a patient satisfaction survey over the next few weeks. We would appreciate your feedback and comments so that we may continue to improve ourselves and the health of our patients.  

## 2018-04-18 ENCOUNTER — Ambulatory Visit: Payer: Medicare Other | Admitting: Neurology

## 2018-04-18 DIAGNOSIS — N644 Mastodynia: Secondary | ICD-10-CM | POA: Diagnosis not present

## 2018-04-18 DIAGNOSIS — R928 Other abnormal and inconclusive findings on diagnostic imaging of breast: Secondary | ICD-10-CM | POA: Diagnosis not present

## 2018-04-18 DIAGNOSIS — F331 Major depressive disorder, recurrent, moderate: Secondary | ICD-10-CM | POA: Diagnosis not present

## 2018-04-18 DIAGNOSIS — R413 Other amnesia: Secondary | ICD-10-CM | POA: Diagnosis not present

## 2018-05-02 DIAGNOSIS — M25562 Pain in left knee: Secondary | ICD-10-CM | POA: Diagnosis not present

## 2018-05-02 DIAGNOSIS — F331 Major depressive disorder, recurrent, moderate: Secondary | ICD-10-CM | POA: Diagnosis not present

## 2018-05-02 DIAGNOSIS — R413 Other amnesia: Secondary | ICD-10-CM | POA: Diagnosis not present

## 2018-05-02 DIAGNOSIS — G301 Alzheimer's disease with late onset: Secondary | ICD-10-CM | POA: Diagnosis not present

## 2018-05-02 DIAGNOSIS — F0281 Dementia in other diseases classified elsewhere with behavioral disturbance: Secondary | ICD-10-CM | POA: Diagnosis not present

## 2018-05-09 DIAGNOSIS — F331 Major depressive disorder, recurrent, moderate: Secondary | ICD-10-CM | POA: Diagnosis not present

## 2018-05-09 DIAGNOSIS — R413 Other amnesia: Secondary | ICD-10-CM | POA: Diagnosis not present

## 2018-05-16 DIAGNOSIS — G8929 Other chronic pain: Secondary | ICD-10-CM | POA: Diagnosis not present

## 2018-05-16 DIAGNOSIS — R079 Chest pain, unspecified: Secondary | ICD-10-CM | POA: Diagnosis not present

## 2018-05-16 DIAGNOSIS — K5900 Constipation, unspecified: Secondary | ICD-10-CM | POA: Diagnosis not present

## 2018-05-16 DIAGNOSIS — I4891 Unspecified atrial fibrillation: Secondary | ICD-10-CM | POA: Diagnosis not present

## 2018-05-16 DIAGNOSIS — F331 Major depressive disorder, recurrent, moderate: Secondary | ICD-10-CM | POA: Diagnosis not present

## 2018-05-16 DIAGNOSIS — J111 Influenza due to unidentified influenza virus with other respiratory manifestations: Secondary | ICD-10-CM | POA: Diagnosis not present

## 2018-05-16 DIAGNOSIS — R600 Localized edema: Secondary | ICD-10-CM | POA: Diagnosis not present

## 2018-05-16 DIAGNOSIS — M25569 Pain in unspecified knee: Secondary | ICD-10-CM | POA: Diagnosis not present

## 2018-05-16 DIAGNOSIS — N6310 Unspecified lump in the right breast, unspecified quadrant: Secondary | ICD-10-CM | POA: Diagnosis not present

## 2018-05-16 DIAGNOSIS — E785 Hyperlipidemia, unspecified: Secondary | ICD-10-CM | POA: Diagnosis not present

## 2018-05-16 DIAGNOSIS — R413 Other amnesia: Secondary | ICD-10-CM | POA: Diagnosis not present

## 2018-05-16 DIAGNOSIS — E039 Hypothyroidism, unspecified: Secondary | ICD-10-CM | POA: Diagnosis not present

## 2018-05-16 DIAGNOSIS — I10 Essential (primary) hypertension: Secondary | ICD-10-CM | POA: Diagnosis not present

## 2018-05-16 DIAGNOSIS — M05611 Rheumatoid arthritis of right shoulder with involvement of other organs and systems: Secondary | ICD-10-CM | POA: Diagnosis not present

## 2018-05-26 DIAGNOSIS — Z79899 Other long term (current) drug therapy: Secondary | ICD-10-CM | POA: Diagnosis not present

## 2018-05-30 DIAGNOSIS — G301 Alzheimer's disease with late onset: Secondary | ICD-10-CM | POA: Diagnosis not present

## 2018-05-30 DIAGNOSIS — F0281 Dementia in other diseases classified elsewhere with behavioral disturbance: Secondary | ICD-10-CM | POA: Diagnosis not present

## 2018-05-30 DIAGNOSIS — F331 Major depressive disorder, recurrent, moderate: Secondary | ICD-10-CM | POA: Diagnosis not present

## 2018-06-06 DIAGNOSIS — F331 Major depressive disorder, recurrent, moderate: Secondary | ICD-10-CM | POA: Diagnosis not present

## 2018-06-06 DIAGNOSIS — R413 Other amnesia: Secondary | ICD-10-CM | POA: Diagnosis not present

## 2018-06-07 DIAGNOSIS — Z961 Presence of intraocular lens: Secondary | ICD-10-CM | POA: Diagnosis not present

## 2018-06-07 DIAGNOSIS — H338 Other retinal detachments: Secondary | ICD-10-CM | POA: Diagnosis not present

## 2018-06-20 DIAGNOSIS — N6310 Unspecified lump in the right breast, unspecified quadrant: Secondary | ICD-10-CM | POA: Diagnosis not present

## 2018-06-20 DIAGNOSIS — I10 Essential (primary) hypertension: Secondary | ICD-10-CM | POA: Diagnosis not present

## 2018-06-20 DIAGNOSIS — E785 Hyperlipidemia, unspecified: Secondary | ICD-10-CM | POA: Diagnosis not present

## 2018-06-20 DIAGNOSIS — I4891 Unspecified atrial fibrillation: Secondary | ICD-10-CM | POA: Diagnosis not present

## 2018-06-20 DIAGNOSIS — M25569 Pain in unspecified knee: Secondary | ICD-10-CM | POA: Diagnosis not present

## 2018-06-20 DIAGNOSIS — R600 Localized edema: Secondary | ICD-10-CM | POA: Diagnosis not present

## 2018-06-20 DIAGNOSIS — E039 Hypothyroidism, unspecified: Secondary | ICD-10-CM | POA: Diagnosis not present

## 2018-06-20 DIAGNOSIS — J111 Influenza due to unidentified influenza virus with other respiratory manifestations: Secondary | ICD-10-CM | POA: Diagnosis not present

## 2018-06-20 DIAGNOSIS — R079 Chest pain, unspecified: Secondary | ICD-10-CM | POA: Diagnosis not present

## 2018-06-20 DIAGNOSIS — K5900 Constipation, unspecified: Secondary | ICD-10-CM | POA: Diagnosis not present

## 2018-06-20 DIAGNOSIS — M05611 Rheumatoid arthritis of right shoulder with involvement of other organs and systems: Secondary | ICD-10-CM | POA: Diagnosis not present

## 2018-06-20 DIAGNOSIS — G8929 Other chronic pain: Secondary | ICD-10-CM | POA: Diagnosis not present

## 2018-06-27 DIAGNOSIS — G301 Alzheimer's disease with late onset: Secondary | ICD-10-CM | POA: Diagnosis not present

## 2018-06-27 DIAGNOSIS — F331 Major depressive disorder, recurrent, moderate: Secondary | ICD-10-CM | POA: Diagnosis not present

## 2018-06-27 DIAGNOSIS — R413 Other amnesia: Secondary | ICD-10-CM | POA: Diagnosis not present

## 2018-06-27 DIAGNOSIS — F0281 Dementia in other diseases classified elsewhere with behavioral disturbance: Secondary | ICD-10-CM | POA: Diagnosis not present

## 2018-07-13 ENCOUNTER — Emergency Department (HOSPITAL_COMMUNITY)
Admission: EM | Admit: 2018-07-13 | Discharge: 2018-07-13 | Disposition: A | Discharge status: Home / Self Care | Payer: Medicare Other | Attending: Emergency Medicine | Admitting: Emergency Medicine

## 2018-07-13 ENCOUNTER — Other Ambulatory Visit: Payer: Self-pay

## 2018-07-13 ENCOUNTER — Encounter (HOSPITAL_COMMUNITY): Payer: Self-pay

## 2018-07-13 DIAGNOSIS — M069 Rheumatoid arthritis, unspecified: Secondary | ICD-10-CM | POA: Diagnosis not present

## 2018-07-13 DIAGNOSIS — Z853 Personal history of malignant neoplasm of breast: Secondary | ICD-10-CM | POA: Diagnosis not present

## 2018-07-13 DIAGNOSIS — E039 Hypothyroidism, unspecified: Secondary | ICD-10-CM | POA: Diagnosis not present

## 2018-07-13 DIAGNOSIS — R251 Tremor, unspecified: Secondary | ICD-10-CM | POA: Diagnosis present

## 2018-07-13 DIAGNOSIS — I10 Essential (primary) hypertension: Secondary | ICD-10-CM | POA: Insufficient documentation

## 2018-07-13 DIAGNOSIS — R450 Nervousness: Secondary | ICD-10-CM | POA: Diagnosis not present

## 2018-07-13 DIAGNOSIS — F0390 Unspecified dementia without behavioral disturbance: Secondary | ICD-10-CM | POA: Insufficient documentation

## 2018-07-13 DIAGNOSIS — R404 Transient alteration of awareness: Secondary | ICD-10-CM | POA: Diagnosis not present

## 2018-07-13 DIAGNOSIS — N390 Urinary tract infection, site not specified: Secondary | ICD-10-CM | POA: Diagnosis not present

## 2018-07-13 DIAGNOSIS — Z79899 Other long term (current) drug therapy: Secondary | ICD-10-CM | POA: Insufficient documentation

## 2018-07-13 DIAGNOSIS — R9431 Abnormal electrocardiogram [ECG] [EKG]: Secondary | ICD-10-CM | POA: Diagnosis not present

## 2018-07-13 LAB — CBC WITH DIFFERENTIAL/PLATELET
Basophils Absolute: 0 10*3/uL (ref 0.0–0.1)
Basophils Relative: 0 %
EOS PCT: 1 %
Eosinophils Absolute: 0.1 10*3/uL (ref 0.0–0.7)
HEMATOCRIT: 34.4 % — AB (ref 36.0–46.0)
HEMOGLOBIN: 11 g/dL — AB (ref 12.0–15.0)
LYMPHS ABS: 1.4 10*3/uL (ref 0.7–4.0)
LYMPHS PCT: 18 %
MCH: 33.8 pg (ref 26.0–34.0)
MCHC: 32 g/dL (ref 30.0–36.0)
MCV: 105.8 fL — AB (ref 78.0–100.0)
Monocytes Absolute: 0.8 10*3/uL (ref 0.1–1.0)
Monocytes Relative: 9 %
NEUTROS ABS: 5.8 10*3/uL (ref 1.7–7.7)
Neutrophils Relative %: 72 %
PLATELETS: 405 10*3/uL — AB (ref 150–400)
RBC: 3.25 MIL/uL — AB (ref 3.87–5.11)
RDW: 15.2 % (ref 11.5–15.5)
WBC: 8.1 10*3/uL (ref 4.0–10.5)

## 2018-07-13 LAB — URINALYSIS, ROUTINE W REFLEX MICROSCOPIC
Bilirubin Urine: NEGATIVE
GLUCOSE, UA: NEGATIVE mg/dL
KETONES UR: NEGATIVE mg/dL
Nitrite: NEGATIVE
PROTEIN: 30 mg/dL — AB
Specific Gravity, Urine: 1.029 (ref 1.005–1.030)
pH: 5 (ref 5.0–8.0)

## 2018-07-13 LAB — BASIC METABOLIC PANEL
ANION GAP: 10 (ref 5–15)
BUN: 22 mg/dL (ref 8–23)
CHLORIDE: 101 mmol/L (ref 98–111)
CO2: 29 mmol/L (ref 22–32)
Calcium: 9.5 mg/dL (ref 8.9–10.3)
Creatinine, Ser: 0.88 mg/dL (ref 0.44–1.00)
GFR calc Af Amer: >60 mL/min (ref >60)
GFR calc non Af Amer: >60 mL/min (ref >60)
GLUCOSE: 131 mg/dL — AB (ref 70–99)
POTASSIUM: 3.8 mmol/L (ref 3.5–5.1)
Sodium: 140 mmol/L (ref 135–145)

## 2018-07-13 MED ORDER — CEPHALEXIN 500 MG PO CAPS
500.0000 mg | ORAL_CAPSULE | Freq: Once | ORAL | Status: AC
  Administered 2018-07-13: 500 mg via ORAL
  Filled 2018-07-13: qty 1

## 2018-07-13 MED ORDER — CEPHALEXIN 500 MG PO CAPS: 500.0000 mg | ORAL_CAPSULE | Freq: Three times a day (TID) | ORAL | 0 refills | Status: DC

## 2018-07-13 NOTE — ED Triage Notes (Addendum)
Per EMS pt from [facility]. A&O x2. Hx of memory loss and nervousness. Pt became very shaky while walking and started having a "panic attack". EMS states that son wants her to be evaluated here at the hospital. EMS reports that the pt did not fall but was lowered to the ground when becoming nervous. Pt complains of back pain. Hx of arthritis.   BP 129/70 HR 70 CBG 112

## 2018-07-13 NOTE — Discharge Instructions (Addendum)
You have a urinary tract infection.  Increase fluids.  Prescription for antibiotic.  Follow-up with your primary care doctor. °

## 2018-07-13 NOTE — ED Provider Notes (Signed)
[place] DEPT Provider Note   CSN: [id number] Arrival date & time: 07/13/18  1733     History   Chief Complaint Chief Complaint  Patient presents with  . Anxiety    HPI Charlene Duncan is a 82 y.o. female.  Level 5 caveat for dementia.  Patient resides at [facility].  Patient became very "shaky" while walking today.  She did not actually pass out.  Son reports normal behavior.  No chest pain, dyspnea, fever, sweats, chills, dysuria, gross neurological deficits.     Past Medical History:  Diagnosis Date  . Alzheimer disease 05/16/2017  . Anemia   . Breast CA ([place])   . Cancer (HCC)    breast  . High cholesterol   . HTN (hypertension) 08/10/2017  . Iron deficiency   . Memory loss   . Meniere disease   . Multiple falls   . Myalgia   . OA (osteoarthritis) of knee   . Peripheral edema   . RA (rheumatoid arthritis) (HCC)    Dr. Trudie Reed  . Thyroid disease    hypothyroid  . Vitamin B 12 deficiency     Patient Active Problem List   Diagnosis Date Noted  . HTN (hypertension) 08/10/2017  . Lumbar compression fracture ([place]) 08/02/2017  . Closed fracture of multiple pubic rami, right, sequela   . Anemia 08/01/2017  . Hyperglycemia 08/01/2017  . Compression fracture of first lumbar vertebra ([place]) 07/31/2017  . Alzheimer disease 05/16/2017  . Arthritis 06/16/2014  . Congenital hypothyroidism without goiter 06/16/2014  . History of breast cancer 06/16/2014  . Hyperlipidemia 06/16/2014  . Iron deficiency 06/16/2014    Past Surgical History:  Procedure Laterality Date  . BREAST LUMPECTOMY    . CATARACT EXTRACTION       OB History   None      Home Medications    Prior to Admission medications   Medication Sig Start Date End Date Taking? Authorizing Provider  acetaminophen (TYLENOL) 325 MG tablet Take 2 tablets (650 mg total) by mouth every 6 (six) hours as needed for mild pain (or Fever >/= 101). Patient  taking differently: Take 325 mg by mouth every 4 (four) hours as needed for mild pain (or Fever >/= 101).  08/02/17  Yes [name], MD  acetaminophen (TYLENOL) 500 MG tablet Take 500 mg by mouth 3 (three) times daily.   Yes [provider]  aspirin EC 81 MG tablet Take 81 mg daily by mouth.   Yes [provider]  Cholecalciferol (VITAMIN D PO) Take 1,000 Units by mouth daily.    Yes [provider]  diclofenac sodium (VOLTAREN) 1 % GEL Apply 2 g topically 2 (two) times daily.   Yes [provider]  donepezil (ARICEPT) 5 MG tablet Take 1 tablet (5 mg total) at bedtime by mouth. Patient taking differently: Take 10 mg by mouth at bedtime.  10/18/17  Yes Ward Givens, NP  DULoxetine (CYMBALTA) 60 MG capsule Take 60 mg by mouth daily.  06/04/17  Yes [provider]  folic acid (FOLVITE) 1 MG tablet Take 1 mg by mouth daily.   Yes [provider]  levothyroxine (SYNTHROID, LEVOTHROID) 88 MCG tablet Take 88 mcg by mouth every other day.  03/21/17  Yes [provider]  LORazepam (ATIVAN) 0.5 MG tablet Take 0.5 mg by mouth See admin instructions. Daily at 2 pm 04/11/18  Yes [provider]  Melatonin 3 MG CAPS Take 3 mg by mouth  at bedtime.   Yes [provider]  methotrexate 2.5 MG tablet Take 15 mg by mouth once a week.  04/11/18  Yes [provider]  metoprolol tartrate (LOPRESSOR) 25 MG tablet 12.5 mg daily. 10/04/17  Yes [provider]  vitamin B-12 (CYANOCOBALAMIN) 500 MCG tablet Take 500 mcg by mouth daily.   Yes [provider]  amLODipine (NORVASC) 2.5 MG tablet Take 1 tablet (2.5 mg total) by mouth daily. Patient not taking: Reported on 09/11/2017 08/11/17   Love, Ivan Anchors, PA-C  cephALEXin (KEFLEX) 500 MG capsule Take 1 capsule (500 mg total) by mouth 3 (three) times daily. 07/13/18   Nat Christen, MD  furosemide (LASIX) 20 MG tablet Take 1 tablet (20 mg total) by mouth daily. Patient not  taking: Reported on 04/12/2018 09/11/17   Daleen Bo, MD  potassium chloride (K-DUR) 10 MEQ tablet Take 1 tablet (10 mEq total) by mouth daily. Patient not taking: Reported on 10/18/2017 09/11/17   Daleen Bo, MD  traMADol (ULTRAM) 50 MG tablet Take 1 tablet (50 mg total) by mouth every 6 (six) hours as needed for severe pain. Patient not taking: Reported on 04/12/2018 08/10/17   Love, Ivan Anchors, PA-C    Family History Family History  Problem Relation Age of Onset  . Arthritis Mother   . Diabetes Mother   . Cirrhosis Sister   . Cirrhosis Brother     Social History Social History   Tobacco Use  . Smoking status: Never Smoker  . Smokeless tobacco: Never Used  Substance Use Topics  . Alcohol use: No  . Drug use: No     Allergies   Naproxen   Review of Systems Review of Systems  Unable to perform ROS: Dementia     Physical Exam Updated Vital Signs BP (!) 150/83 (BP Location: Left Arm)   Pulse 77   Temp 98.4 F (36.9 C) (Oral)   Resp 16   Ht 5\' 7"  (1.702 m)   Wt 60 kg   SpO2 99%   BMI 20.72 kg/m   Physical Exam  Constitutional: She appears well-developed and well-nourished.  Pleasant, no acute distress  HENT:  Head: Normocephalic and atraumatic.  Eyes: Conjunctivae are normal.  Neck: Neck supple.  Cardiovascular: Normal rate and regular rhythm.  Pulmonary/Chest: Effort normal and breath sounds normal.  Abdominal: Soft. Bowel sounds are normal.  Genitourinary:  Genitourinary Comments: No flank tenderness.  Musculoskeletal: Normal range of motion.  Neurological: She is alert.  Skin: Skin is warm and dry.  Psychiatric:  Demented, conversant  Nursing note and vitals reviewed.    ED Treatments / Results  Labs (all labs ordered are listed, but only abnormal results are displayed) Labs Reviewed  CBC WITH DIFFERENTIAL/PLATELET - Abnormal; Notable for the following components:      Result Value   RBC 3.25 (*)    Hemoglobin 11.0 (*)    HCT 34.4 (*)     MCV 105.8 (*)    Platelets 405 (*)    All other components within normal limits  BASIC METABOLIC PANEL - Abnormal; Notable for the following components:   Glucose, Bld 131 (*)    All other components within normal limits  URINALYSIS, ROUTINE W REFLEX MICROSCOPIC - Abnormal; Notable for the following components:   Color, Urine AMBER (*)    APPearance CLOUDY (*)    Hgb urine dipstick SMALL (*)    Protein, ur 30 (*)    Leukocytes, UA SMALL (*)    Bacteria, UA  FEW (*)    Non Squamous Epithelial 0-5 (*)    All other components within normal limits  URINE CULTURE    EKG EKG Interpretation  Date/Time:  Thursday July 13 2018 18:26:52 EDT Ventricular Rate:  71 PR Interval:  158 QRS Duration: 100 QT Interval:  428 QTC Calculation: 465 R Axis:   76 Text Interpretation:  Normal sinus rhythm Septal infarct , age undetermined Abnormal ECG Confirmed by Nat Christen [phone]) on 07/13/2018 7:46:43 PM   Radiology No results found.  Procedures Procedures (including critical care time)  Medications Ordered in ED Medications  cephALEXin (KEFLEX) capsule 500 mg (500 mg Oral Given 07/13/18 2005)     Initial Impression / Assessment and Plan / ED Course  I have reviewed the triage vital signs and the nursing notes.  Pertinent labs & imaging results that were available during my care of the patient were reviewed by me and considered in my medical decision making (see chart for details).     Son reports normal behavior.  EKG normal sinus rhythm.  Hemoglobin 11.  Urinalysis shows evidence of infection.  I suspect this is the cause of her concerns today.  Will Rx Keflex 500 mg.  Urine culture.  Discussed with the son.  He agrees with the treatment plan.  Final Clinical Impressions(s) / ED Diagnoses   Final diagnoses:  Urinary tract infection without hematuria, site unspecified    ED Discharge Orders         Ordered    cephALEXin (KEFLEX) 500 MG capsule  3 times daily     07/13/18  2036           Nat Christen, MD 07/13/18 2055

## 2018-07-13 NOTE — ED Notes (Signed)
Bed: [facility] Expected date:  Expected time:  Means of arrival:  Comments: anxiety

## 2018-07-15 LAB — URINE CULTURE

## 2018-07-18 DIAGNOSIS — E785 Hyperlipidemia, unspecified: Secondary | ICD-10-CM | POA: Diagnosis not present

## 2018-07-18 DIAGNOSIS — M05611 Rheumatoid arthritis of right shoulder with involvement of other organs and systems: Secondary | ICD-10-CM | POA: Diagnosis not present

## 2018-07-18 DIAGNOSIS — G8929 Other chronic pain: Secondary | ICD-10-CM | POA: Diagnosis not present

## 2018-07-18 DIAGNOSIS — N6310 Unspecified lump in the right breast, unspecified quadrant: Secondary | ICD-10-CM | POA: Diagnosis not present

## 2018-07-18 DIAGNOSIS — E039 Hypothyroidism, unspecified: Secondary | ICD-10-CM | POA: Diagnosis not present

## 2018-07-18 DIAGNOSIS — R600 Localized edema: Secondary | ICD-10-CM | POA: Diagnosis not present

## 2018-07-18 DIAGNOSIS — J111 Influenza due to unidentified influenza virus with other respiratory manifestations: Secondary | ICD-10-CM | POA: Diagnosis not present

## 2018-07-18 DIAGNOSIS — K5900 Constipation, unspecified: Secondary | ICD-10-CM | POA: Diagnosis not present

## 2018-07-18 DIAGNOSIS — I4891 Unspecified atrial fibrillation: Secondary | ICD-10-CM | POA: Diagnosis not present

## 2018-07-18 DIAGNOSIS — N390 Urinary tract infection, site not specified: Secondary | ICD-10-CM | POA: Diagnosis not present

## 2018-07-18 DIAGNOSIS — I10 Essential (primary) hypertension: Secondary | ICD-10-CM | POA: Diagnosis not present

## 2018-07-18 DIAGNOSIS — R079 Chest pain, unspecified: Secondary | ICD-10-CM | POA: Diagnosis not present

## 2018-07-20 DIAGNOSIS — Z79899 Other long term (current) drug therapy: Secondary | ICD-10-CM | POA: Diagnosis not present

## 2018-07-20 DIAGNOSIS — M0609 Rheumatoid arthritis without rheumatoid factor, multiple sites: Secondary | ICD-10-CM | POA: Diagnosis not present

## 2018-07-20 DIAGNOSIS — R636 Underweight: Secondary | ICD-10-CM | POA: Diagnosis not present

## 2018-07-20 DIAGNOSIS — Z681 Body mass index (BMI) 19 or less, adult: Secondary | ICD-10-CM | POA: Diagnosis not present

## 2018-07-20 DIAGNOSIS — M2550 Pain in unspecified joint: Secondary | ICD-10-CM | POA: Diagnosis not present

## 2018-07-20 DIAGNOSIS — R768 Other specified abnormal immunological findings in serum: Secondary | ICD-10-CM | POA: Diagnosis not present

## 2018-07-25 DIAGNOSIS — G301 Alzheimer's disease with late onset: Secondary | ICD-10-CM | POA: Diagnosis not present

## 2018-07-25 DIAGNOSIS — F0281 Dementia in other diseases classified elsewhere with behavioral disturbance: Secondary | ICD-10-CM | POA: Diagnosis not present

## 2018-07-25 DIAGNOSIS — F331 Major depressive disorder, recurrent, moderate: Secondary | ICD-10-CM | POA: Diagnosis not present

## 2018-08-01 DIAGNOSIS — G301 Alzheimer's disease with late onset: Secondary | ICD-10-CM | POA: Diagnosis not present

## 2018-08-01 DIAGNOSIS — F0281 Dementia in other diseases classified elsewhere with behavioral disturbance: Secondary | ICD-10-CM | POA: Diagnosis not present

## 2018-08-01 DIAGNOSIS — F331 Major depressive disorder, recurrent, moderate: Secondary | ICD-10-CM | POA: Diagnosis not present

## 2018-08-15 DIAGNOSIS — F331 Major depressive disorder, recurrent, moderate: Secondary | ICD-10-CM | POA: Diagnosis not present

## 2018-08-15 DIAGNOSIS — R41841 Cognitive communication deficit: Secondary | ICD-10-CM | POA: Diagnosis not present

## 2018-08-15 DIAGNOSIS — R278 Other lack of coordination: Secondary | ICD-10-CM | POA: Diagnosis not present

## 2018-08-15 DIAGNOSIS — M6281 Muscle weakness (generalized): Secondary | ICD-10-CM | POA: Diagnosis not present

## 2018-08-15 DIAGNOSIS — G301 Alzheimer's disease with late onset: Secondary | ICD-10-CM | POA: Diagnosis not present

## 2018-08-15 DIAGNOSIS — R413 Other amnesia: Secondary | ICD-10-CM | POA: Diagnosis not present

## 2018-08-15 DIAGNOSIS — G309 Alzheimer's disease, unspecified: Secondary | ICD-10-CM | POA: Diagnosis not present

## 2018-08-15 DIAGNOSIS — F0281 Dementia in other diseases classified elsewhere with behavioral disturbance: Secondary | ICD-10-CM | POA: Diagnosis not present

## 2018-08-16 DIAGNOSIS — M6281 Muscle weakness (generalized): Secondary | ICD-10-CM | POA: Diagnosis not present

## 2018-08-16 DIAGNOSIS — R413 Other amnesia: Secondary | ICD-10-CM | POA: Diagnosis not present

## 2018-08-16 DIAGNOSIS — R278 Other lack of coordination: Secondary | ICD-10-CM | POA: Diagnosis not present

## 2018-08-16 DIAGNOSIS — G309 Alzheimer's disease, unspecified: Secondary | ICD-10-CM | POA: Diagnosis not present

## 2018-08-16 DIAGNOSIS — R41841 Cognitive communication deficit: Secondary | ICD-10-CM | POA: Diagnosis not present

## 2018-08-17 DIAGNOSIS — R278 Other lack of coordination: Secondary | ICD-10-CM | POA: Diagnosis not present

## 2018-08-17 DIAGNOSIS — M6281 Muscle weakness (generalized): Secondary | ICD-10-CM | POA: Diagnosis not present

## 2018-08-17 DIAGNOSIS — R41841 Cognitive communication deficit: Secondary | ICD-10-CM | POA: Diagnosis not present

## 2018-08-17 DIAGNOSIS — G309 Alzheimer's disease, unspecified: Secondary | ICD-10-CM | POA: Diagnosis not present

## 2018-08-17 DIAGNOSIS — R413 Other amnesia: Secondary | ICD-10-CM | POA: Diagnosis not present

## 2018-08-18 DIAGNOSIS — G309 Alzheimer's disease, unspecified: Secondary | ICD-10-CM | POA: Diagnosis not present

## 2018-08-18 DIAGNOSIS — R413 Other amnesia: Secondary | ICD-10-CM | POA: Diagnosis not present

## 2018-08-18 DIAGNOSIS — R278 Other lack of coordination: Secondary | ICD-10-CM | POA: Diagnosis not present

## 2018-08-18 DIAGNOSIS — M6281 Muscle weakness (generalized): Secondary | ICD-10-CM | POA: Diagnosis not present

## 2018-08-18 DIAGNOSIS — R41841 Cognitive communication deficit: Secondary | ICD-10-CM | POA: Diagnosis not present

## 2018-08-21 DIAGNOSIS — R41841 Cognitive communication deficit: Secondary | ICD-10-CM | POA: Diagnosis not present

## 2018-08-21 DIAGNOSIS — R413 Other amnesia: Secondary | ICD-10-CM | POA: Diagnosis not present

## 2018-08-21 DIAGNOSIS — G309 Alzheimer's disease, unspecified: Secondary | ICD-10-CM | POA: Diagnosis not present

## 2018-08-21 DIAGNOSIS — R278 Other lack of coordination: Secondary | ICD-10-CM | POA: Diagnosis not present

## 2018-08-21 DIAGNOSIS — M6281 Muscle weakness (generalized): Secondary | ICD-10-CM | POA: Diagnosis not present

## 2018-08-22 DIAGNOSIS — G8929 Other chronic pain: Secondary | ICD-10-CM | POA: Diagnosis not present

## 2018-08-22 DIAGNOSIS — N390 Urinary tract infection, site not specified: Secondary | ICD-10-CM | POA: Diagnosis not present

## 2018-08-22 DIAGNOSIS — R600 Localized edema: Secondary | ICD-10-CM | POA: Diagnosis not present

## 2018-08-22 DIAGNOSIS — N6310 Unspecified lump in the right breast, unspecified quadrant: Secondary | ICD-10-CM | POA: Diagnosis not present

## 2018-08-22 DIAGNOSIS — I10 Essential (primary) hypertension: Secondary | ICD-10-CM | POA: Diagnosis not present

## 2018-08-22 DIAGNOSIS — R079 Chest pain, unspecified: Secondary | ICD-10-CM | POA: Diagnosis not present

## 2018-08-22 DIAGNOSIS — I4891 Unspecified atrial fibrillation: Secondary | ICD-10-CM | POA: Diagnosis not present

## 2018-08-22 DIAGNOSIS — M05611 Rheumatoid arthritis of right shoulder with involvement of other organs and systems: Secondary | ICD-10-CM | POA: Diagnosis not present

## 2018-08-22 DIAGNOSIS — E039 Hypothyroidism, unspecified: Secondary | ICD-10-CM | POA: Diagnosis not present

## 2018-08-22 DIAGNOSIS — R278 Other lack of coordination: Secondary | ICD-10-CM | POA: Diagnosis not present

## 2018-08-22 DIAGNOSIS — J111 Influenza due to unidentified influenza virus with other respiratory manifestations: Secondary | ICD-10-CM | POA: Diagnosis not present

## 2018-08-22 DIAGNOSIS — R41841 Cognitive communication deficit: Secondary | ICD-10-CM | POA: Diagnosis not present

## 2018-08-22 DIAGNOSIS — G309 Alzheimer's disease, unspecified: Secondary | ICD-10-CM | POA: Diagnosis not present

## 2018-08-22 DIAGNOSIS — M6281 Muscle weakness (generalized): Secondary | ICD-10-CM | POA: Diagnosis not present

## 2018-08-22 DIAGNOSIS — K5900 Constipation, unspecified: Secondary | ICD-10-CM | POA: Diagnosis not present

## 2018-08-22 DIAGNOSIS — E785 Hyperlipidemia, unspecified: Secondary | ICD-10-CM | POA: Diagnosis not present

## 2018-08-22 DIAGNOSIS — R413 Other amnesia: Secondary | ICD-10-CM | POA: Diagnosis not present

## 2018-08-23 DIAGNOSIS — M6281 Muscle weakness (generalized): Secondary | ICD-10-CM | POA: Diagnosis not present

## 2018-08-23 DIAGNOSIS — R278 Other lack of coordination: Secondary | ICD-10-CM | POA: Diagnosis not present

## 2018-08-23 DIAGNOSIS — G309 Alzheimer's disease, unspecified: Secondary | ICD-10-CM | POA: Diagnosis not present

## 2018-08-23 DIAGNOSIS — R413 Other amnesia: Secondary | ICD-10-CM | POA: Diagnosis not present

## 2018-08-23 DIAGNOSIS — R41841 Cognitive communication deficit: Secondary | ICD-10-CM | POA: Diagnosis not present

## 2018-08-24 DIAGNOSIS — R41841 Cognitive communication deficit: Secondary | ICD-10-CM | POA: Diagnosis not present

## 2018-08-24 DIAGNOSIS — R278 Other lack of coordination: Secondary | ICD-10-CM | POA: Diagnosis not present

## 2018-08-24 DIAGNOSIS — R413 Other amnesia: Secondary | ICD-10-CM | POA: Diagnosis not present

## 2018-08-24 DIAGNOSIS — G309 Alzheimer's disease, unspecified: Secondary | ICD-10-CM | POA: Diagnosis not present

## 2018-08-24 DIAGNOSIS — M6281 Muscle weakness (generalized): Secondary | ICD-10-CM | POA: Diagnosis not present

## 2018-08-25 DIAGNOSIS — E559 Vitamin D deficiency, unspecified: Secondary | ICD-10-CM | POA: Diagnosis not present

## 2018-08-25 DIAGNOSIS — R278 Other lack of coordination: Secondary | ICD-10-CM | POA: Diagnosis not present

## 2018-08-25 DIAGNOSIS — E039 Hypothyroidism, unspecified: Secondary | ICD-10-CM | POA: Diagnosis not present

## 2018-08-25 DIAGNOSIS — R413 Other amnesia: Secondary | ICD-10-CM | POA: Diagnosis not present

## 2018-08-25 DIAGNOSIS — R41841 Cognitive communication deficit: Secondary | ICD-10-CM | POA: Diagnosis not present

## 2018-08-25 DIAGNOSIS — M6281 Muscle weakness (generalized): Secondary | ICD-10-CM | POA: Diagnosis not present

## 2018-08-25 DIAGNOSIS — I10 Essential (primary) hypertension: Secondary | ICD-10-CM | POA: Diagnosis not present

## 2018-08-25 DIAGNOSIS — G309 Alzheimer's disease, unspecified: Secondary | ICD-10-CM | POA: Diagnosis not present

## 2018-08-25 DIAGNOSIS — E782 Mixed hyperlipidemia: Secondary | ICD-10-CM | POA: Diagnosis not present

## 2018-08-28 DIAGNOSIS — R413 Other amnesia: Secondary | ICD-10-CM | POA: Diagnosis not present

## 2018-08-28 DIAGNOSIS — R41841 Cognitive communication deficit: Secondary | ICD-10-CM | POA: Diagnosis not present

## 2018-08-28 DIAGNOSIS — R278 Other lack of coordination: Secondary | ICD-10-CM | POA: Diagnosis not present

## 2018-08-28 DIAGNOSIS — G309 Alzheimer's disease, unspecified: Secondary | ICD-10-CM | POA: Diagnosis not present

## 2018-08-28 DIAGNOSIS — M6281 Muscle weakness (generalized): Secondary | ICD-10-CM | POA: Diagnosis not present

## 2018-08-29 DIAGNOSIS — G309 Alzheimer's disease, unspecified: Secondary | ICD-10-CM | POA: Diagnosis not present

## 2018-08-29 DIAGNOSIS — M6281 Muscle weakness (generalized): Secondary | ICD-10-CM | POA: Diagnosis not present

## 2018-08-29 DIAGNOSIS — R41841 Cognitive communication deficit: Secondary | ICD-10-CM | POA: Diagnosis not present

## 2018-08-29 DIAGNOSIS — R413 Other amnesia: Secondary | ICD-10-CM | POA: Diagnosis not present

## 2018-08-29 DIAGNOSIS — R278 Other lack of coordination: Secondary | ICD-10-CM | POA: Diagnosis not present

## 2018-08-29 DIAGNOSIS — F331 Major depressive disorder, recurrent, moderate: Secondary | ICD-10-CM | POA: Diagnosis not present

## 2018-08-30 DIAGNOSIS — R413 Other amnesia: Secondary | ICD-10-CM | POA: Diagnosis not present

## 2018-08-30 DIAGNOSIS — G309 Alzheimer's disease, unspecified: Secondary | ICD-10-CM | POA: Diagnosis not present

## 2018-08-30 DIAGNOSIS — M6281 Muscle weakness (generalized): Secondary | ICD-10-CM | POA: Diagnosis not present

## 2018-08-30 DIAGNOSIS — R278 Other lack of coordination: Secondary | ICD-10-CM | POA: Diagnosis not present

## 2018-08-30 DIAGNOSIS — R41841 Cognitive communication deficit: Secondary | ICD-10-CM | POA: Diagnosis not present

## 2018-08-31 DIAGNOSIS — R413 Other amnesia: Secondary | ICD-10-CM | POA: Diagnosis not present

## 2018-08-31 DIAGNOSIS — R278 Other lack of coordination: Secondary | ICD-10-CM | POA: Diagnosis not present

## 2018-08-31 DIAGNOSIS — R41841 Cognitive communication deficit: Secondary | ICD-10-CM | POA: Diagnosis not present

## 2018-08-31 DIAGNOSIS — G309 Alzheimer's disease, unspecified: Secondary | ICD-10-CM | POA: Diagnosis not present

## 2018-08-31 DIAGNOSIS — M6281 Muscle weakness (generalized): Secondary | ICD-10-CM | POA: Diagnosis not present

## 2018-09-01 DIAGNOSIS — R278 Other lack of coordination: Secondary | ICD-10-CM | POA: Diagnosis not present

## 2018-09-01 DIAGNOSIS — G309 Alzheimer's disease, unspecified: Secondary | ICD-10-CM | POA: Diagnosis not present

## 2018-09-01 DIAGNOSIS — M6281 Muscle weakness (generalized): Secondary | ICD-10-CM | POA: Diagnosis not present

## 2018-09-01 DIAGNOSIS — R41841 Cognitive communication deficit: Secondary | ICD-10-CM | POA: Diagnosis not present

## 2018-09-01 DIAGNOSIS — R413 Other amnesia: Secondary | ICD-10-CM | POA: Diagnosis not present

## 2018-09-04 DIAGNOSIS — R278 Other lack of coordination: Secondary | ICD-10-CM | POA: Diagnosis not present

## 2018-09-04 DIAGNOSIS — R413 Other amnesia: Secondary | ICD-10-CM | POA: Diagnosis not present

## 2018-09-04 DIAGNOSIS — R41841 Cognitive communication deficit: Secondary | ICD-10-CM | POA: Diagnosis not present

## 2018-09-04 DIAGNOSIS — M6281 Muscle weakness (generalized): Secondary | ICD-10-CM | POA: Diagnosis not present

## 2018-09-04 DIAGNOSIS — G309 Alzheimer's disease, unspecified: Secondary | ICD-10-CM | POA: Diagnosis not present

## 2018-09-05 DIAGNOSIS — R41841 Cognitive communication deficit: Secondary | ICD-10-CM | POA: Diagnosis not present

## 2018-09-05 DIAGNOSIS — F331 Major depressive disorder, recurrent, moderate: Secondary | ICD-10-CM | POA: Diagnosis not present

## 2018-09-05 DIAGNOSIS — R413 Other amnesia: Secondary | ICD-10-CM | POA: Diagnosis not present

## 2018-09-05 DIAGNOSIS — R278 Other lack of coordination: Secondary | ICD-10-CM | POA: Diagnosis not present

## 2018-09-05 DIAGNOSIS — G309 Alzheimer's disease, unspecified: Secondary | ICD-10-CM | POA: Diagnosis not present

## 2018-09-05 DIAGNOSIS — M6281 Muscle weakness (generalized): Secondary | ICD-10-CM | POA: Diagnosis not present

## 2018-09-06 DIAGNOSIS — R41841 Cognitive communication deficit: Secondary | ICD-10-CM | POA: Diagnosis not present

## 2018-09-06 DIAGNOSIS — M6281 Muscle weakness (generalized): Secondary | ICD-10-CM | POA: Diagnosis not present

## 2018-09-06 DIAGNOSIS — G309 Alzheimer's disease, unspecified: Secondary | ICD-10-CM | POA: Diagnosis not present

## 2018-09-06 DIAGNOSIS — R413 Other amnesia: Secondary | ICD-10-CM | POA: Diagnosis not present

## 2018-09-06 DIAGNOSIS — R278 Other lack of coordination: Secondary | ICD-10-CM | POA: Diagnosis not present

## 2018-09-07 DIAGNOSIS — R278 Other lack of coordination: Secondary | ICD-10-CM | POA: Diagnosis not present

## 2018-09-07 DIAGNOSIS — M6281 Muscle weakness (generalized): Secondary | ICD-10-CM | POA: Diagnosis not present

## 2018-09-07 DIAGNOSIS — G309 Alzheimer's disease, unspecified: Secondary | ICD-10-CM | POA: Diagnosis not present

## 2018-09-07 DIAGNOSIS — R41841 Cognitive communication deficit: Secondary | ICD-10-CM | POA: Diagnosis not present

## 2018-09-07 DIAGNOSIS — R413 Other amnesia: Secondary | ICD-10-CM | POA: Diagnosis not present

## 2018-09-08 DIAGNOSIS — R278 Other lack of coordination: Secondary | ICD-10-CM | POA: Diagnosis not present

## 2018-09-08 DIAGNOSIS — G309 Alzheimer's disease, unspecified: Secondary | ICD-10-CM | POA: Diagnosis not present

## 2018-09-08 DIAGNOSIS — R413 Other amnesia: Secondary | ICD-10-CM | POA: Diagnosis not present

## 2018-09-08 DIAGNOSIS — M6281 Muscle weakness (generalized): Secondary | ICD-10-CM | POA: Diagnosis not present

## 2018-09-08 DIAGNOSIS — R41841 Cognitive communication deficit: Secondary | ICD-10-CM | POA: Diagnosis not present

## 2018-09-11 DIAGNOSIS — G309 Alzheimer's disease, unspecified: Secondary | ICD-10-CM | POA: Diagnosis not present

## 2018-09-11 DIAGNOSIS — R413 Other amnesia: Secondary | ICD-10-CM | POA: Diagnosis not present

## 2018-09-11 DIAGNOSIS — R41841 Cognitive communication deficit: Secondary | ICD-10-CM | POA: Diagnosis not present

## 2018-09-11 DIAGNOSIS — R278 Other lack of coordination: Secondary | ICD-10-CM | POA: Diagnosis not present

## 2018-09-11 DIAGNOSIS — M6281 Muscle weakness (generalized): Secondary | ICD-10-CM | POA: Diagnosis not present

## 2018-09-12 DIAGNOSIS — F331 Major depressive disorder, recurrent, moderate: Secondary | ICD-10-CM | POA: Diagnosis not present

## 2018-09-12 DIAGNOSIS — R413 Other amnesia: Secondary | ICD-10-CM | POA: Diagnosis not present

## 2018-09-12 DIAGNOSIS — R278 Other lack of coordination: Secondary | ICD-10-CM | POA: Diagnosis not present

## 2018-09-12 DIAGNOSIS — G309 Alzheimer's disease, unspecified: Secondary | ICD-10-CM | POA: Diagnosis not present

## 2018-09-12 DIAGNOSIS — G301 Alzheimer's disease with late onset: Secondary | ICD-10-CM | POA: Diagnosis not present

## 2018-09-12 DIAGNOSIS — F0281 Dementia in other diseases classified elsewhere with behavioral disturbance: Secondary | ICD-10-CM | POA: Diagnosis not present

## 2018-09-12 DIAGNOSIS — R41841 Cognitive communication deficit: Secondary | ICD-10-CM | POA: Diagnosis not present

## 2018-09-12 DIAGNOSIS — M6281 Muscle weakness (generalized): Secondary | ICD-10-CM | POA: Diagnosis not present

## 2018-09-13 DIAGNOSIS — R41841 Cognitive communication deficit: Secondary | ICD-10-CM | POA: Diagnosis not present

## 2018-09-13 DIAGNOSIS — R278 Other lack of coordination: Secondary | ICD-10-CM | POA: Diagnosis not present

## 2018-09-13 DIAGNOSIS — G309 Alzheimer's disease, unspecified: Secondary | ICD-10-CM | POA: Diagnosis not present

## 2018-09-13 DIAGNOSIS — M6281 Muscle weakness (generalized): Secondary | ICD-10-CM | POA: Diagnosis not present

## 2018-09-13 DIAGNOSIS — R413 Other amnesia: Secondary | ICD-10-CM | POA: Diagnosis not present

## 2018-09-18 DIAGNOSIS — M6281 Muscle weakness (generalized): Secondary | ICD-10-CM | POA: Diagnosis not present

## 2018-09-18 DIAGNOSIS — R413 Other amnesia: Secondary | ICD-10-CM | POA: Diagnosis not present

## 2018-09-18 DIAGNOSIS — R41841 Cognitive communication deficit: Secondary | ICD-10-CM | POA: Diagnosis not present

## 2018-09-18 DIAGNOSIS — G309 Alzheimer's disease, unspecified: Secondary | ICD-10-CM | POA: Diagnosis not present

## 2018-09-18 DIAGNOSIS — R278 Other lack of coordination: Secondary | ICD-10-CM | POA: Diagnosis not present

## 2018-09-19 DIAGNOSIS — R413 Other amnesia: Secondary | ICD-10-CM | POA: Diagnosis not present

## 2018-09-19 DIAGNOSIS — R600 Localized edema: Secondary | ICD-10-CM | POA: Diagnosis not present

## 2018-09-19 DIAGNOSIS — M05611 Rheumatoid arthritis of right shoulder with involvement of other organs and systems: Secondary | ICD-10-CM | POA: Diagnosis not present

## 2018-09-19 DIAGNOSIS — G309 Alzheimer's disease, unspecified: Secondary | ICD-10-CM | POA: Diagnosis not present

## 2018-09-19 DIAGNOSIS — I4891 Unspecified atrial fibrillation: Secondary | ICD-10-CM | POA: Diagnosis not present

## 2018-09-19 DIAGNOSIS — R278 Other lack of coordination: Secondary | ICD-10-CM | POA: Diagnosis not present

## 2018-09-19 DIAGNOSIS — M6281 Muscle weakness (generalized): Secondary | ICD-10-CM | POA: Diagnosis not present

## 2018-09-19 DIAGNOSIS — G8929 Other chronic pain: Secondary | ICD-10-CM | POA: Diagnosis not present

## 2018-09-19 DIAGNOSIS — R079 Chest pain, unspecified: Secondary | ICD-10-CM | POA: Diagnosis not present

## 2018-09-19 DIAGNOSIS — I10 Essential (primary) hypertension: Secondary | ICD-10-CM | POA: Diagnosis not present

## 2018-09-19 DIAGNOSIS — J111 Influenza due to unidentified influenza virus with other respiratory manifestations: Secondary | ICD-10-CM | POA: Diagnosis not present

## 2018-09-19 DIAGNOSIS — K5900 Constipation, unspecified: Secondary | ICD-10-CM | POA: Diagnosis not present

## 2018-09-19 DIAGNOSIS — R41841 Cognitive communication deficit: Secondary | ICD-10-CM | POA: Diagnosis not present

## 2018-09-19 DIAGNOSIS — N390 Urinary tract infection, site not specified: Secondary | ICD-10-CM | POA: Diagnosis not present

## 2018-09-19 DIAGNOSIS — N6310 Unspecified lump in the right breast, unspecified quadrant: Secondary | ICD-10-CM | POA: Diagnosis not present

## 2018-09-19 DIAGNOSIS — E785 Hyperlipidemia, unspecified: Secondary | ICD-10-CM | POA: Diagnosis not present

## 2018-09-19 DIAGNOSIS — E039 Hypothyroidism, unspecified: Secondary | ICD-10-CM | POA: Diagnosis not present

## 2018-09-22 DIAGNOSIS — R278 Other lack of coordination: Secondary | ICD-10-CM | POA: Diagnosis not present

## 2018-09-22 DIAGNOSIS — M6281 Muscle weakness (generalized): Secondary | ICD-10-CM | POA: Diagnosis not present

## 2018-09-22 DIAGNOSIS — R41841 Cognitive communication deficit: Secondary | ICD-10-CM | POA: Diagnosis not present

## 2018-09-22 DIAGNOSIS — G309 Alzheimer's disease, unspecified: Secondary | ICD-10-CM | POA: Diagnosis not present

## 2018-09-22 DIAGNOSIS — R413 Other amnesia: Secondary | ICD-10-CM | POA: Diagnosis not present

## 2018-09-25 DIAGNOSIS — R413 Other amnesia: Secondary | ICD-10-CM | POA: Diagnosis not present

## 2018-09-25 DIAGNOSIS — G309 Alzheimer's disease, unspecified: Secondary | ICD-10-CM | POA: Diagnosis not present

## 2018-09-25 DIAGNOSIS — R41841 Cognitive communication deficit: Secondary | ICD-10-CM | POA: Diagnosis not present

## 2018-09-25 DIAGNOSIS — M6281 Muscle weakness (generalized): Secondary | ICD-10-CM | POA: Diagnosis not present

## 2018-09-25 DIAGNOSIS — R278 Other lack of coordination: Secondary | ICD-10-CM | POA: Diagnosis not present

## 2018-09-25 DIAGNOSIS — F331 Major depressive disorder, recurrent, moderate: Secondary | ICD-10-CM | POA: Diagnosis not present

## 2018-09-26 DIAGNOSIS — E785 Hyperlipidemia, unspecified: Secondary | ICD-10-CM | POA: Diagnosis not present

## 2018-09-28 DIAGNOSIS — R41841 Cognitive communication deficit: Secondary | ICD-10-CM | POA: Diagnosis not present

## 2018-09-28 DIAGNOSIS — G309 Alzheimer's disease, unspecified: Secondary | ICD-10-CM | POA: Diagnosis not present

## 2018-09-28 DIAGNOSIS — R278 Other lack of coordination: Secondary | ICD-10-CM | POA: Diagnosis not present

## 2018-09-28 DIAGNOSIS — M6281 Muscle weakness (generalized): Secondary | ICD-10-CM | POA: Diagnosis not present

## 2018-09-28 DIAGNOSIS — R413 Other amnesia: Secondary | ICD-10-CM | POA: Diagnosis not present

## 2018-09-29 DIAGNOSIS — M6281 Muscle weakness (generalized): Secondary | ICD-10-CM | POA: Diagnosis not present

## 2018-09-29 DIAGNOSIS — R278 Other lack of coordination: Secondary | ICD-10-CM | POA: Diagnosis not present

## 2018-10-02 DIAGNOSIS — R278 Other lack of coordination: Secondary | ICD-10-CM | POA: Diagnosis not present

## 2018-10-02 DIAGNOSIS — M6281 Muscle weakness (generalized): Secondary | ICD-10-CM | POA: Diagnosis not present

## 2018-10-09 DIAGNOSIS — L603 Nail dystrophy: Secondary | ICD-10-CM | POA: Diagnosis not present

## 2018-10-09 DIAGNOSIS — I739 Peripheral vascular disease, unspecified: Secondary | ICD-10-CM | POA: Diagnosis not present

## 2018-10-10 DIAGNOSIS — F0281 Dementia in other diseases classified elsewhere with behavioral disturbance: Secondary | ICD-10-CM | POA: Diagnosis not present

## 2018-10-10 DIAGNOSIS — F331 Major depressive disorder, recurrent, moderate: Secondary | ICD-10-CM | POA: Diagnosis not present

## 2018-10-10 DIAGNOSIS — G301 Alzheimer's disease with late onset: Secondary | ICD-10-CM | POA: Diagnosis not present

## 2018-10-10 DIAGNOSIS — R413 Other amnesia: Secondary | ICD-10-CM | POA: Diagnosis not present

## 2018-10-17 ENCOUNTER — Ambulatory Visit (INDEPENDENT_AMBULATORY_CARE_PROVIDER_SITE_OTHER): Payer: Medicare Other | Admitting: Adult Health

## 2018-10-17 ENCOUNTER — Encounter: Payer: Self-pay | Admitting: Adult Health

## 2018-10-17 VITALS — BP 136/76 | HR 72 | Ht 66.0 in | Wt 124.0 lb

## 2018-10-17 DIAGNOSIS — G301 Alzheimer's disease with late onset: Secondary | ICD-10-CM | POA: Diagnosis not present

## 2018-10-17 DIAGNOSIS — F0280 Dementia in other diseases classified elsewhere without behavioral disturbance: Secondary | ICD-10-CM

## 2018-10-17 NOTE — Progress Notes (Signed)
I have read the note, and I agree with the clinical assessment and plan.  Charlene Duncan   

## 2018-10-17 NOTE — Patient Instructions (Signed)
Your Plan:  Continue Aricept Memory score is slightly decreased If your symptoms worsen or you develop new symptoms please let us know.   Thank you for coming to see [place] at [facility]. I hope we have been able to provide you high quality care today.  You may receive a patient satisfaction survey over the next few weeks. We would appreciate your feedback and comments so that we may continue to improve ourselves and the health of our patients.

## 2018-10-17 NOTE — Progress Notes (Signed)
PATIENT: Charlene Duncan DOB: 1936-02-12  REASON FOR VISIT: follow up HISTORY FROM: patient  HISTORY OF PRESENT ILLNESS: Today 10/17/18 Charlene Duncan is an 82 year old female with a history of memory disturbance.  She returns today for follow-up.  She continues to live at [facility].  She requires assistance with ADLs.  This is mainly due to her mobility and balance.  She denies any trouble sleeping.  Her son reports that sometimes she gets her nights and days mixed up.  She reports good appetite.  No change in mood or behavior.  Denies any hallucinations however the son reports that she treats her baby dolls as if they are real babies.  Patient returns today for evaluation  HISTORY 04/12/18 Charlene Duncan is an 82 year old female with a history of progressive memory disturbance.  She returns today for follow-up.  She continues to live at [facility].  She is able to complete all ADLs independently.  She does have some supervision with showering.  The patient no longer operates a motor vehicle.  She does have some trouble sleeping.  She reports that she tends to be up a lot at night but does sleep late in the mornings.  Her medication list indicates that she received Ativan around 2 PM scheduled.  Her son reports that there was some issue of minimal agitation but he has not noticed this recently.  The son is unsure if she is sleeping a lot during the day.  Patient remains on Aricept 10 mg daily.  She returns today for an evaluation.  REVIEW OF SYSTEMS: Out of a complete 14 system review of symptoms, the patient complains only of the following symptoms, and all other reviewed systems are negative.  Joint pain, walking difficulty, memory loss  ALLERGIES: Allergies  Allergen Reactions  . Naproxen Hives    HOME MEDICATIONS: Outpatient Medications Prior to Visit  Medication Sig Dispense Refill  . aspirin EC 81 MG tablet Take 81 mg daily by mouth.    [name] acetaminophen (TYLENOL) 325 MG  tablet Take 2 tablets (650 mg total) by mouth every 6 (six) hours as needed for mild pain (or Fever >/= 101). (Patient taking differently: Take 325 mg by mouth every 4 (four) hours as needed for mild pain (or Fever >/= 101). )    . acetaminophen (TYLENOL) 500 MG tablet Take 500 mg by mouth 3 (three) times daily.    [name] amLODipine (NORVASC) 2.5 MG tablet Take 1 tablet (2.5 mg total) by mouth daily. (Patient not taking: Reported on 09/11/2017) 30 tablet 0  . cephALEXin (KEFLEX) 500 MG capsule Take 1 capsule (500 mg total) by mouth 3 (three) times daily. 20 capsule 0  . Cholecalciferol (VITAMIN D PO) Take 1,000 Units by mouth daily.     . diclofenac sodium (VOLTAREN) 1 % GEL Apply 2 g topically 2 (two) times daily.    [name] donepezil (ARICEPT) 5 MG tablet Take 1 tablet (5 mg total) at bedtime by mouth. (Patient taking differently: Take 10 mg by mouth at bedtime. ) 30 tablet 11  . DULoxetine (CYMBALTA) 60 MG capsule Take 60 mg by mouth daily.     . folic acid (FOLVITE) 1 MG tablet Take 1 mg by mouth daily.    . furosemide (LASIX) 20 MG tablet Take 1 tablet (20 mg total) by mouth daily. (Patient not taking: Reported on 04/12/2018) 10 tablet 0  . levothyroxine (SYNTHROID, LEVOTHROID) 88 MCG tablet Take 88 mcg by mouth every other day.     [name]  LORazepam (ATIVAN) 0.5 MG tablet Take 0.5 mg by mouth See admin instructions. Daily at 2 pm    . Melatonin 3 MG CAPS Take 3 mg by mouth at bedtime.    . methotrexate 2.5 MG tablet Take 15 mg by mouth once a week.     . metoprolol tartrate (LOPRESSOR) 25 MG tablet 12.5 mg daily.    . potassium chloride (K-DUR) 10 MEQ tablet Take 1 tablet (10 mEq total) by mouth daily. (Patient not taking: Reported on 10/18/2017) 10 tablet 0  . traMADol (ULTRAM) 50 MG tablet Take 1 tablet (50 mg total) by mouth every 6 (six) hours as needed for severe pain. (Patient not taking: Reported on 04/12/2018) 60 tablet 0  . vitamin B-12 (CYANOCOBALAMIN) 500 MCG tablet Take 500 mcg by mouth daily.      No facility-administered medications prior to visit.     PAST MEDICAL HISTORY: Past Medical History:  Diagnosis Date  . Alzheimer disease ([place]) 05/16/2017  . Anemia   . Breast CA ([place])   . Cancer ([facility])    breast  . High cholesterol   . HTN (hypertension) 08/10/2017  . Iron deficiency   . Memory loss   . Meniere disease   . Multiple falls   . Myalgia   . OA (osteoarthritis) of knee   . Peripheral edema   . RA (rheumatoid arthritis) (HCC)    Dr. Trudie Reed  . Thyroid disease    hypothyroid  . Vitamin B 12 deficiency     PAST SURGICAL HISTORY: Past Surgical History:  Procedure Laterality Date  . BREAST LUMPECTOMY    . CATARACT EXTRACTION      FAMILY HISTORY: Family History  Problem Relation Age of Onset  . Arthritis Mother   . Diabetes Mother   . Cirrhosis Sister   . Cirrhosis Brother     SOCIAL HISTORY: Social History   Socioeconomic History  . Marital status: Married    Spouse name: Not on file  . Number of children: 3  . Years of education: 9  . Highest education level: Not on file  Occupational History  . Not on file  Social Needs  . Financial resource strain: Not on file  . Food insecurity:    Worry: Not on file    Inability: Not on file  . Transportation needs:    Medical: Not on file    Non-medical: Not on file  Tobacco Use  . Smoking status: Never Smoker  . Smokeless tobacco: Never Used  Substance and Sexual Activity  . Alcohol use: No  . Drug use: No  . Sexual activity: Not on file  Lifestyle  . Physical activity:    Days per week: Not on file    Minutes per session: Not on file  . Stress: Not on file  Relationships  . Social connections:    Talks on phone: Not on file    Gets together: Not on file    Attends religious service: Not on file    Active member of club or organization: Not on file    Attends meetings of clubs or organizations: Not on file    Relationship status: Not on file  . Intimate partner violence:    Fear of  current or ex partner: Not on file    Emotionally abused: Not on file    Physically abused: Not on file    Forced sexual activity: Not on file  Other Topics Concern  . Not on file  Social History  Narrative   Lives alone   Caffeine use: Drinks coffee (daily)/hot chocolate   Left handed      PHYSICAL EXAM  Vitals:   10/17/18 1052  BP: 136/76  Pulse: 72  Weight: 124 lb (56.2 kg)  Height: 5\' 6"  (1.676 m)   Body mass index is 20.01 kg/m.   MMSE - Mini Mental State Exam 10/17/2018 04/12/2018 10/18/2017  Orientation to time 0 1 0  Orientation to Place 1 1 3   Registration 3 3 3   Attention/ Calculation 0 0 0  Recall 0 0 0  Language- name 2 objects 2 2 2   Language- repeat 0 1 1  Language- follow 3 step command 3 3 1   Language- follow 3 step command-comments - - folded twice, handed to RN  Language- read & follow direction 1 1 1   Write a sentence 0 0 1  Copy design 0 0 0  Total score 10 12 12      Generalized: Well developed, in no acute distress   Neurological examination  Mentation: Alert. Follows all commands speech and language fluent Cranial nerve II-XII: Pupils were equal round reactive to light. Extraocular movements were full, visual field were full on confrontational test. Facial sensation and strength were normal. Uvula tongue midline. Head turning and shoulder shrug  were normal and symmetric. Motor: The motor testing reveals 5 over 5 strength of all 4 extremities. Good symmetric motor tone is noted throughout.  Sensory: Sensory testing is intact to soft touch on all 4 extremities. No evidence of extinction is noted.  Coordination: Cerebellar testing reveals good finger-nose-finger and heel-to-shin bilaterally.  Gait and station: Gait is normal.  Reflexes: Deep tendon reflexes are symmetric and normal bilaterally.   DIAGNOSTIC DATA (LABS, IMAGING, TESTING) - I reviewed patient records, labs, notes, testing and imaging myself where available.  Lab Results   Component Value Date   WBC 8.1 07/13/2018   HGB 11.0 (L) 07/13/2018   HCT 34.4 (L) 07/13/2018   MCV 105.8 (H) 07/13/2018   PLT 405 (H) 07/13/2018      Component Value Date/Time   NA 140 07/13/2018 1843   K 3.8 07/13/2018 1843   CL 101 07/13/2018 1843   CO2 29 07/13/2018 1843   GLUCOSE 131 (H) 07/13/2018 1843   BUN 22 07/13/2018 1843   CREATININE 0.88 07/13/2018 1843   CALCIUM 9.5 07/13/2018 1843   PROT 5.9 (L) 08/03/2017 0356   ALBUMIN 3.0 (L) 08/03/2017 0356   AST 25 08/03/2017 0356   ALT 19 08/03/2017 0356   ALKPHOS 51 08/03/2017 0356   BILITOT 0.7 08/03/2017 0356   GFRNONAA >60 07/13/2018 1843   GFRAA >60 07/13/2018 1843      ASSESSMENT AND PLAN 82 y.o. year old female  has a past medical history of Alzheimer disease ([place]) (05/16/2017), Anemia, Breast CA ([place]), Cancer ([place]), High cholesterol, HTN (hypertension) (08/10/2017), Iron deficiency, Memory loss, Meniere disease, Multiple falls, Myalgia, OA (osteoarthritis) of knee, Peripheral edema, RA (rheumatoid arthritis) ([place]), Thyroid disease, and Vitamin B 12 deficiency. here with:  1.  Memory disturbance  The patient has memory score has declined slightly.  She will continue on Aricept.  I have advised that if her symptoms worsen or she develops new symptoms she should let us know.  She will follow-up in 6 months or sooner if needed  I spent 15 minutes with the patient. 50% of this time was spent reviewing memory score   Ward Givens, MSN, NP-C 10/17/2018, 11:03 AM [facility] [address] [place], [place] [address] [phone]

## 2018-10-23 DIAGNOSIS — R768 Other specified abnormal immunological findings in serum: Secondary | ICD-10-CM | POA: Diagnosis not present

## 2018-10-23 DIAGNOSIS — M2550 Pain in unspecified joint: Secondary | ICD-10-CM | POA: Diagnosis not present

## 2018-10-23 DIAGNOSIS — R636 Underweight: Secondary | ICD-10-CM | POA: Diagnosis not present

## 2018-10-23 DIAGNOSIS — M0609 Rheumatoid arthritis without rheumatoid factor, multiple sites: Secondary | ICD-10-CM | POA: Diagnosis not present

## 2018-10-23 DIAGNOSIS — Z681 Body mass index (BMI) 19 or less, adult: Secondary | ICD-10-CM | POA: Diagnosis not present

## 2018-10-23 DIAGNOSIS — Z79899 Other long term (current) drug therapy: Secondary | ICD-10-CM | POA: Diagnosis not present

## 2018-10-24 DIAGNOSIS — E039 Hypothyroidism, unspecified: Secondary | ICD-10-CM | POA: Diagnosis not present

## 2018-10-24 DIAGNOSIS — I10 Essential (primary) hypertension: Secondary | ICD-10-CM | POA: Diagnosis not present

## 2018-10-24 DIAGNOSIS — G8929 Other chronic pain: Secondary | ICD-10-CM | POA: Diagnosis not present

## 2018-10-24 DIAGNOSIS — N6310 Unspecified lump in the right breast, unspecified quadrant: Secondary | ICD-10-CM | POA: Diagnosis not present

## 2018-10-24 DIAGNOSIS — N390 Urinary tract infection, site not specified: Secondary | ICD-10-CM | POA: Diagnosis not present

## 2018-10-24 DIAGNOSIS — E785 Hyperlipidemia, unspecified: Secondary | ICD-10-CM | POA: Diagnosis not present

## 2018-10-24 DIAGNOSIS — R600 Localized edema: Secondary | ICD-10-CM | POA: Diagnosis not present

## 2018-10-24 DIAGNOSIS — K5900 Constipation, unspecified: Secondary | ICD-10-CM | POA: Diagnosis not present

## 2018-10-24 DIAGNOSIS — I4891 Unspecified atrial fibrillation: Secondary | ICD-10-CM | POA: Diagnosis not present

## 2018-10-24 DIAGNOSIS — J111 Influenza due to unidentified influenza virus with other respiratory manifestations: Secondary | ICD-10-CM | POA: Diagnosis not present

## 2018-10-24 DIAGNOSIS — M25569 Pain in unspecified knee: Secondary | ICD-10-CM | POA: Diagnosis not present

## 2018-10-24 DIAGNOSIS — M05611 Rheumatoid arthritis of right shoulder with involvement of other organs and systems: Secondary | ICD-10-CM | POA: Diagnosis not present

## 2018-10-30 DIAGNOSIS — M170 Bilateral primary osteoarthritis of knee: Secondary | ICD-10-CM | POA: Diagnosis not present

## 2018-11-03 DIAGNOSIS — F331 Major depressive disorder, recurrent, moderate: Secondary | ICD-10-CM | POA: Diagnosis not present

## 2018-11-03 DIAGNOSIS — F0281 Dementia in other diseases classified elsewhere with behavioral disturbance: Secondary | ICD-10-CM | POA: Diagnosis not present

## 2018-11-07 DIAGNOSIS — F331 Major depressive disorder, recurrent, moderate: Secondary | ICD-10-CM | POA: Diagnosis not present

## 2018-11-07 DIAGNOSIS — F0281 Dementia in other diseases classified elsewhere with behavioral disturbance: Secondary | ICD-10-CM | POA: Diagnosis not present

## 2018-11-07 DIAGNOSIS — M170 Bilateral primary osteoarthritis of knee: Secondary | ICD-10-CM | POA: Diagnosis not present

## 2018-11-07 DIAGNOSIS — G301 Alzheimer's disease with late onset: Secondary | ICD-10-CM | POA: Diagnosis not present

## 2018-11-13 DIAGNOSIS — M170 Bilateral primary osteoarthritis of knee: Secondary | ICD-10-CM | POA: Diagnosis not present

## 2018-11-14 DIAGNOSIS — F0281 Dementia in other diseases classified elsewhere with behavioral disturbance: Secondary | ICD-10-CM | POA: Diagnosis not present

## 2018-11-14 DIAGNOSIS — F331 Major depressive disorder, recurrent, moderate: Secondary | ICD-10-CM | POA: Diagnosis not present

## 2018-11-21 DIAGNOSIS — I10 Essential (primary) hypertension: Secondary | ICD-10-CM | POA: Diagnosis not present

## 2018-11-21 DIAGNOSIS — K5900 Constipation, unspecified: Secondary | ICD-10-CM | POA: Diagnosis not present

## 2018-11-21 DIAGNOSIS — J111 Influenza due to unidentified influenza virus with other respiratory manifestations: Secondary | ICD-10-CM | POA: Diagnosis not present

## 2018-11-21 DIAGNOSIS — R600 Localized edema: Secondary | ICD-10-CM | POA: Diagnosis not present

## 2018-11-21 DIAGNOSIS — E785 Hyperlipidemia, unspecified: Secondary | ICD-10-CM | POA: Diagnosis not present

## 2018-11-21 DIAGNOSIS — I4891 Unspecified atrial fibrillation: Secondary | ICD-10-CM | POA: Diagnosis not present

## 2018-11-21 DIAGNOSIS — E039 Hypothyroidism, unspecified: Secondary | ICD-10-CM | POA: Diagnosis not present

## 2018-11-21 DIAGNOSIS — N390 Urinary tract infection, site not specified: Secondary | ICD-10-CM | POA: Diagnosis not present

## 2018-11-21 DIAGNOSIS — M25569 Pain in unspecified knee: Secondary | ICD-10-CM | POA: Diagnosis not present

## 2018-11-21 DIAGNOSIS — M05611 Rheumatoid arthritis of right shoulder with involvement of other organs and systems: Secondary | ICD-10-CM | POA: Diagnosis not present

## 2018-11-21 DIAGNOSIS — G8929 Other chronic pain: Secondary | ICD-10-CM | POA: Diagnosis not present

## 2018-11-21 DIAGNOSIS — N6310 Unspecified lump in the right breast, unspecified quadrant: Secondary | ICD-10-CM | POA: Diagnosis not present

## 2018-11-24 DIAGNOSIS — F331 Major depressive disorder, recurrent, moderate: Secondary | ICD-10-CM | POA: Diagnosis not present

## 2018-11-24 DIAGNOSIS — F0281 Dementia in other diseases classified elsewhere with behavioral disturbance: Secondary | ICD-10-CM | POA: Diagnosis not present

## 2019-06-05 ENCOUNTER — Ambulatory Visit: Payer: Medicare Other | Admitting: Adult Health

## 2019-08-31 IMAGING — CT CT ABD-PELV W/ CM
2 of 5 series · 15 of 46 positions shown, 17 images · IV contrast (APPLIED)
Comparison: 10/08/2016

CLINICAL DATA: Back pain. Known pelvic fracture after falling out
of bed yesterday.

EXAM:
CT ABDOMEN AND PELVIS WITH CONTRAST
TECHNIQUE: Multidetector CT imaging of the abdomen and pelvis was performed
using the standard protocol following bolus administration of
intravenous contrast.
CONTRAST:  100mL 8KW2PB-DBB IOPAMIDOL (8KW2PB-DBB) INJECTION 61%

[Series 8: abdomen 3.0 mpr cor · coronal · 0.60mm/px · 3 of 78 slices shown]
[im 26/78  soft-tissue]
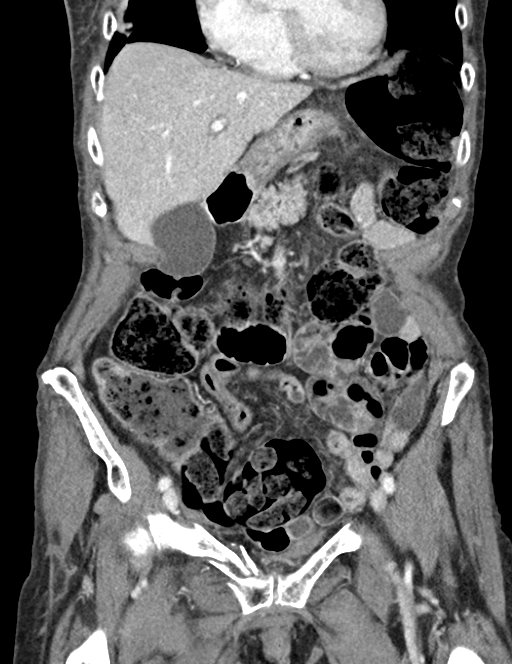
[im 35/78  soft-tissue]
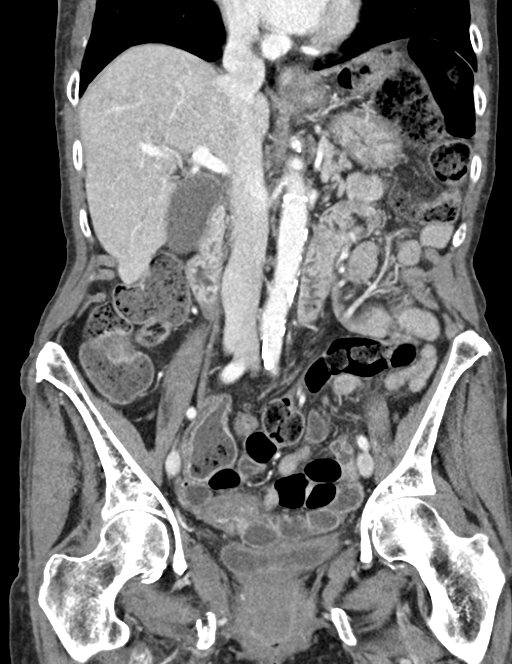
[im 43/78  soft-tissue]
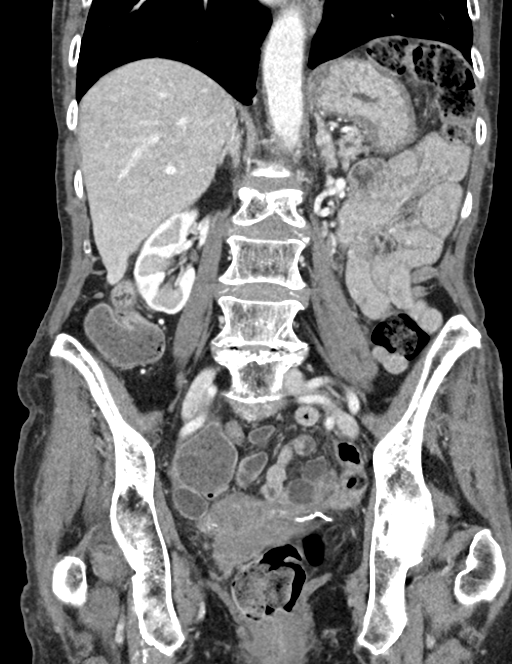

[Series 10: abdomen 5.0 · axial · 0.66mm/px · z∈[-746,-396]mm · 12 of 81 slices shown, 14 images]
[im 6/81  soft-tissue]
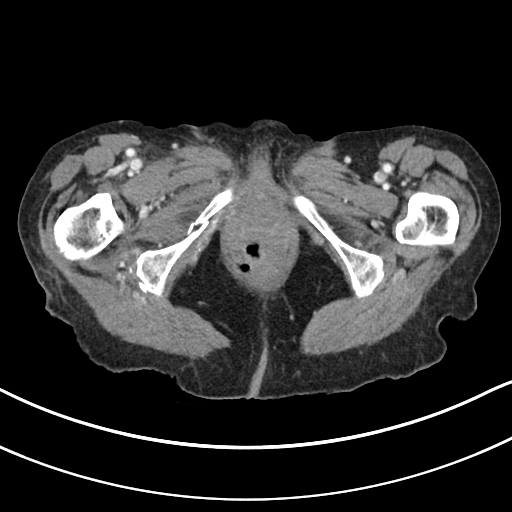
[im 6/81  bone]
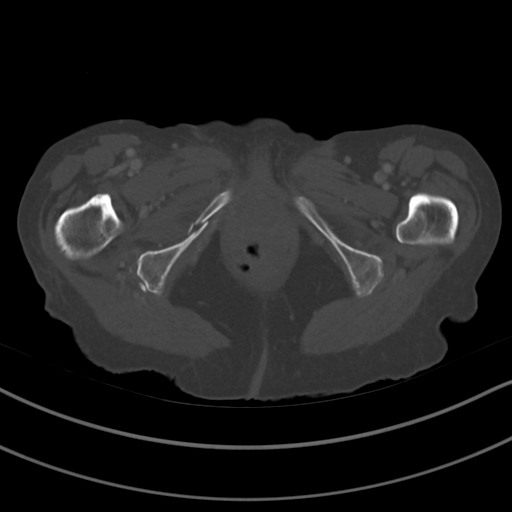
[im 11/81  soft-tissue]
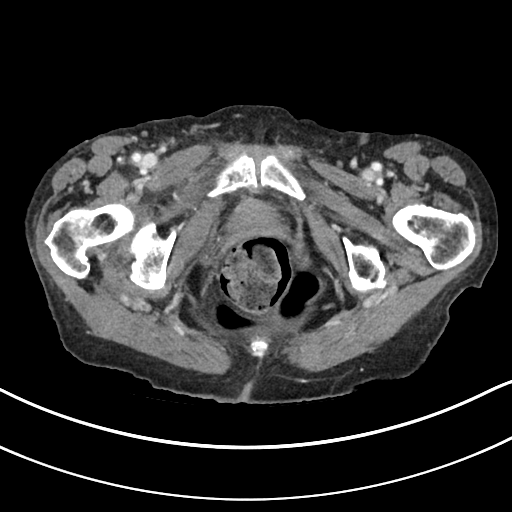
[im 21/81  soft-tissue]
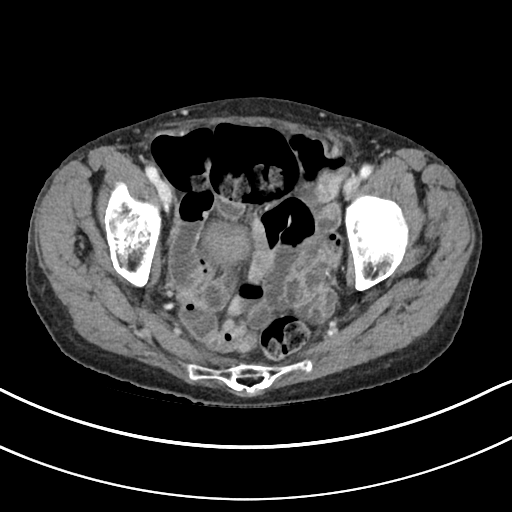
[im 26/81  soft-tissue]
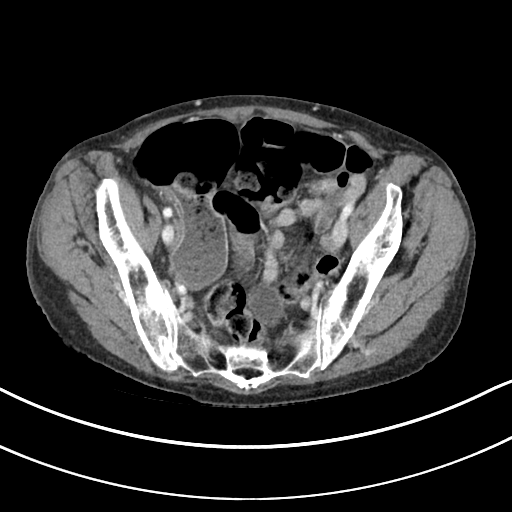
[im 31/81  soft-tissue]
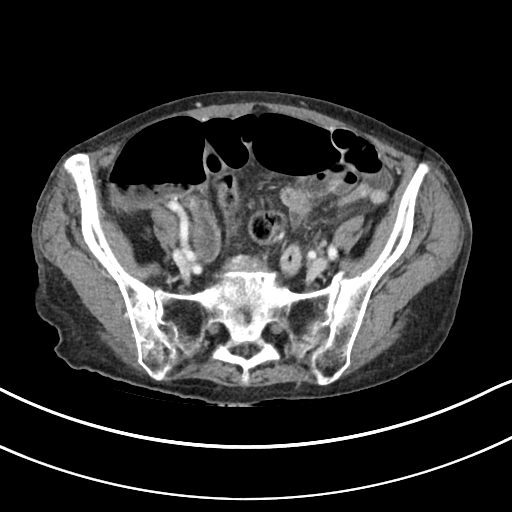
[im 36/81  soft-tissue]
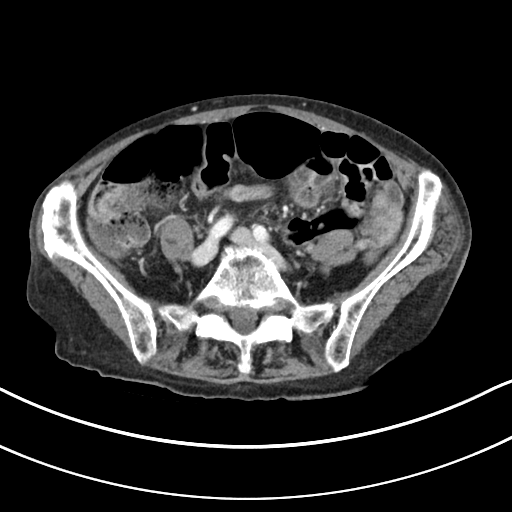
[im 46/81  soft-tissue]
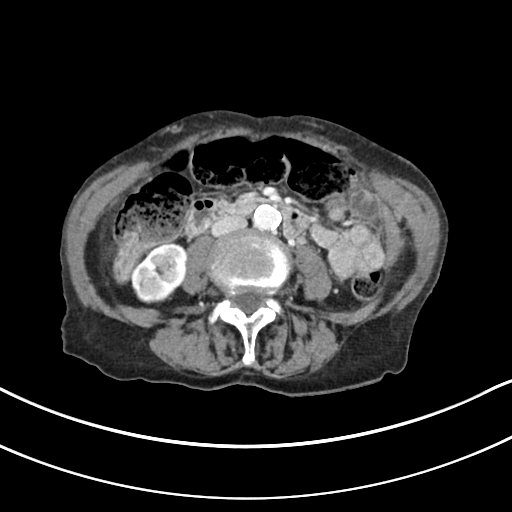
[im 51/81  soft-tissue]
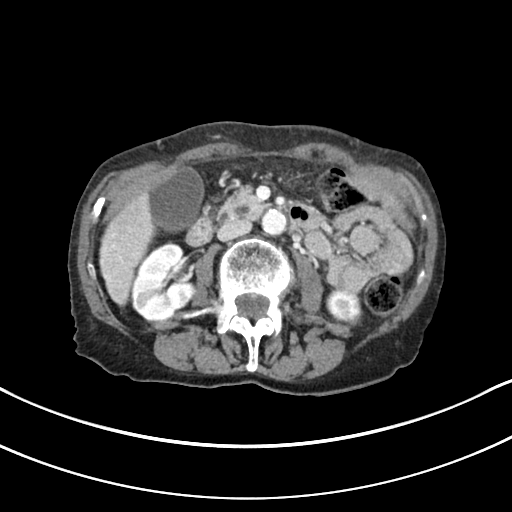
[im 56/81  soft-tissue]
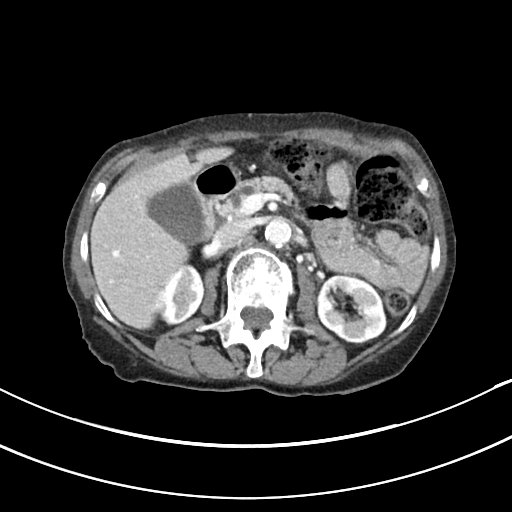
[im 56/81  bone]
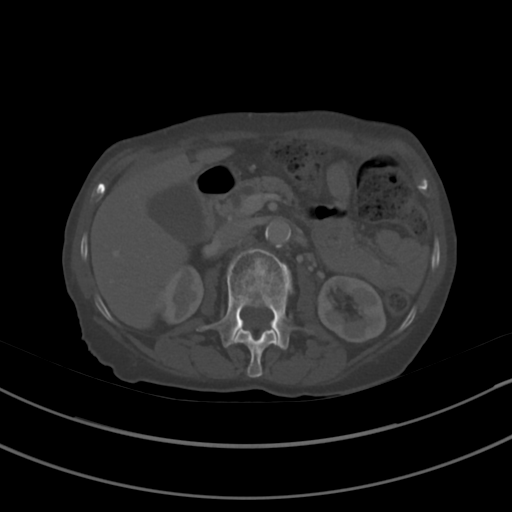
[im 61/81  soft-tissue]
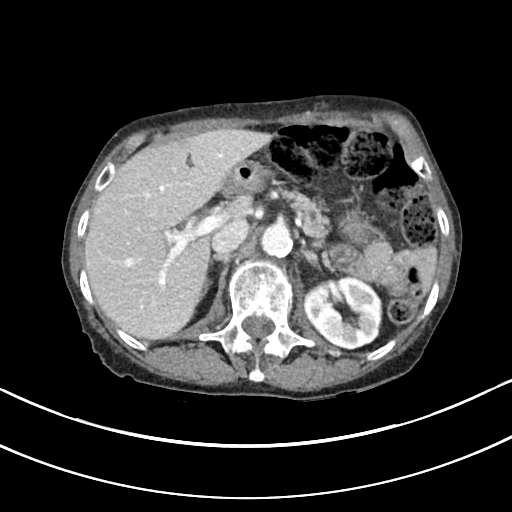
[im 71/81  soft-tissue]
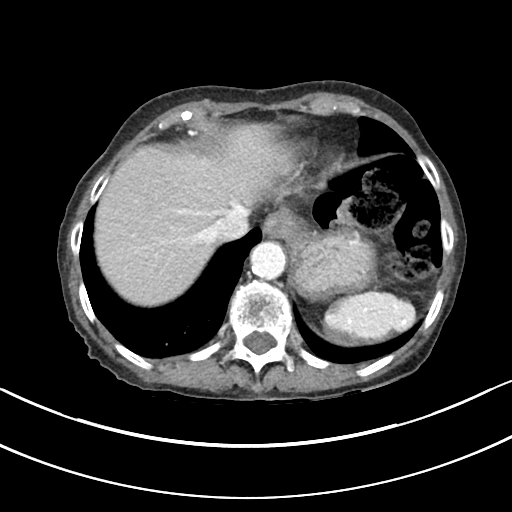
[im 76/81  soft-tissue]
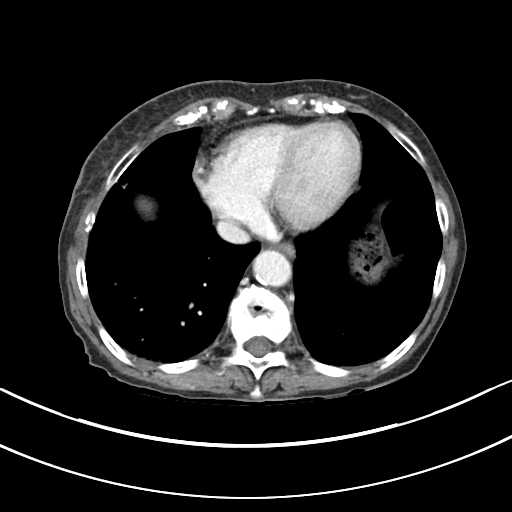

[15 of 46 positions shown; findings below may reference images not displayed]

FINDINGS: Lower chest: Scarring in the lateral right base. No suspicious focal
lung base lesion. No consolidation or effusion.

Hepatobiliary: No focal liver abnormality is seen. No gallstones,
gallbladder wall thickening, or biliary dilatation.

Pancreas: Mildly atrophic with associated ductal dilatation. No
pancreatic parenchymal mass. No inflammation or fluid collection.

Spleen: Normal in size without focal abnormality.

Adrenals/Urinary Tract: Adrenal glands are unremarkable. Kidneys are
normal, without renal calculi, focal lesion, or hydronephrosis.
Bladder is unremarkable.

Stomach/Bowel: Stomach, small bowel and colon are remarkable only
for a generous volume of fluid throughout small bowel and a generous
volume of colonic stool. No evidence of bowel obstruction. No focal
inflammation of bowel. No extraluminal gas.

Vascular/Lymphatic: The abdominal aorta is normal in caliber with
extensive atherosclerotic calcification. No evidence of
intra-abdominal vascular injury. No adenopathy in the abdomen or
pelvis.

Reproductive: Uterus and bilateral adnexa are unremarkable.

Other: Mildly displaced mildly comminuted superior and inferior
right pubic ramus fractures. Visible sacral fracture. No diastases
of the sacroiliac joints or pubic symphysis. Small pelvic sidewall
intramuscular hemorrhage, with mild thickening of the right
obturator internus. No drainable hematoma.

Musculoskeletal: Multiple compressions of upper lumbar and lower
thoracic vertebrae. The L1 compression appears to be acute. See
accompanying lumbar spine CT for details.

No significant focal bone lesions.
IMPRESSION: 1. Acute fractures of the right pubic rami, mildly comminuted. No
drainable hematoma. No SI or pubic symphysis diastases.
2. Acute L1 vertebral compression. Multiple chronic lumbar and lower
thoracic compressions.
3. Pancreatic atrophy.  No pancreatic mass or inflammation.
4. Aortic atherosclerosis.
5. No intra-abdominal vascular injury. No parenchymal organ injury.
No peritoneal blood or free air.
6. Irregular opacity in the lateral right lung base, likely scarring
although not conclusively characterized. Attention to this area on
follow-up is recommended to assure stability.

## 2019-08-31 IMAGING — CT CT L SPINE W/O CM
3 of 4 series · 12 of 33 positions shown, 14 images · non-contrast
Comparison: Lumbar spine radiographs 10/08/2016. Abdominopelvic CT
performed concurrently.

CLINICAL DATA: Trauma. Lumbosacral fracture. Fall out of bed today
with lumbosacral back pain.

EXAM:
CT LUMBAR SPINE WITHOUT CONTRAST
TECHNIQUE: Multidetector CT imaging of the lumbar spine was performed without
intravenous contrast administration. Multiplanar CT image
reconstructions were also generated. Imaging reformatted from
abdominopelvic CT.

[Series 6: l spine bone. · axial · 0.38mm/px · z∈[-622,-446]mm · 4 of 132 slices shown, 5 images]
[im 22/132  soft-tissue]
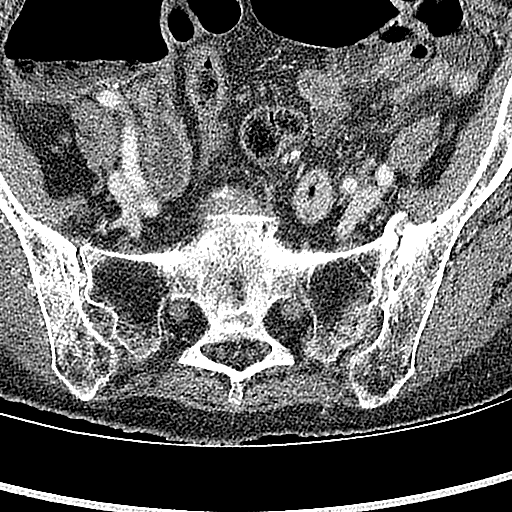
[im 22/132  bone]
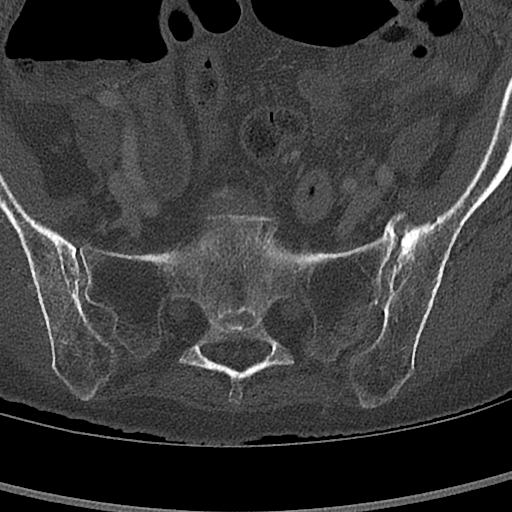
[im 44/132  bone]
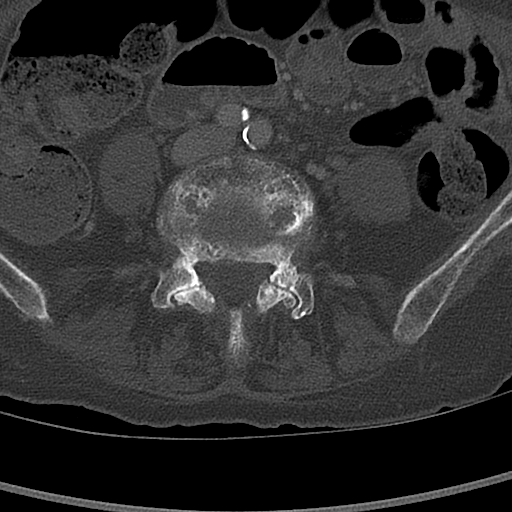
[im 88/132  bone]
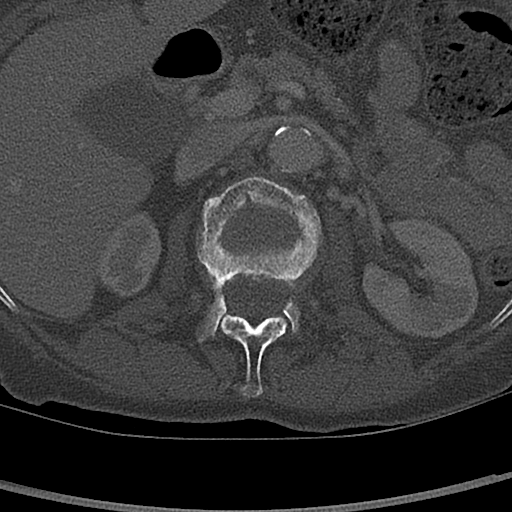
[im 110/132  bone]
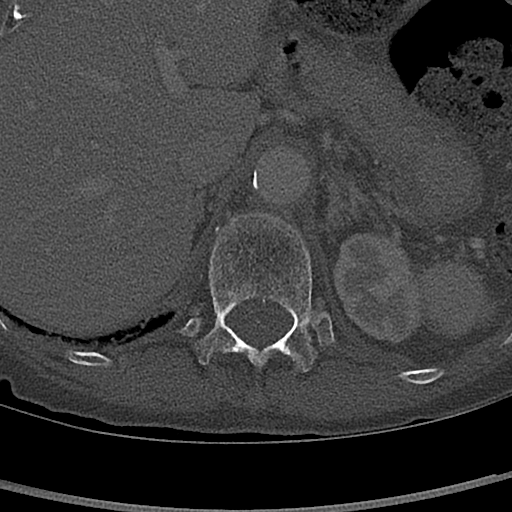

[Series 9: l spine sag · coronal · 0.40mm/px · 3 of 89 slices shown (1 of 2)]
[im 18/89  bone]
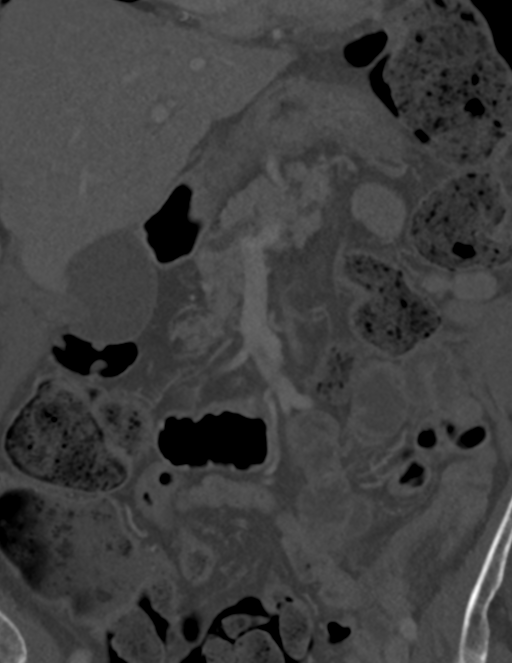
[im 36/89  bone]
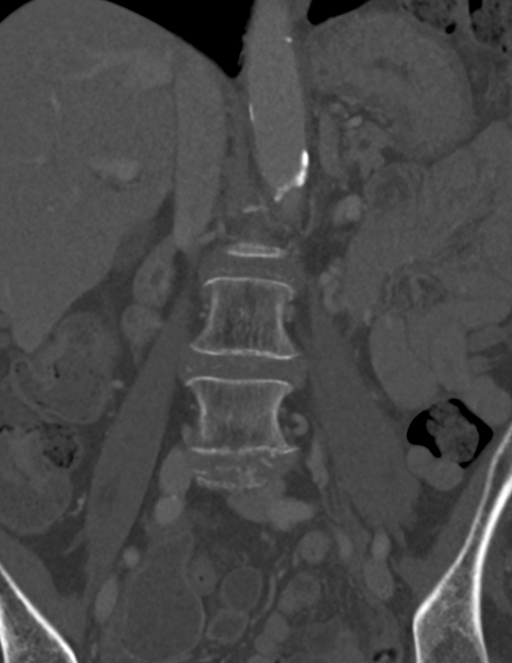
[im 53/89  bone]
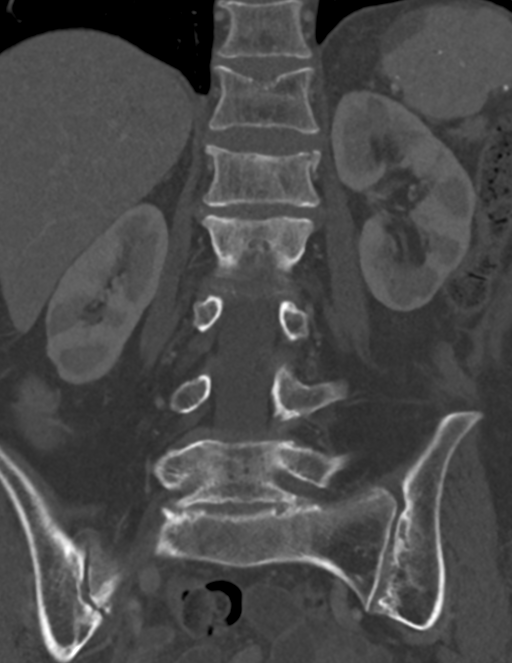

[Series 10: l spine sag · sagittal · 0.35mm/px · 5 of 62 slices shown, 6 images (2 of 2)]
[im 21/62  bone]
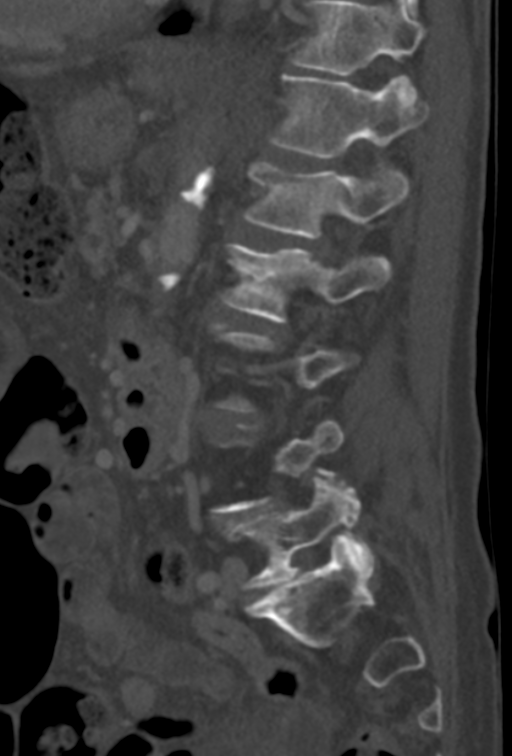
[im 26/62  bone]
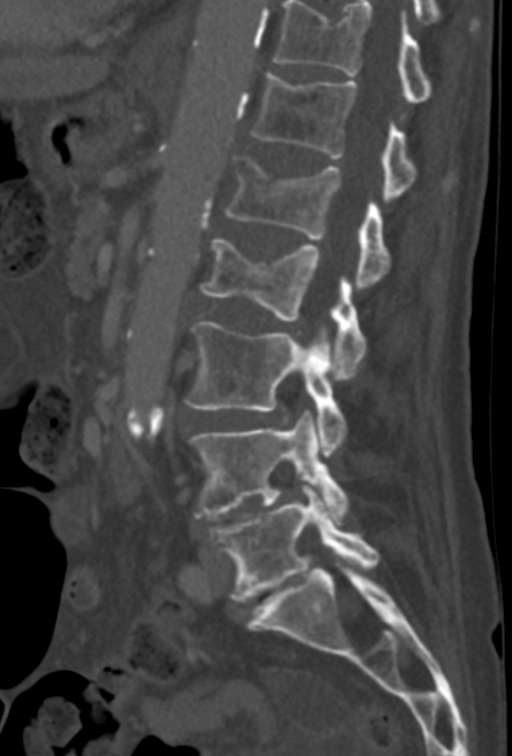
[im 31/62  soft-tissue]
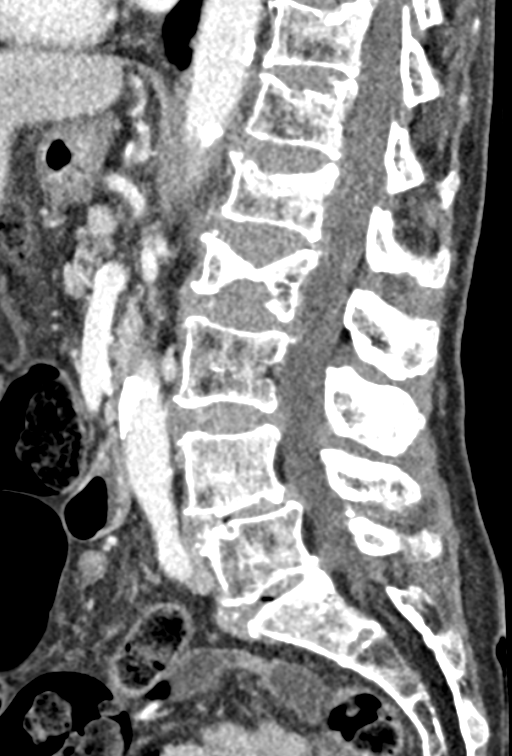
[im 31/62  bone]
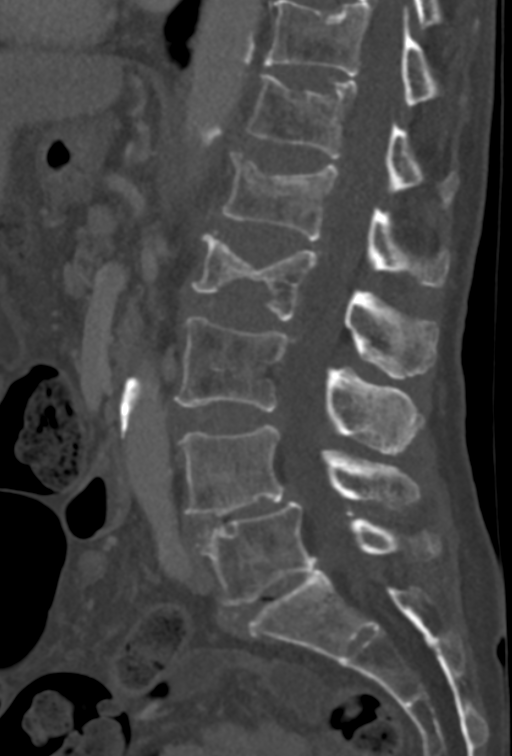
[im 36/62  bone]
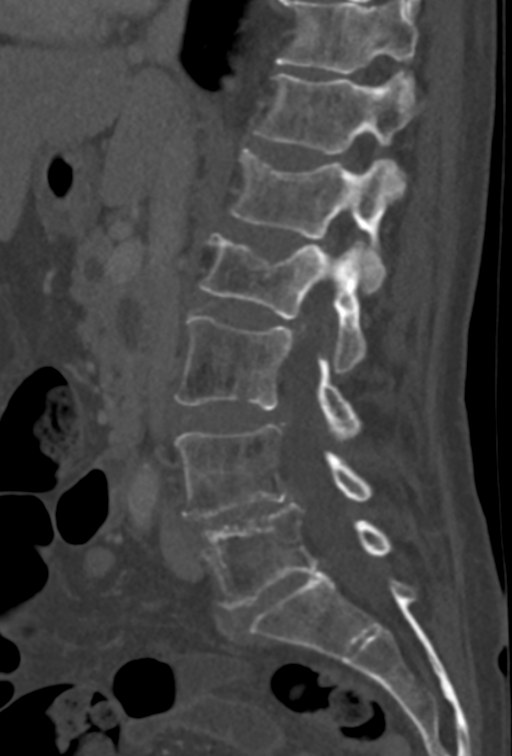
[im 41/62  bone]
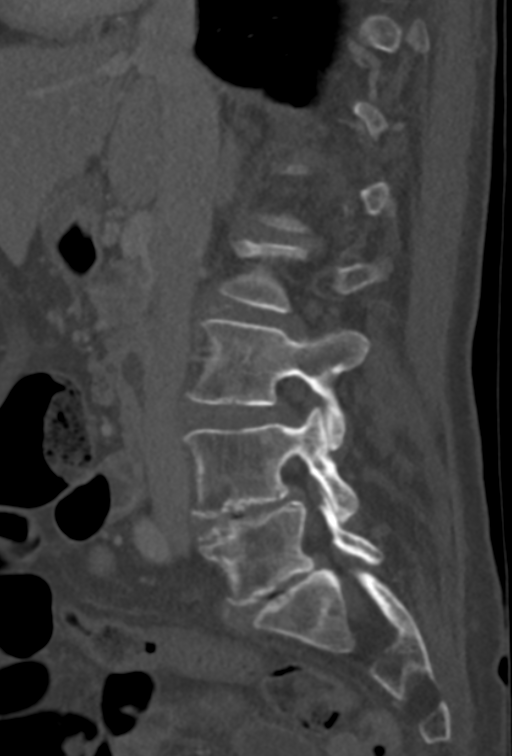

[12 of 33 positions shown; findings below may reference images not displayed]

FINDINGS: Segmentation: 5 lumbar type vertebrae.

Alignment: Chronic grade 1 anterolisthesis of L4 on L5. Alignment is
otherwise maintained.

Vertebrae: Acute compression fracture with approximately 30% loss of
height of superior endplate. Minimal extension of posterior cortex
without significant retropulsion. No posterior element extension.

L2 compression fracture is chronic, however progressed from prior
radiograph with increased loss of height centrally and involving the
inferior endplate. Minimal involvement of the posterior cortex
without retropulsion.

Mild compression fractures superior endplate of T11 and T12 of
unknown acuity, but new from [DATE] September 2016 radiograph. No
posterior cortex involvement. No focal bone lesion.

Paraspinal and other soft tissues: Perispinal musculature is intact
without hematoma. Intra-abdominal structures better assessed on
dedicated abdominal CT.

Disc levels: Disc space narrowing and endplate spurring at L4-L5 and
L5-S1, similar in degree to prior exam. Facet arthropathy at L4-L5.
No narrowing of the spinal canal.
IMPRESSION: 1. Acute L1 compression fracture with approximately 30% loss of
height. Minimal posterior cortex involvement without retropulsion.
2. Moderate L2 compression fracture is chronic, but has progressed
from comparison radiograph September 2016. Progressive loss of
height centrally with new inferior endplate component.
3. Mild T11 and T12 superior endplate compression fractures, new
from prior radiograph but age indeterminate.
